# Patient Record
Sex: Female | Born: 1940
Health system: Southern US, Community
[De-identification: ages and names within clinical notes are randomized; demographics above are authoritative.]

## PROBLEM LIST (undated history)

## (undated) DIAGNOSIS — Z803 Family history of malignant neoplasm of breast: Secondary | ICD-10-CM

## (undated) DIAGNOSIS — R0600 Dyspnea, unspecified: Secondary | ICD-10-CM

## (undated) DIAGNOSIS — IMO0001 Reserved for inherently not codable concepts without codable children: Secondary | ICD-10-CM

## (undated) DIAGNOSIS — I10 Essential (primary) hypertension: Secondary | ICD-10-CM

## (undated) DIAGNOSIS — C50919 Malignant neoplasm of unspecified site of unspecified female breast: Secondary | ICD-10-CM

## (undated) DIAGNOSIS — E119 Type 2 diabetes mellitus without complications: Secondary | ICD-10-CM

## (undated) DIAGNOSIS — R519 Headache, unspecified: Secondary | ICD-10-CM

## (undated) DIAGNOSIS — K219 Gastro-esophageal reflux disease without esophagitis: Secondary | ICD-10-CM

## (undated) DIAGNOSIS — Z923 Personal history of irradiation: Secondary | ICD-10-CM

## (undated) DIAGNOSIS — R51 Headache: Secondary | ICD-10-CM

## (undated) DIAGNOSIS — IMO0002 Reserved for concepts with insufficient information to code with codable children: Secondary | ICD-10-CM

## (undated) DIAGNOSIS — C50911 Malignant neoplasm of unspecified site of right female breast: Secondary | ICD-10-CM

## (undated) DIAGNOSIS — Z8489 Family history of other specified conditions: Secondary | ICD-10-CM

## (undated) DIAGNOSIS — R0609 Other forms of dyspnea: Secondary | ICD-10-CM

## (undated) HISTORY — PX: COLONOSCOPY: SHX174

## (undated) HISTORY — DX: Essential (primary) hypertension: I10

## (undated) HISTORY — PX: BREAST LUMPECTOMY: SHX2

## (undated) HISTORY — DX: Malignant neoplasm of unspecified site of unspecified female breast: C50.919

## (undated) HISTORY — DX: Family history of malignant neoplasm of breast: Z80.3

## (undated) HISTORY — DX: Reserved for inherently not codable concepts without codable children: IMO0001

## (undated) HISTORY — DX: Dyspnea, unspecified: R06.00

## (undated) HISTORY — PX: BREAST BIOPSY: SHX20

## (undated) HISTORY — DX: Other forms of dyspnea: R06.09

## (undated) HISTORY — DX: Type 2 diabetes mellitus without complications: E11.9

## (undated) HISTORY — DX: Reserved for concepts with insufficient information to code with codable children: IMO0002

---

## 1972-04-21 HISTORY — PX: APPENDECTOMY: SHX54

## 1972-04-21 HISTORY — PX: UMBILICAL HERNIA REPAIR: SHX196

## 1981-04-21 HISTORY — PX: PARTIAL HYSTERECTOMY: SHX80

## 2000-04-21 HISTORY — PX: BREAST SURGERY: SHX581

## 2000-11-05 ENCOUNTER — Encounter: Payer: Self-pay | Admitting: Family Medicine

## 2000-11-05 ENCOUNTER — Encounter: Admission: RE | Admit: 2000-11-05 | Discharge: 2000-11-05 | Payer: Self-pay | Admitting: Family Medicine

## 2000-11-11 ENCOUNTER — Encounter: Payer: Self-pay | Admitting: Family Medicine

## 2000-11-11 ENCOUNTER — Encounter: Admission: RE | Admit: 2000-11-11 | Discharge: 2000-11-11 | Payer: Self-pay | Admitting: Family Medicine

## 2000-11-19 ENCOUNTER — Encounter (INDEPENDENT_AMBULATORY_CARE_PROVIDER_SITE_OTHER): Payer: Self-pay | Admitting: *Deleted

## 2000-11-19 ENCOUNTER — Encounter: Admission: RE | Admit: 2000-11-19 | Discharge: 2000-11-19 | Payer: Self-pay | Admitting: General Surgery

## 2000-11-19 ENCOUNTER — Ambulatory Visit (HOSPITAL_BASED_OUTPATIENT_CLINIC_OR_DEPARTMENT_OTHER): Admission: RE | Admit: 2000-11-19 | Discharge: 2000-11-19 | Payer: Self-pay | Admitting: General Surgery

## 2000-11-19 ENCOUNTER — Encounter: Payer: Self-pay | Admitting: General Surgery

## 2000-12-18 ENCOUNTER — Encounter (INDEPENDENT_AMBULATORY_CARE_PROVIDER_SITE_OTHER): Payer: Self-pay | Admitting: *Deleted

## 2000-12-18 ENCOUNTER — Ambulatory Visit (HOSPITAL_BASED_OUTPATIENT_CLINIC_OR_DEPARTMENT_OTHER): Admission: RE | Admit: 2000-12-18 | Discharge: 2000-12-18 | Payer: Self-pay | Admitting: General Surgery

## 2001-01-01 ENCOUNTER — Ambulatory Visit: Admission: RE | Admit: 2001-01-01 | Discharge: 2001-04-01 | Payer: Self-pay | Admitting: Radiation Oncology

## 2001-08-20 ENCOUNTER — Encounter: Admission: RE | Admit: 2001-08-20 | Discharge: 2001-08-20 | Payer: Self-pay | Admitting: General Surgery

## 2001-08-20 ENCOUNTER — Encounter: Payer: Self-pay | Admitting: General Surgery

## 2002-05-17 ENCOUNTER — Encounter: Payer: Self-pay | Admitting: General Surgery

## 2002-05-17 ENCOUNTER — Encounter: Admission: RE | Admit: 2002-05-17 | Discharge: 2002-05-17 | Payer: Self-pay | Admitting: General Surgery

## 2002-07-27 ENCOUNTER — Ambulatory Visit: Admission: RE | Admit: 2002-07-27 | Discharge: 2002-07-27 | Payer: Self-pay | Admitting: Family Medicine

## 2002-09-02 ENCOUNTER — Encounter: Admission: RE | Admit: 2002-09-02 | Discharge: 2002-09-02 | Payer: Self-pay | Admitting: General Surgery

## 2002-09-02 ENCOUNTER — Encounter: Payer: Self-pay | Admitting: General Surgery

## 2002-11-11 ENCOUNTER — Encounter: Admission: RE | Admit: 2002-11-11 | Discharge: 2002-11-11 | Payer: Self-pay | Admitting: Oncology

## 2002-11-11 ENCOUNTER — Encounter: Payer: Self-pay | Admitting: Oncology

## 2003-06-15 ENCOUNTER — Encounter: Admission: RE | Admit: 2003-06-15 | Discharge: 2003-06-15 | Payer: Self-pay | Admitting: General Surgery

## 2004-10-02 ENCOUNTER — Encounter: Admission: RE | Admit: 2004-10-02 | Discharge: 2004-10-02 | Payer: Self-pay | Admitting: General Surgery

## 2004-10-07 ENCOUNTER — Other Ambulatory Visit: Admission: RE | Admit: 2004-10-07 | Discharge: 2004-10-07 | Payer: Self-pay | Admitting: Family Medicine

## 2004-11-11 ENCOUNTER — Encounter: Admission: RE | Admit: 2004-11-11 | Discharge: 2004-11-11 | Payer: Self-pay | Admitting: Family Medicine

## 2006-03-04 ENCOUNTER — Encounter: Admission: RE | Admit: 2006-03-04 | Discharge: 2006-03-04 | Payer: Self-pay | Admitting: Family Medicine

## 2007-01-28 ENCOUNTER — Other Ambulatory Visit: Admission: RE | Admit: 2007-01-28 | Discharge: 2007-01-28 | Payer: Self-pay | Admitting: Family Medicine

## 2007-03-08 ENCOUNTER — Encounter: Admission: RE | Admit: 2007-03-08 | Discharge: 2007-03-08 | Payer: Self-pay | Admitting: Family Medicine

## 2007-04-22 HISTORY — PX: CATARACT EXTRACTION: SUR2

## 2008-03-08 ENCOUNTER — Encounter: Admission: RE | Admit: 2008-03-08 | Discharge: 2008-03-08 | Payer: Self-pay | Admitting: Family Medicine

## 2009-03-13 ENCOUNTER — Encounter: Admission: RE | Admit: 2009-03-13 | Discharge: 2009-03-13 | Payer: Self-pay | Admitting: Family Medicine

## 2010-03-22 ENCOUNTER — Encounter: Admission: RE | Admit: 2010-03-22 | Discharge: 2010-03-22 | Payer: Self-pay | Admitting: Family Medicine

## 2010-09-06 NOTE — Op Note (Signed)
[place]  Patient:    KIANA, HOLLAR Visit Number: [id number] MRN: [id number]          Service Type: DSU Location: [facility] Attending Physician:  Janalyn Rouse Dictated by:   Rose Phi. Maple Hudson, M.D. Proc. Date: 12/18/00 Admit Date:  12/18/2000                             Operative Report  PREOPERATIVE DIAGNOSIS:  Ductal carcinoma in situ of the left breast with a transected margin at 3 oclock position.  POSTOPERATIVE DIAGNOSIS:  Ductal carcinoma in situ of the left breast with a transected margin at 3 oclock position.  PROCEDURE:  Re-excision of biopsy site.  SURGEON: Rose Phi. Maple Hudson, M.D.  ANESTHESIA:  General.  DESCRIPTION OF PROCEDURE:  Patient placed on the operating room table and the left breast prepped and draped in the usual fashion.  The old curvilinear incision in the upper inner quadrant of the left breast was used, extending it somewhat laterally toward the 3 oclock position.  I was then able to excise the previous biopsy site with more tissue on the 3 oclock side, which I then oriented for the pathologist.  We had good hemostasis with the cautery.  I infiltrated the tissue with 0.25% Marcaine.  Subcuticular closure of 4-0 Monocryl and Steri-Strips carried out.  Dressing applied.  Patient transferred to the recovery room in satisfactory condition, having tolerated the procedure well. Dictated by:   Rose Phi. Maple Hudson, M.D. Attending Physician:  Janalyn Rouse DD:  12/18/00 TD:  12/19/00 Job: [id number]

## 2010-09-06 NOTE — Op Note (Signed)
[place]. [facility]  Patient:    Debbie Joseph, Debbie Joseph                           MRN: [id number] Proc. Date: 11/19/00 Attending:  Rose Phi. Maple Hudson, M.D.                           Operative Report  PREOPERATIVE DIAGNOSIS:  High grade ductal carcinoma in situ, left breast.  POSTOPERATIVE DIAGNOSIS:  High grade ductal carcinoma in situ, left breast.  OPERATION PERFORMED:  Left partial mastectomy with needle localization and specimen mammography.  SURGEON:  Rose Phi. Maple Hudson, M.D.  ANESTHESIA:  General.  INDICATIONS FOR PROCEDURE:  The patient had an abnormal mammogram which with microcalcifications, which on core biopsy, showed high grade DCIS without signs of invasion.  She is scheduled now for a wide excision.  DESCRIPTION OF PROCEDURE:  After suitable general anesthesia was induced, the patient was placed in supine position with the left arm extended on the arm board.  The left breast was prepped and draped in the usual fashion.  A curved incision was outlined centered on the previously placed wire in the upper inner quadrant of her left breast and incision was made and a wide excision of the wire and surrounding tissue was carried out.  Specimen mammography confirmed the removal of the previously placed clip and no other abnormalities at the margins.  Hemostasis was obtained with the cautery.  We infiltrated with 0.25% Marcaine.  Subcuticular closure of 4-0 Monocryl and Steri-Strips carried out.  Dressing applied.  The patient was transferred to the recovery room in satisfactory condition having tolerated the procedure well. DD:  11/19/00 TD:  11/19/00 Job: [id number]

## 2011-02-20 ENCOUNTER — Other Ambulatory Visit: Payer: Self-pay | Admitting: Family Medicine

## 2011-02-20 DIAGNOSIS — Z1231 Encounter for screening mammogram for malignant neoplasm of breast: Secondary | ICD-10-CM

## 2011-04-04 ENCOUNTER — Ambulatory Visit
Admission: RE | Admit: 2011-04-04 | Discharge: 2011-04-04 | Disposition: A | Discharge status: Home / Self Care | Payer: Medicare Other | Source: Ambulatory Visit | Attending: Family Medicine | Admitting: Family Medicine

## 2011-04-04 DIAGNOSIS — Z1231 Encounter for screening mammogram for malignant neoplasm of breast: Secondary | ICD-10-CM

## 2011-05-15 DIAGNOSIS — H04129 Dry eye syndrome of unspecified lacrimal gland: Secondary | ICD-10-CM | POA: Diagnosis not present

## 2011-11-04 DIAGNOSIS — H8110 Benign paroxysmal vertigo, unspecified ear: Secondary | ICD-10-CM | POA: Diagnosis not present

## 2011-11-04 DIAGNOSIS — R079 Chest pain, unspecified: Secondary | ICD-10-CM | POA: Diagnosis not present

## 2011-11-04 DIAGNOSIS — I10 Essential (primary) hypertension: Secondary | ICD-10-CM | POA: Diagnosis not present

## 2011-11-04 DIAGNOSIS — E785 Hyperlipidemia, unspecified: Secondary | ICD-10-CM | POA: Diagnosis not present

## 2011-11-20 DIAGNOSIS — I10 Essential (primary) hypertension: Secondary | ICD-10-CM | POA: Diagnosis not present

## 2011-11-20 DIAGNOSIS — E785 Hyperlipidemia, unspecified: Secondary | ICD-10-CM | POA: Diagnosis not present

## 2012-03-29 ENCOUNTER — Other Ambulatory Visit: Payer: Self-pay | Admitting: Family Medicine

## 2012-03-29 DIAGNOSIS — Z1231 Encounter for screening mammogram for malignant neoplasm of breast: Secondary | ICD-10-CM

## 2012-04-07 ENCOUNTER — Ambulatory Visit
Admission: RE | Admit: 2012-04-07 | Discharge: 2012-04-07 | Disposition: A | Discharge status: Home / Self Care | Payer: Medicare Other | Source: Ambulatory Visit | Attending: Family Medicine | Admitting: Family Medicine

## 2012-04-07 DIAGNOSIS — Z1231 Encounter for screening mammogram for malignant neoplasm of breast: Secondary | ICD-10-CM

## 2012-04-07 DIAGNOSIS — Z853 Personal history of malignant neoplasm of breast: Secondary | ICD-10-CM | POA: Diagnosis not present

## 2012-05-20 DIAGNOSIS — H04129 Dry eye syndrome of unspecified lacrimal gland: Secondary | ICD-10-CM | POA: Diagnosis not present

## 2012-06-15 DIAGNOSIS — K219 Gastro-esophageal reflux disease without esophagitis: Secondary | ICD-10-CM | POA: Diagnosis not present

## 2012-06-15 DIAGNOSIS — E785 Hyperlipidemia, unspecified: Secondary | ICD-10-CM | POA: Diagnosis not present

## 2012-06-15 DIAGNOSIS — I10 Essential (primary) hypertension: Secondary | ICD-10-CM | POA: Diagnosis not present

## 2012-06-15 DIAGNOSIS — R05 Cough: Secondary | ICD-10-CM | POA: Diagnosis not present

## 2012-08-27 DIAGNOSIS — R197 Diarrhea, unspecified: Secondary | ICD-10-CM | POA: Diagnosis not present

## 2012-12-02 DIAGNOSIS — I10 Essential (primary) hypertension: Secondary | ICD-10-CM | POA: Diagnosis not present

## 2012-12-02 DIAGNOSIS — Z1211 Encounter for screening for malignant neoplasm of colon: Secondary | ICD-10-CM | POA: Diagnosis not present

## 2012-12-02 DIAGNOSIS — Z0000 Encounter for general adult medical examination without abnormal findings: Secondary | ICD-10-CM | POA: Diagnosis not present

## 2012-12-02 DIAGNOSIS — E785 Hyperlipidemia, unspecified: Secondary | ICD-10-CM | POA: Diagnosis not present

## 2013-03-08 ENCOUNTER — Other Ambulatory Visit: Payer: Self-pay

## 2013-03-08 DIAGNOSIS — Z1231 Encounter for screening mammogram for malignant neoplasm of breast: Secondary | ICD-10-CM

## 2013-04-07 DIAGNOSIS — B9789 Other viral agents as the cause of diseases classified elsewhere: Secondary | ICD-10-CM | POA: Diagnosis not present

## 2013-04-07 DIAGNOSIS — R05 Cough: Secondary | ICD-10-CM | POA: Diagnosis not present

## 2013-04-08 ENCOUNTER — Ambulatory Visit: Payer: Medicare Other

## 2013-04-12 ENCOUNTER — Ambulatory Visit
Admission: RE | Admit: 2013-04-12 | Discharge: 2013-04-12 | Disposition: A | Discharge status: Home / Self Care | Payer: Medicare Other | Source: Ambulatory Visit

## 2013-04-12 DIAGNOSIS — Z1231 Encounter for screening mammogram for malignant neoplasm of breast: Secondary | ICD-10-CM | POA: Diagnosis not present

## 2013-04-21 HISTORY — PX: OTHER SURGICAL HISTORY: SHX169

## 2013-05-23 DIAGNOSIS — H40019 Open angle with borderline findings, low risk, unspecified eye: Secondary | ICD-10-CM | POA: Diagnosis not present

## 2013-05-23 DIAGNOSIS — H04129 Dry eye syndrome of unspecified lacrimal gland: Secondary | ICD-10-CM | POA: Diagnosis not present

## 2013-10-18 DIAGNOSIS — J40 Bronchitis, not specified as acute or chronic: Secondary | ICD-10-CM | POA: Diagnosis not present

## 2013-11-28 DIAGNOSIS — R0602 Shortness of breath: Secondary | ICD-10-CM | POA: Diagnosis not present

## 2013-11-28 DIAGNOSIS — Z0000 Encounter for general adult medical examination without abnormal findings: Secondary | ICD-10-CM | POA: Diagnosis not present

## 2013-11-28 DIAGNOSIS — C50919 Malignant neoplasm of unspecified site of unspecified female breast: Secondary | ICD-10-CM | POA: Diagnosis not present

## 2013-11-28 DIAGNOSIS — Z23 Encounter for immunization: Secondary | ICD-10-CM | POA: Diagnosis not present

## 2013-11-28 DIAGNOSIS — I10 Essential (primary) hypertension: Secondary | ICD-10-CM | POA: Diagnosis not present

## 2013-11-28 DIAGNOSIS — E785 Hyperlipidemia, unspecified: Secondary | ICD-10-CM | POA: Diagnosis not present

## 2013-12-08 ENCOUNTER — Encounter: Payer: Self-pay | Admitting: Cardiovascular Disease

## 2013-12-08 ENCOUNTER — Ambulatory Visit (INDEPENDENT_AMBULATORY_CARE_PROVIDER_SITE_OTHER): Payer: Medicare Other | Admitting: Cardiovascular Disease

## 2013-12-08 VITALS — BP 172/86 | HR 84 | Ht 65.0 in | Wt 182.8 lb

## 2013-12-08 DIAGNOSIS — R0989 Other specified symptoms and signs involving the circulatory and respiratory systems: Secondary | ICD-10-CM

## 2013-12-08 DIAGNOSIS — I10 Essential (primary) hypertension: Secondary | ICD-10-CM | POA: Diagnosis not present

## 2013-12-08 DIAGNOSIS — R0609 Other forms of dyspnea: Secondary | ICD-10-CM | POA: Insufficient documentation

## 2013-12-08 NOTE — Assessment & Plan Note (Signed)
Notably increased dyspnea on exertion over the last 12 months

## 2013-12-08 NOTE — Assessment & Plan Note (Signed)
Poorly controlled on current medications
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
## 2013-12-08 NOTE — Patient Instructions (Signed)
  We will see you back in follow up in after tests  Dr Gwenlyn Found has ordered: 1.  Echocardiogram. Echocardiography is a painless test that uses sound waves to create images of your heart. It provides your doctor with information about the size and shape of your heart and how well your heart's chambers and valves are working. This procedure takes approximately one hour. There are no restrictions for this procedure.   2. renal artery duplex- During this test, an ultrasound is used to evaluate blood flow to the kidneys. Allow one hour for this exam. Do not eat after midnight the day before and avoid carbonated beverages. Take your medications as you usually do.  3. Your physician has requested that you have an exercise tolerance test. For further information please visit [email or web address]. Please also follow instruction sheet, as given.

## 2013-12-15 ENCOUNTER — Ambulatory Visit (HOSPITAL_COMMUNITY)
Admission: RE | Admit: 2013-12-15 | Discharge: 2013-12-15 | Disposition: A | Discharge status: Home / Self Care | Payer: Medicare Other | Source: Ambulatory Visit | Attending: Cardiovascular Disease | Admitting: Cardiovascular Disease

## 2013-12-15 DIAGNOSIS — R0609 Other forms of dyspnea: Secondary | ICD-10-CM | POA: Diagnosis not present

## 2013-12-15 DIAGNOSIS — I10 Essential (primary) hypertension: Secondary | ICD-10-CM | POA: Diagnosis not present

## 2013-12-15 DIAGNOSIS — R0989 Other specified symptoms and signs involving the circulatory and respiratory systems: Secondary | ICD-10-CM | POA: Insufficient documentation

## 2013-12-15 DIAGNOSIS — I369 Nonrheumatic tricuspid valve disorder, unspecified: Secondary | ICD-10-CM

## 2013-12-15 NOTE — Progress Notes (Signed)
2D Echo Performed 12/15/2013    Ellaree Gear, RCS  

## 2013-12-15 NOTE — Progress Notes (Signed)
Renal Artery Duplex Completed. °Brianna L Mazza,RVT °

## 2013-12-19 ENCOUNTER — Encounter: Payer: Self-pay | Admitting: *Deleted

## 2013-12-20 ENCOUNTER — Telehealth (HOSPITAL_COMMUNITY): Payer: Self-pay

## 2013-12-20 NOTE — Telephone Encounter (Signed)
Encounter complete. 

## 2013-12-21 ENCOUNTER — Encounter: Payer: Self-pay | Admitting: *Deleted

## 2013-12-23 ENCOUNTER — Encounter: Payer: Self-pay | Admitting: *Deleted

## 2013-12-23 ENCOUNTER — Ambulatory Visit (HOSPITAL_COMMUNITY)
Admission: RE | Admit: 2013-12-23 | Discharge: 2013-12-23 | Disposition: A | Discharge status: Home / Self Care | Payer: Medicare Other | Source: Ambulatory Visit | Attending: Cardiovascular Disease | Admitting: Cardiovascular Disease

## 2013-12-23 DIAGNOSIS — I10 Essential (primary) hypertension: Secondary | ICD-10-CM | POA: Diagnosis not present

## 2013-12-23 DIAGNOSIS — R0609 Other forms of dyspnea: Secondary | ICD-10-CM | POA: Insufficient documentation

## 2013-12-23 DIAGNOSIS — R0989 Other specified symptoms and signs involving the circulatory and respiratory systems: Secondary | ICD-10-CM | POA: Insufficient documentation

## 2013-12-23 NOTE — Procedures (Signed)
Exercise Treadmill Test  Pre-Exercise Testing Evaluation NSR, normal tracing  Test  Exercise Tolerance Test Ordering MD: Lorretta Harp    Unique Test No: 1   Treadmill:  1  Indication for ETT: exertional dyspnea  Contraindication to ETT: No   Stress Modality: exercise - treadmill  Cardiac Imaging Performed: non   Protocol: standard Bruce - maximal  Max BP:  143/110  Max MPHR (bpm):  149 85% MPR (bpm):  126  MPHR obtained (bpm):  129 % MPHR obtained:  86  Reached 85% MPHR (min:sec):  4:38 Total Exercise Time (min-sec):  4:38  Workload in METS:  6.5 Borg Scale: 15  Reason ETT Terminated:  Diastolic Pressure, SOB and Patient request    ST Segment Analysis At Rest: normal ST segments - no evidence of significant ST depression With Exercise: no evidence of significant ST depression  Other Information Arrhythmia:  No Angina during ETT:  absent (0) Quality of ETT:  diagnostic  ETT Interpretation:  normal - no evidence of ischemia by ST analysis  Comments: Fair exercise tolerance. Hypertensive response to exercise  Sanda Klein, MD, [facility] [phone] office [phone] pager

## 2013-12-28 DIAGNOSIS — Z23 Encounter for immunization: Secondary | ICD-10-CM | POA: Diagnosis not present

## 2014-02-08 ENCOUNTER — Ambulatory Visit (INDEPENDENT_AMBULATORY_CARE_PROVIDER_SITE_OTHER): Payer: Medicare Other | Admitting: Cardiovascular Disease

## 2014-02-08 ENCOUNTER — Encounter: Payer: Self-pay | Admitting: Cardiovascular Disease

## 2014-02-08 VITALS — BP 180/78 | HR 88 | Ht 65.0 in | Wt 187.0 lb

## 2014-02-08 DIAGNOSIS — Z789 Other specified health status: Secondary | ICD-10-CM

## 2014-02-08 DIAGNOSIS — Z9189 Other specified personal risk factors, not elsewhere classified: Secondary | ICD-10-CM

## 2014-02-08 MED ORDER — HYDROCHLOROTHIAZIDE 12.5 MG PO CAPS: 12.5000 mg | ORAL_CAPSULE | Freq: Every day | ORAL | Status: DC
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
## 2014-02-08 NOTE — Patient Instructions (Signed)
  We will see you back in follow up in 1 month with [facility] for blood pressure check; 3 months with an extender; and 6 months with Dr Gwenlyn Found.  Dr Gwenlyn Found has ordered: 1. Start Hydrochlorothiazide 12.5mg  daily  2. Have blood work done in 2 weeks (1st floor; [address])

## 2014-02-08 NOTE — Assessment & Plan Note (Signed)
She is on losartan 50 mg a day. When this was doubled to 100 she had side effects. She has checked her blood pressure at home on daily basis which reveal white fluctuate fluctuations of blood pressures in the 37/01/6268 range systolic altering her blood pressures in the 160-180 range. She has been putting more attention to her salt intake. Under her on hydrochlorothiazide 12 mg in addition to her losartan. She'll keep daily blood pressure log, we will check labs in 3 weeks and she will see Erasmo Downer back in the office in one month for followup. Of note, her renal Doppler studies were normal ruling out renal vascular hypertension.

## 2014-02-08 NOTE — Assessment & Plan Note (Signed)
A 2-D echocardiogram was performed and was entirely normal. Ejection fraction was normal and there were no valvular abnormalities nor was there evidence of lethargy or hypertrophy

## 2014-02-22 DIAGNOSIS — Z789 Other specified health status: Secondary | ICD-10-CM | POA: Diagnosis not present

## 2014-02-22 LAB — BASIC METABOLIC PANEL
BUN: 18 mg/dL (ref 6–23)
CO2: 27 meq/L (ref 19–32)
CREATININE: 0.76 mg/dL (ref 0.50–1.10)
Calcium: 9.3 mg/dL (ref 8.4–10.5)
Chloride: 103 mEq/L (ref 96–112)
GLUCOSE: 103 mg/dL — AB (ref 70–99)
Potassium: 4.1 mEq/L (ref 3.5–5.3)
SODIUM: 141 meq/L (ref 135–145)

## 2014-03-06 ENCOUNTER — Telehealth: Payer: Self-pay | Admitting: Pharmacist Clinician (PhC)/ Clinical Pharmacy Specialist

## 2014-03-10 ENCOUNTER — Encounter: Payer: Medicare Other | Admitting: Pharmacist Clinician (PhC)/ Clinical Pharmacy Specialist

## 2014-03-10 ENCOUNTER — Other Ambulatory Visit: Payer: Self-pay

## 2014-03-10 DIAGNOSIS — Z1231 Encounter for screening mammogram for malignant neoplasm of breast: Secondary | ICD-10-CM

## 2014-03-14 NOTE — Telephone Encounter (Signed)
Closed encounter °

## 2014-03-15 ENCOUNTER — Ambulatory Visit (INDEPENDENT_AMBULATORY_CARE_PROVIDER_SITE_OTHER): Payer: Medicare Other | Admitting: Pharmacist Clinician (PhC)/ Clinical Pharmacy Specialist

## 2014-03-15 VITALS — BP 132/74 | HR 68 | Ht 65.0 in | Wt 185.1 lb

## 2014-03-15 DIAGNOSIS — I10 Essential (primary) hypertension: Secondary | ICD-10-CM | POA: Diagnosis not present

## 2014-03-15 NOTE — Patient Instructions (Signed)
Your blood pressure today is 132/74  (goal is <150/90)  Check your blood pressure at home several times each week and keep record of the readings.  Take your BP meds as follows: continue with losartan-hctz 50/12.5 once daily  Bring all of your meds, your BP cuff and your record of home blood pressures to your next appointment.  Exercise as you're able, try to walk approximately 30 minutes per day.  Keep salt intake to a minimum, especially watch canned and prepared boxed foods.  Eat more fresh fruits and vegetables and fewer canned items.  Avoid eating in fast food restaurants.    HOW TO TAKE YOUR BLOOD PRESSURE: . Rest 5 minutes before taking your blood pressure. .  Don't smoke or drink caffeinated beverages for at least 30 minutes before. . Take your blood pressure before (not after) you eat. . Sit comfortably with your back supported and both feet on the floor (don't cross your legs). . Elevate your arm to heart level on a table or a desk. . Use the proper sized cuff. It should fit smoothly and snugly around your bare upper arm. There should be enough room to slip a fingertip under the cuff. The bottom edge of the cuff should be 1 inch above the crease of the elbow. . Ideally, take 3 measurements at one sitting and record the average.

## 2014-03-20 MED ORDER — LOSARTAN POTASSIUM-HCTZ 50-12.5 MG PO TABS
1.0000 | ORAL_TABLET | Freq: Every day | ORAL | Status: DC
Start: 2014-03-20 — End: 2014-10-09

## 2014-03-20 NOTE — Progress Notes (Signed)
     03/21/2014 Debbie Joseph 09/24/1940 [id number]   HPI:  Debbie Joseph is a 73 y.o. female patient of Dr Gwenlyn Found, with a PMH below who presents today for hypertension clinic evaluation.  She has had hypertension for several years and has a strong family history as well.  She previously was on lisinopril but developed a cough over time.  She was switched to losartan 50mg  then increased to 100mg .  At the 100mg  dose she had some problems and then dose was decreased back to 50 mg with the addition of hctz 12.5mg .  Pt reports feeling fine, she is unsure if the sickness she felt was related to the increase in losartan or not.   Cardiac Hx: hypertension  Family Hx: mother's family has strong history of hypertension and several aunts/uncles died from apparent sudden cardiac death, mother had stroke at age 19, lived to 79  Social Hx:  No tobacco or alcohol.  Usually 1-2 cups of coffee/tea per day.    Diet: pt states very aware of salt - reads food labels, tries to keep everything low sodium.    Exercise: no regular exercise, does stay active with housework  Home BP readings:  Since starting losartan/hctz has had only 3 days (out of 34) with readings >979 systolic.  Diastolic all WNL, systolic average 480  Current antihypertensive medications: losartan hct 50/12.5  Current Outpatient Prescriptions  Medication Sig Dispense Refill  . losartan-hydrochlorothiazide (HYZAAR) 50-12.5 MG per tablet Take 1 tablet by mouth daily. 90 tablet 1  . omeprazole (PRILOSEC) 20 MG capsule Take 20 mg by mouth daily.     No current facility-administered medications for this visit.    Allergies  Allergen Reactions  . Evista [Raloxifene]     Past Medical History  Diagnosis Date  . Hypertension   . Breast cancer   . Dyspnea on exertion     Blood pressure 132/74, pulse 68, height 5\' 5"  (1.651 m), weight 185 lb 1.6 oz (83.961 kg). Standing 128/80    Tommy Medal PharmD CPP [facility]

## 2014-03-21 ENCOUNTER — Encounter: Payer: Self-pay | Admitting: Pharmacist Clinician (PhC)/ Clinical Pharmacy Specialist

## 2014-03-21 NOTE — Assessment & Plan Note (Addendum)
Today Debbie Joseph's blood pressure is much improved.  She is doing well and reports no side effects from the medications.  She did not bring her blood pressure cuff into the office for validation, but her home reading this am was within 5 points of the office reading (hers was actually higher).  I have asked her to continue with her daily blood pressure checks at home and she is to call if the systolic readings trend to >150.  I combined her two medications into one tablet for convenience.  She is to continue with her low sodium diet and I encouraged her to go for daily walks or find some sort exercise in addition to just housework.

## 2014-03-22 ENCOUNTER — Telehealth: Payer: Self-pay | Admitting: Cardiology

## 2014-03-22 NOTE — Telephone Encounter (Signed)
Close encounter 

## 2014-04-17 ENCOUNTER — Ambulatory Visit
Admission: RE | Admit: 2014-04-17 | Discharge: 2014-04-17 | Disposition: A | Discharge status: Home / Self Care | Payer: Medicare Other | Source: Ambulatory Visit

## 2014-04-17 DIAGNOSIS — Z1231 Encounter for screening mammogram for malignant neoplasm of breast: Secondary | ICD-10-CM | POA: Diagnosis not present

## 2014-04-19 ENCOUNTER — Other Ambulatory Visit: Payer: Self-pay | Admitting: Family Medicine

## 2014-04-19 DIAGNOSIS — R928 Other abnormal and inconclusive findings on diagnostic imaging of breast: Secondary | ICD-10-CM

## 2014-04-26 ENCOUNTER — Ambulatory Visit (INDEPENDENT_AMBULATORY_CARE_PROVIDER_SITE_OTHER): Payer: Medicare Other | Admitting: Cardiology

## 2014-04-26 ENCOUNTER — Encounter: Payer: Self-pay | Admitting: Cardiology

## 2014-04-26 VITALS — BP 142/86 | HR 89 | Ht 65.0 in | Wt 187.1 lb

## 2014-04-26 DIAGNOSIS — I10 Essential (primary) hypertension: Secondary | ICD-10-CM

## 2014-04-26 NOTE — Patient Instructions (Signed)
Your physician wants you to follow-up in: 6 Months with Dr Berry. You will receive a reminder letter in the mail two months in advance. If you don't receive a letter, please call our office to schedule the follow-up appointment.    

## 2014-04-26 NOTE — Assessment & Plan Note (Signed)
Improved control.  Continue current meds  Follow up with Dr. Adora Fridge in 6 months.

## 2014-04-26 NOTE — Progress Notes (Signed)
04/26/2014   PCP: Shirline Frees, MD   Chief Complaint  Patient presents with  . Follow-up    3  month follow up, Pt. denies all other symptoms.    Primary Cardiologist:Dr. Adora Fridge   HPI:  74 year old moderately overweight married Caucasian female mother of 2 children. She was referred by Dr. Kenton Kingfisher for cardiovascular evaluation because of poorly controlled blood pressure and increasing dyspnea on exertion. She has a history of blood pressure for years. She does not watch her salt intake. There are no other cardiovascular risk factors. She does not have a family history of heart disease. She has never had a heart attack or stroke. She denies chest pain but does relate a history of increasing dyspnea on exertion over the last 12 months. When Dr. Kenton Kingfisher increased the losartan from 50-100 mg her blood pressure was better controlled although she was symptomatic from this. Dr. Adora Fridge  saw her back 12/08/13 and  performed a 2-D echo that was entirely normal except for grade 1 diastolic dysfunction.  Renal Doppler studies that ruled out renal vascular disease. She has kept it out of her blood pressure fairly closely revealing fluctuating blood pressures with systolics running in the 84/69/6295 range up to the 160-180 range. She now has close watch on her salt intake as well.  Today she is back for 6 months follow up.  She has no complaints, no chest pain, some DOE with walking up stairs.  We discussed exercise and the importance.  She has trouble committing to the exercise.   Her BP has been better now on ARB and diuretic.  Average 134-145/75.  Her stress test in Sept without ischemic changes but hypertensive response to exercise.  Recent abnormal mammogram to have it rechecked.      Allergies  Allergen Reactions  . Evista [Raloxifene]     Current Outpatient Prescriptions  Medication Sig Dispense Refill  . losartan-hydrochlorothiazide (HYZAAR) 50-12.5 MG per tablet Take 1  tablet by mouth daily. 90 tablet 1  . omeprazole (PRILOSEC) 20 MG capsule Take 20 mg by mouth daily.    . RED YEAST RICE EXTRACT PO Take by mouth.     No current facility-administered medications for this visit.    Past Medical History  Diagnosis Date  . Hypertension   . Breast cancer   . Dyspnea on exertion     Past Surgical History  Procedure Laterality Date  . Partial hysterectomy  1983  . Umbilical hernia repair  1974  . Breast surgery  2002    MWU:[id number]:UU colds or fevers, mild weight increase Skin:no rashes or ulcers HEENT:no blurred vision, no congestion CV:see HPI PUL:see HPI GI:no diarrhea constipation or melena, no indigestion GU:no hematuria, no dysuria MS:no joint pain, no claudication Neuro:no syncope, no lightheadedness Endo:no diabetes, no thyroid disease  Wt Readings from Last 3 Encounters:  04/26/14 187 lb 1.6 oz (84.868 kg)  03/21/14 185 lb 1.6 oz (83.961 kg)  02/08/14 187 lb (84.823 kg)    PHYSICAL EXAM BP 142/86 mmHg  Pulse 89  Ht 5\' 5"  (1.651 m)  Wt 187 lb 1.6 oz (84.868 kg)  BMI 31.14 kg/m2 General:Pleasant affect, NAD Skin:Warm and dry, brisk capillary refill HEENT:normocephalic, sclera clear, mucus membranes moist Neck:supple, no JVD, no bruits  Heart:S1S2 RRR without murmur, gallup, rub or click Lungs:clear without rales, rhonchi, or wheezes VOZ:DGUY, non tender, + BS, do not palpate liver spleen or masses Ext:no lower ext edema, 2+  pedal pulses, 2+ radial pulses Neuro:alert and oriented, MAE, follows commands, + facial symmetry OKH:TXHFSF SR no changes from previous. Normal EKG  ASSESSMENT AND PLAN Essential hypertension Improved control.  Continue current meds  Follow up with Dr. Adora Fridge in 6 months.

## 2014-04-28 ENCOUNTER — Other Ambulatory Visit: Payer: Self-pay | Admitting: Family Medicine

## 2014-04-28 DIAGNOSIS — R928 Other abnormal and inconclusive findings on diagnostic imaging of breast: Secondary | ICD-10-CM

## 2014-04-28 DIAGNOSIS — Z9889 Other specified postprocedural states: Secondary | ICD-10-CM

## 2014-05-01 ENCOUNTER — Ambulatory Visit
Admission: RE | Admit: 2014-05-01 | Discharge: 2014-05-01 | Disposition: A | Discharge status: Home / Self Care | Payer: Medicare Other | Source: Ambulatory Visit | Attending: Family Medicine | Admitting: Family Medicine

## 2014-05-01 ENCOUNTER — Other Ambulatory Visit: Payer: Self-pay | Admitting: Family Medicine

## 2014-05-01 DIAGNOSIS — R928 Other abnormal and inconclusive findings on diagnostic imaging of breast: Secondary | ICD-10-CM

## 2014-05-01 DIAGNOSIS — D0511 Intraductal carcinoma in situ of right breast: Secondary | ICD-10-CM | POA: Diagnosis not present

## 2014-05-01 DIAGNOSIS — R921 Mammographic calcification found on diagnostic imaging of breast: Secondary | ICD-10-CM | POA: Diagnosis not present

## 2014-05-01 DIAGNOSIS — Z9889 Other specified postprocedural states: Secondary | ICD-10-CM

## 2014-05-02 ENCOUNTER — Ambulatory Visit
Admission: RE | Admit: 2014-05-02 | Discharge: 2014-05-02 | Disposition: A | Discharge status: Home / Self Care | Payer: Medicare Other | Source: Ambulatory Visit | Attending: Family Medicine | Admitting: Family Medicine

## 2014-05-02 ENCOUNTER — Other Ambulatory Visit: Payer: Self-pay | Admitting: Family Medicine

## 2014-05-02 DIAGNOSIS — C50511 Malignant neoplasm of lower-outer quadrant of right female breast: Secondary | ICD-10-CM | POA: Diagnosis not present

## 2014-05-02 DIAGNOSIS — C50911 Malignant neoplasm of unspecified site of right female breast: Secondary | ICD-10-CM

## 2014-05-02 DIAGNOSIS — R928 Other abnormal and inconclusive findings on diagnostic imaging of breast: Secondary | ICD-10-CM

## 2014-05-04 ENCOUNTER — Ambulatory Visit
Admission: RE | Admit: 2014-05-04 | Discharge: 2014-05-04 | Disposition: A | Discharge status: Home / Self Care | Payer: Medicare Other | Source: Ambulatory Visit | Attending: Family Medicine | Admitting: Family Medicine

## 2014-05-04 DIAGNOSIS — C50511 Malignant neoplasm of lower-outer quadrant of right female breast: Secondary | ICD-10-CM | POA: Diagnosis not present

## 2014-05-04 DIAGNOSIS — C50911 Malignant neoplasm of unspecified site of right female breast: Secondary | ICD-10-CM

## 2014-05-04 MED ORDER — GADOBENATE DIMEGLUMINE 529 MG/ML IV SOLN: 17.0000 mL | Freq: Once | INTRAVENOUS | Status: AC | PRN

## 2014-05-05 ENCOUNTER — Encounter (HOSPITAL_BASED_OUTPATIENT_CLINIC_OR_DEPARTMENT_OTHER): Payer: Self-pay | Admitting: *Deleted

## 2014-05-05 ENCOUNTER — Other Ambulatory Visit (INDEPENDENT_AMBULATORY_CARE_PROVIDER_SITE_OTHER): Payer: Self-pay | Admitting: General Surgery

## 2014-05-05 DIAGNOSIS — D0511 Intraductal carcinoma in situ of right breast: Secondary | ICD-10-CM | POA: Diagnosis not present

## 2014-05-05 NOTE — Progress Notes (Signed)
Pt added on Monday late, but has to go for NL 1000. Will come here after NL-surgery not until 1415-needs bmet-stat ekg done 04/26/14 dr berry-manages her htn-no cardiac-stress and echo 9/15

## 2014-05-08 ENCOUNTER — Encounter (HOSPITAL_BASED_OUTPATIENT_CLINIC_OR_DEPARTMENT_OTHER)
Admission: RE | Disposition: A | Discharge status: Home / Self Care | Payer: Self-pay | Source: Ambulatory Visit | Attending: General Surgery

## 2014-05-08 ENCOUNTER — Ambulatory Visit (HOSPITAL_BASED_OUTPATIENT_CLINIC_OR_DEPARTMENT_OTHER): Payer: Medicare Other | Admitting: Anesthesiology

## 2014-05-08 ENCOUNTER — Ambulatory Visit
Admission: RE | Admit: 2014-05-08 | Discharge: 2014-05-08 | Disposition: A | Discharge status: Home / Self Care | Payer: Medicare Other | Source: Ambulatory Visit | Attending: General Surgery | Admitting: General Surgery

## 2014-05-08 ENCOUNTER — Ambulatory Visit
Admit: 2014-05-08 | Discharge: 2014-05-08 | Disposition: A | Discharge status: Home / Self Care | Payer: Medicare Other | Attending: General Surgery | Admitting: General Surgery

## 2014-05-08 ENCOUNTER — Ambulatory Visit (HOSPITAL_BASED_OUTPATIENT_CLINIC_OR_DEPARTMENT_OTHER)
Admission: RE | Admit: 2014-05-08 | Discharge: 2014-05-08 | Disposition: A | Discharge status: Home / Self Care | Payer: Medicare Other | Source: Ambulatory Visit | Attending: General Surgery | Admitting: General Surgery

## 2014-05-08 ENCOUNTER — Encounter (HOSPITAL_BASED_OUTPATIENT_CLINIC_OR_DEPARTMENT_OTHER): Payer: Self-pay | Admitting: *Deleted

## 2014-05-08 DIAGNOSIS — R921 Mammographic calcification found on diagnostic imaging of breast: Secondary | ICD-10-CM | POA: Diagnosis not present

## 2014-05-08 DIAGNOSIS — I10 Essential (primary) hypertension: Secondary | ICD-10-CM | POA: Insufficient documentation

## 2014-05-08 DIAGNOSIS — G43909 Migraine, unspecified, not intractable, without status migrainosus: Secondary | ICD-10-CM | POA: Insufficient documentation

## 2014-05-08 DIAGNOSIS — K219 Gastro-esophageal reflux disease without esophagitis: Secondary | ICD-10-CM | POA: Diagnosis not present

## 2014-05-08 DIAGNOSIS — D0511 Intraductal carcinoma in situ of right breast: Secondary | ICD-10-CM | POA: Diagnosis not present

## 2014-05-08 HISTORY — PX: BREAST LUMPECTOMY WITH NEEDLE LOCALIZATION: SHX5759

## 2014-05-08 HISTORY — DX: Gastro-esophageal reflux disease without esophagitis: K21.9

## 2014-05-08 LAB — POCT I-STAT, CHEM 8
BUN: 25 mg/dL — ABNORMAL HIGH (ref 6–23)
CALCIUM ION: 1.16 mmol/L (ref 1.13–1.30)
CHLORIDE: 105 meq/L (ref 96–112)
CREATININE: 0.7 mg/dL (ref 0.50–1.10)
Glucose, Bld: 119 mg/dL — ABNORMAL HIGH (ref 70–99)
HEMATOCRIT: 44 % (ref 36.0–46.0)
HEMOGLOBIN: 15 g/dL (ref 12.0–15.0)
Potassium: 3.7 mmol/L (ref 3.5–5.1)
SODIUM: 141 mmol/L (ref 135–145)
TCO2: 23 mmol/L (ref 0–100)

## 2014-05-08 SURGERY — BREAST LUMPECTOMY WITH NEEDLE LOCALIZATION
Anesthesia: General | Site: Breast | Laterality: Right

## 2014-05-08 MED ORDER — BUPIVACAINE-EPINEPHRINE (PF) 0.5% -1:200000 IJ SOLN
INTRAMUSCULAR | Status: AC
  Filled 2014-05-08: qty 30

## 2014-05-08 MED ORDER — HYDROMORPHONE HCL 1 MG/ML IJ SOLN: 0.2500 mg | INTRAMUSCULAR | Status: DC | PRN

## 2014-05-08 MED ORDER — BUPIVACAINE-EPINEPHRINE 0.5% -1:200000 IJ SOLN
INTRAMUSCULAR | Status: DC | PRN
  Administered 2014-05-08: 30 mL

## 2014-05-08 MED ORDER — CHLORHEXIDINE GLUCONATE 4 % EX LIQD: 1.0000 "application " | Freq: Once | CUTANEOUS | Status: DC

## 2014-05-08 MED ORDER — CEFAZOLIN SODIUM-DEXTROSE 2-3 GM-% IV SOLR
INTRAVENOUS | Status: AC
  Filled 2014-05-08: qty 50

## 2014-05-08 MED ORDER — EPHEDRINE SULFATE 50 MG/ML IJ SOLN
INTRAMUSCULAR | Status: DC | PRN
  Administered 2014-05-08: 10 mg via INTRAVENOUS

## 2014-05-08 MED ORDER — FENTANYL CITRATE 0.05 MG/ML IJ SOLN: 50.0000 ug | INTRAMUSCULAR | Status: DC | PRN

## 2014-05-08 MED ORDER — FENTANYL CITRATE 0.05 MG/ML IJ SOLN
INTRAMUSCULAR | Status: AC
  Filled 2014-05-08: qty 4

## 2014-05-08 MED ORDER — LIDOCAINE HCL (PF) 1 % IJ SOLN
INTRAMUSCULAR | Status: AC
  Filled 2014-05-08: qty 30

## 2014-05-08 MED ORDER — LIDOCAINE HCL (CARDIAC) 20 MG/ML IV SOLN
INTRAVENOUS | Status: DC | PRN
  Administered 2014-05-08: 50 mg via INTRAVENOUS

## 2014-05-08 MED ORDER — MIDAZOLAM HCL 2 MG/2ML IJ SOLN: 1.0000 mg | INTRAMUSCULAR | Status: DC | PRN

## 2014-05-08 MED ORDER — OXYCODONE HCL 5 MG PO TABS: 5.0000 mg | ORAL_TABLET | Freq: Once | ORAL | Status: DC | PRN

## 2014-05-08 MED ORDER — OXYCODONE HCL 5 MG/5ML PO SOLN: 5.0000 mg | Freq: Once | ORAL | Status: DC | PRN

## 2014-05-08 MED ORDER — CEFAZOLIN SODIUM-DEXTROSE 2-3 GM-% IV SOLR
2.0000 g | INTRAVENOUS | Status: AC
  Administered 2014-05-08: 2 g via INTRAVENOUS

## 2014-05-08 MED ORDER — CEFAZOLIN SODIUM 1-5 GM-% IV SOLN
INTRAVENOUS | Status: AC
  Filled 2014-05-08: qty 50

## 2014-05-08 MED ORDER — SODIUM BICARBONATE 4 % IV SOLN
INTRAVENOUS | Status: AC
  Filled 2014-05-08: qty 5

## 2014-05-08 MED ORDER — MIDAZOLAM HCL 5 MG/5ML IJ SOLN
INTRAMUSCULAR | Status: DC | PRN
  Administered 2014-05-08: 1 mg via INTRAVENOUS

## 2014-05-08 MED ORDER — MIDAZOLAM HCL 2 MG/2ML IJ SOLN
INTRAMUSCULAR | Status: AC
  Filled 2014-05-08: qty 2

## 2014-05-08 MED ORDER — LACTATED RINGERS IV SOLN
INTRAVENOUS | Status: DC
  Administered 2014-05-08 (×2): via INTRAVENOUS

## 2014-05-08 MED ORDER — PROPOFOL 10 MG/ML IV BOLUS
INTRAVENOUS | Status: DC | PRN
  Administered 2014-05-08: 200 mg via INTRAVENOUS

## 2014-05-08 MED ORDER — ONDANSETRON HCL 4 MG/2ML IJ SOLN: 4.0000 mg | Freq: Once | INTRAMUSCULAR | Status: DC | PRN

## 2014-05-08 MED ORDER — HYDROCODONE-ACETAMINOPHEN 5-325 MG PO TABS: 1.0000 | ORAL_TABLET | ORAL | Status: DC | PRN

## 2014-05-08 MED ORDER — FENTANYL CITRATE 0.05 MG/ML IJ SOLN
INTRAMUSCULAR | Status: DC | PRN
  Administered 2014-05-08: 50 ug via INTRAVENOUS
  Administered 2014-05-08: 25 ug via INTRAVENOUS

## 2014-05-08 SURGICAL SUPPLY — 45 items
BLADE SURG 10 STRL SS (BLADE) IMPLANT
BLADE SURG 15 STRL LF DISP TIS (BLADE) ×1 IMPLANT
BLADE SURG 15 STRL SS (BLADE) ×3
CANISTER SUCT 1200ML W/VALVE (MISCELLANEOUS) IMPLANT
CHLORAPREP W/TINT 26ML (MISCELLANEOUS) ×3 IMPLANT
CLIP TI MEDIUM 6 (CLIP) ×4 IMPLANT
CLIP TI WIDE RED SMALL 6 (CLIP) IMPLANT
COVER BACK TABLE 60X90IN (DRAPES) ×3 IMPLANT
COVER MAYO STAND STRL (DRAPES) ×3 IMPLANT
DEVICE DUBIN W/COMP PLATE 8390 (MISCELLANEOUS) ×2 IMPLANT
DRAPE LAPAROTOMY 100X72 PEDS (DRAPES) ×3 IMPLANT
DRAPE UTILITY XL STRL (DRAPES) ×3 IMPLANT
ELECT COATED BLADE 2.86 ST (ELECTRODE) ×3 IMPLANT
ELECT REM PT RETURN 9FT ADLT (ELECTROSURGICAL) ×3
ELECTRODE REM PT RTRN 9FT ADLT (ELECTROSURGICAL) ×1 IMPLANT
GLOVE BIOGEL PI IND STRL 8 (GLOVE) ×1 IMPLANT
GLOVE BIOGEL PI INDICATOR 8 (GLOVE) ×2
GLOVE ECLIPSE 7.5 STRL STRAW (GLOVE) ×5 IMPLANT
GLOVE SURG SS PI 7.0 STRL IVOR (GLOVE) ×2 IMPLANT
GOWN STRL REUS W/ TWL LRG LVL3 (GOWN DISPOSABLE) ×1 IMPLANT
GOWN STRL REUS W/ TWL XL LVL3 (GOWN DISPOSABLE) ×1 IMPLANT
GOWN STRL REUS W/TWL LRG LVL3 (GOWN DISPOSABLE) ×3
GOWN STRL REUS W/TWL XL LVL3 (GOWN DISPOSABLE) ×3
KIT MARKER MARGIN INK (KITS) ×2 IMPLANT
LIQUID BAND (GAUZE/BANDAGES/DRESSINGS) IMPLANT
NDL HYPO 25X1 1.5 SAFETY (NEEDLE) ×1 IMPLANT
NEEDLE HYPO 25X1 1.5 SAFETY (NEEDLE) ×3 IMPLANT
NS IRRIG 1000ML POUR BTL (IV SOLUTION) ×3 IMPLANT
PACK BASIN DAY SURGERY FS (CUSTOM PROCEDURE TRAY) ×3 IMPLANT
PENCIL BUTTON HOLSTER BLD 10FT (ELECTRODE) ×3 IMPLANT
SLEEVE SCD COMPRESS KNEE MED (MISCELLANEOUS) ×2 IMPLANT
STAPLER VISISTAT 35W (STAPLE) IMPLANT
SUT MON AB 3-0 SH 27 (SUTURE)
SUT MON AB 3-0 SH27 (SUTURE) IMPLANT
SUT MON AB 5-0 PS2 18 (SUTURE) ×3 IMPLANT
SUT SILK 3 0 SH 30 (SUTURE) IMPLANT
SUT VIC AB 4-0 BRD 54 (SUTURE) IMPLANT
SUT VICRYL 3-0 CR8 SH (SUTURE) ×3 IMPLANT
SYR BULB 3OZ (MISCELLANEOUS) ×2 IMPLANT
SYR CONTROL 10ML LL (SYRINGE) ×3 IMPLANT
TOWEL OR 17X24 6PK STRL BLUE (TOWEL DISPOSABLE) ×6 IMPLANT
TOWEL OR NON WOVEN STRL DISP B (DISPOSABLE) ×3 IMPLANT
TUBE CONNECTING 20'X1/4 (TUBING) ×1
TUBE CONNECTING 20X1/4 (TUBING) ×1 IMPLANT
YANKAUER SUCT BULB TIP NO VENT (SUCTIONS) ×2 IMPLANT

## 2014-05-08 NOTE — Discharge Instructions (Signed)
[facility] °Office Phone Number [phone] ° °BREAST BIOPSY/ PARTIAL MASTECTOMY: POST OP INSTRUCTIONS ° °Always review your discharge instruction sheet given to you by the facility where your surgery was performed. ° °IF YOU HAVE DISABILITY OR FAMILY LEAVE FORMS, YOU MUST BRING THEM TO THE OFFICE FOR PROCESSING.  DO NOT GIVE THEM TO YOUR DOCTOR. ° °1. A prescription for pain medication may be given to you upon discharge.  Take your pain medication as prescribed, if needed.  If narcotic pain medicine is not needed, then you may take acetaminophen (Tylenol) or ibuprofen (Advil) as needed. °2. Take your usually prescribed medications unless otherwise directed °3. If you need a refill on your pain medication, please contact your pharmacy.  They will contact our office to request authorization.  Prescriptions will not be filled after 5pm or on week-ends. °4. You should eat very light the first 24 hours after surgery, such as soup, crackers, pudding, etc.  Resume your normal diet the day after surgery. °5. Most patients will experience some swelling and bruising in the breast.  Ice packs and a good support bra will help.  Swelling and bruising can take several days to resolve.  °6. It is common to experience some constipation if taking pain medication after surgery.  Increasing fluid intake and taking a stool softener will usually help or prevent this problem from occurring.  A mild laxative (Milk of Magnesia or Miralax) should be taken according to package directions if there are no bowel movements after 48 hours. °7. Unless discharge instructions indicate otherwise, you may remove your bandages 24-48 hours after surgery, and you may shower at that time.  You may have steri-strips (small skin tapes) in place directly over the incision.  These strips should be left on the skin for 7-10 days.  If your surgeon used skin glue on the incision, you may shower in 24 hours.  The glue will flake off over the  next 2-3 weeks.  Any sutures or staples will be removed at the office during your follow-up visit. °8. ACTIVITIES:  You may resume regular daily activities (gradually increasing) beginning the next day.  Wearing a good support bra or sports bra minimizes pain and swelling.  You may have sexual intercourse when it is comfortable. °a. You may drive when you no longer are taking prescription pain medication, you can comfortably wear a seatbelt, and you can safely maneuver your car and apply brakes. °b. RETURN TO WORK:  ______________________________________________________________________________________ °9. You should see your doctor in the office for a follow-up appointment approximately two weeks after your surgery.  Your doctor’s nurse will typically make your follow-up appointment when she calls you with your pathology report.  Expect your pathology report 2-3 business days after your surgery.  You may call to check if you do not hear from us after three days. °10. OTHER INSTRUCTIONS: _______________________________________________________________________________________________ _____________________________________________________________________________________________________________________________________ °_____________________________________________________________________________________________________________________________________ °_____________________________________________________________________________________________________________________________________ ° °WHEN TO CALL YOUR DOCTOR: °1. Fever over 101.0 °2. Nausea and/or vomiting. °3. Extreme swelling or bruising. °4. Continued bleeding from incision. °5. Increased pain, redness, or drainage from the incision. ° °The clinic staff is available to answer your questions during regular business hours.  Please don’t hesitate to call and ask to speak to one of the nurses for clinical concerns.  If you have a medical emergency, go to the nearest  emergency room or call 911.  A surgeon from [facility] is always on call at the hospital. ° °For further questions, please visit [email or web address]  ° ° °  Post Anesthesia Home Care Instructions ° °Activity: °Get plenty of rest for the remainder of the day. A responsible adult should stay with you for 24 hours following the procedure.  °For the next 24 hours, DO NOT: °-Drive a car °-Operate machinery °-Drink alcoholic beverages °-Take any medication unless instructed by your physician °-Make any legal decisions or sign important papers. ° °Meals: °Start with liquid foods such as gelatin or soup. Progress to regular foods as tolerated. Avoid greasy, spicy, heavy foods. If nausea and/or vomiting occur, drink only clear liquids until the nausea and/or vomiting subsides. Call your physician if vomiting continues. ° °Special Instructions/Symptoms: °Your throat may feel dry or sore from the anesthesia or the breathing tube placed in your throat during surgery. If this causes discomfort, gargle with warm salt water. The discomfort should disappear within 24 hours. ° °

## 2014-05-08 NOTE — Transfer of Care (Signed)
Immediate Anesthesia Transfer of Care Note  Patient: Debbie Joseph  Procedure(s) Performed: Procedure(s): BREAST LUMPECTOMY WITH NEEDLE LOCALIZATION (Right)  Patient Location: PACU  Anesthesia Type:General  Level of Consciousness: awake, alert  and oriented  Airway & Oxygen Therapy: Patient Spontanous Breathing and Patient connected to face mask oxygen  Post-op Assessment: Report given to PACU RN and Post -op Vital signs reviewed and stable  Post vital signs: Reviewed and stable  Complications: No apparent anesthesia complications

## 2014-05-08 NOTE — Anesthesia Postprocedure Evaluation (Signed)
Anesthesia Post Note  Patient: [name]  Procedure(s) Performed: Procedure(s) (LRB): BREAST LUMPECTOMY WITH NEEDLE LOCALIZATION (Right)  Anesthesia type: General  Patient location: PACU  Post pain: Pain level controlled  Post assessment: Post-op Vital signs reviewed  Last Vitals: BP 154/66 mmHg  Pulse 91  Temp(Src) 36.6 C (Oral)  Resp 16  Ht 5\' 5"  (1.651 m)  Wt 186 lb (84.369 kg)  BMI 30.95 kg/m2  SpO2 94%  Post vital signs: Reviewed  Level of consciousness: sedated  Complications: No apparent anesthesia complications

## 2014-05-08 NOTE — Interval H&P Note (Signed)
History and Physical Interval Note:  05/08/2014 12:50 PM  Debbie Joseph  has presented today for surgery, with the diagnosis of right breast DCIS  The various methods of treatment have been discussed with the patient and family. After consideration of risks, benefits and other options for treatment, the patient has consented to  Procedure(s): BREAST LUMPECTOMY WITH NEEDLE LOCALIZATION (Right) as a surgical intervention .  The patient's history has been reviewed, patient examined, no change in status, stable for surgery.  I have reviewed the patient's chart and labs.  Questions were answered to the patient's satisfaction.     Navdeep Halt T

## 2014-05-08 NOTE — Anesthesia Preprocedure Evaluation (Signed)
Anesthesia Evaluation  Patient identified by MRN, date of birth, ID band Patient awake    Reviewed: Allergy & Precautions, NPO status , Patient's Chart, lab work & pertinent test results  Airway Mallampati: I  TM Distance: >3 FB Neck ROM: Full    Dental  (+) Teeth Intact, Dental Advisory Given   Pulmonary  breath sounds clear to auscultation        Cardiovascular hypertension, Pt. on medications Rhythm:Regular Rate:Normal     Neuro/Psych    GI/Hepatic GERD-  ,  Endo/Other    Renal/GU      Musculoskeletal   Abdominal   Peds  Hematology   Anesthesia Other Findings   Reproductive/Obstetrics                             Anesthesia Physical Anesthesia Plan  ASA: II  Anesthesia Plan: General   Post-op Pain Management:    Induction: Intravenous  Airway Management Planned: LMA  Additional Equipment:   Intra-op Plan:   Post-operative Plan: Extubation in OR  Informed Consent: I have reviewed the patients History and Physical, chart, labs and discussed the procedure including the risks, benefits and alternatives for the proposed anesthesia with the patient or authorized representative who has indicated his/her understanding and acceptance.   Dental advisory given  Plan Discussed with: CRNA, Anesthesiologist and Surgeon  Anesthesia Plan Comments:         Anesthesia Quick Evaluation

## 2014-05-08 NOTE — Op Note (Signed)
Preoperative Diagnosis: right breast DCIS  Postoprative Diagnosis: right breast DCIS  Procedure: Procedure(s): BREAST LUMPECTOMY WITH NEEDLE LOCALIZATION   Surgeon: Excell Seltzer T   Assistants: None  Anesthesia:  General LMA anesthesia  Indications: Patient is a 73 year old female with a recent diagnosis of ductal carcinoma in situ in the lateral right breast with calcifications and MRI indicating 4.5-5 cm area. After discussion regarding treatment options and risks detailed extensively elsewhere we have elected to proceed with right breast lumpectomy.    Procedure Detail: Preoperatively the patient underwent  Accurate needle localization bracketing anterior and posterior to the area of calcifications. She was brought to the operating room, placed in the supine position on the operating table and laryngeal mask general anesthesia induced. She was carefully positioned with her right arm extended and the entire right chest and breast and upper arm were widely sterilely prepped and draped. She received preoperative IV antibiotics. Patient timeout was performed and correct procedure verified. I made a curvilinear incision in the lateral right breast between the 2 wires and dissection was carried down through the subcutaneous tissue. As the breast capsule was approached the dissection was extended anteriorly and posteriorly and both wires were brought into the incision. Staying anterior to the anterior wire and posterior to the posterior wire and extending the dissection about 2-3 cm superior and inferior a generous specimen of breast tissue was excised with cautery coming around the medial aspect of the specimen medial to the tips of the wires. The specimen was oriented with ink. Specimen mammography showed the intact localization wires and the calcifications well positioned within the specimen although a little closer to the medial edge than others. I did re-excise the medial margin and  additional 1/2 cm and this was sent as a permanent section oriented. The soft tissue was infiltrated with Marcaine and complete hemostasis obtained with cautery. Clips were placed to mark the lumpectomy cavity. The breast and subcutaneous tissue was closed with interrupted 3-0 Vicryl and the skin with subcuticular 4-0 Monocryl and Dermabond. Sponge needle and instrument counts were correct.    Findings: As above  Estimated Blood Loss:  Minimal         Drains: none  Blood Given: none          Specimens: #1 right breast lumpectomy     #2 additional medial margin, oriented        Complications:  * No complications entered in OR log *         Disposition: PACU - hemodynamically stable.         Condition: stable

## 2014-05-08 NOTE — Interval H&P Note (Signed)
History and Physical Interval Note:  05/08/2014 12:50 PM  Debbie Joseph  has presented today for surgery, with the diagnosis of right breast DCIS  The various methods of treatment have been discussed with the patient and family. After consideration of risks, benefits and other options for treatment, the patient has consented to  Procedure(s): BREAST LUMPECTOMY WITH NEEDLE LOCALIZATION (Right) as a surgical intervention .  The patient's history has been reviewed, patient examined, no change in status, stable for surgery.  I have reviewed the patient's chart and labs.  Questions were answered to the patient's satisfaction.     Treven Holtman T

## 2014-05-08 NOTE — H&P (Signed)
Debbie Joseph 05/05/2014 2:51 PM Location: [facility] Patient #: [id number] DOB: 27-Aug-1940 Married / Language: English / Race: White Female  History of Present Illness [name] T. Tocarra Gassen MD; 05/05/2014 3:09 PM) Patient words: Evaluate Right Breast cancer.  The patient is a 74 year old female who presents with breast cancer. She is here due to a new diagnosis of DCIS of the right breast. She has a history of DCIS of the left breast status post lumpectomy by Dr. Annamaria Boots in 2002 followed by radiation and hormonal therapy for at least 5 years. She had no problems or recurrence with this treatment. Recent screening mammogram revealed a new area of calcifications in the lateral aspect of the right breast. Diagnostic mammogram was recommended and magnification views revealed fine pleomorphic calcifications spanning at least 5 cm within the posterior lower outer right breast. No associated mass. Stereotactic large core needle biopsy was recommended and was performed on January 11 which revealed ductal carcinoma in situ, intermediate to high grade, estrogen and progesterone receptor positive. The patient subsequently has had bilateral breast MRI which has revealed a 4.5 cm area of abnormal enhancement in the right breast corresponding to the known area of calcifications but no other abnormalities in either breast. I have personally reviewed all of her imaging. She denies any breast symptoms, specifically mass or nipple discharge or skin changes or pain.   Other Problems Ivor Costa, [place]; 05/05/2014 2:53 PM) Breast Cancer High blood pressure Migraine Headache Umbilical Hernia Repair  Past Surgical History Ivor Costa, [place]; 05/05/2014 2:53 PM) Appendectomy Breast Biopsy Bilateral. Breast Mass; Local Excision Left. Cataract Surgery Bilateral. Hysterectomy (not due to cancer) - Partial Oral Surgery Ventral / Umbilical Hernia Surgery Right.  Diagnostic Studies History  Ivor Costa, [place]; 05/05/2014 2:53 PM) Colonoscopy 5-10 years ago Mammogram within last year Pap Smear >5 years ago  Allergies Ivor [place], [place]; 05/05/2014 2:53 PM) Evista *ENDOCRINE AND METABOLIC AGENTS - MISC.* Joint Pain  Medication History Ivor [place], [place]; 05/05/2014 2:55 PM) Losartan Potassium-HCTZ (100-12.5MG  Tablet, Oral) Active. PriLOSEC (20MG  Capsule DR, Oral) Active. Red Yeast Rice Extract (500MG /0.5GM Powder, Oral) Active.  Social History Alyse Low [place], [place]; 05/05/2014 2:53 PM) Caffeine use Coffee, Tea. No alcohol use No drug use Tobacco use Never smoker.  Family History Ivor Costa, [place]; 05/05/2014 2:53 PM) Cancer Sister. Diabetes Mellitus Brother, Sister. Hypertension Brother, Mother. Respiratory Condition Father.  Pregnancy / Birth History Ivor Costa, [place]; 05/05/2014 2:53 PM) Age at menarche 44 years. Age of menopause <46 Gravida 0 Para 0  Review of Systems Alyse Low [place]; 05/05/2014 2:53 PM) General Present- Night Sweats and Weight Gain. Not Present- Appetite Loss, Chills, Fatigue, Fever and Weight Loss. HEENT Present- Ringing in the Ears. Not Present- Earache, Hearing Loss, Hoarseness, Nose Bleed, Oral Ulcers, Seasonal Allergies, Sinus Pain, Sore Throat, Visual Disturbances, Wears glasses/contact lenses and Yellow Eyes. Breast Not Present- Breast Mass, Breast Pain, Nipple Discharge and Skin Changes. Cardiovascular Not Present- Chest Pain, Difficulty Breathing Lying Down, Leg Cramps, Palpitations, Rapid Heart Rate, Shortness of Breath and Swelling of Extremities. Gastrointestinal Present- Indigestion. Not Present- Abdominal Pain, Bloating, Bloody Stool, Change in Bowel Habits, Chronic diarrhea, Constipation, Difficulty Swallowing, Excessive gas, Gets full quickly at meals, Hemorrhoids, Nausea, Rectal Pain and Vomiting. Female Genitourinary Not Present- Frequency, Nocturia, Painful Urination, Pelvic Pain and Urgency. Musculoskeletal  Present- Joint Pain. Not Present- Back Pain, Joint Stiffness, Muscle Pain, Muscle Weakness and Swelling of Extremities. Neurological Not Present- Decreased Memory, Fainting, Headaches, Numbness, Seizures, Tingling, Tremor, Trouble walking and  Weakness. Psychiatric Not Present- Anxiety, Bipolar, Change in Sleep Pattern, Depression, Fearful and Frequent crying. Endocrine Not Present- Cold Intolerance, Excessive Hunger, Hair Changes, Heat Intolerance, Hot flashes and New Diabetes. Hematology Not Present- Easy Bruising, Excessive bleeding, Gland problems, HIV and Persistent Infections.   Vitals Ivor Costa MA; 05/05/2014 2:53 PM) 05/05/2014 2:52 PM Weight: 190 lb Height: 65in Body Surface Area: 1.99 m Body Mass Index: 31.62 kg/m Pulse: 80 (Regular)  Resp.: 18 (Unlabored)  BP: 142/88 (Sitting, Left Arm, Standard)    Physical Exam [name] T. Nautia Lem MD; 05/05/2014 3:10 PM) The physical exam findings are as follows: Note:General: Alert, well-developed and well nourished Caucasian female, in no distress Skin: Warm and dry without rash or infection. HEENT: No palpable masses or thyromegaly. Sclera nonicteric. Pupils equal round and reactive. Oropharynx clear. Lymph nodes: No cervical, supraclavicular, or inguinal nodes palpable. Breasts: Healed lumpectomy site upper left breast. Some shrinkage generally left breast. No palpable masses in either breast particular attention to the lateral right breast. No palpable axillary adenopathy. No skin changes or nipple discharge or inversion. Lungs: Breath sounds clear and equal. No wheezing or increased work of breathing. Cardiovascular: Regular rate and rhythm without murmer. No JVD or edema. Peripheral pulses intact. No carotid bruits. Abdomen: Nondistended. Soft and nontender. No masses palpable. No organomegaly. Extremities: No edema or joint swelling or deformity. Neurologic: Alert and fully oriented. Gait normal. No focal  weakness. Psychiatric: Normal mood and affect. Thought content appropriate with normal judgement and insight    Assessment & Plan [name] T. Rafiel Mecca MD; 05/05/2014 3:20 PM) DCIS (DUCTAL CARCINOMA IN SITU), RIGHT (233.0  D05.11) Impression: New diagnosis of intermediate to high-grade ductal carcinoma in situ of the right breast, ER/PR positive, estimated size approximately 5 cm based on calcifications. Previous history of DCIS of the left breast. She obviously is well aware of the treatment. We discussed options of lumpectomy versus mastectomy and I believe she would be an adequate candidate for breast conservation. We discussed the possibility of positive margins and further surgery needed. Discussed risks of bleeding and infection and anesthetic complications. Alternatives of total mastectomy with or without reconstruction were discussed. She prefers breast conservation and will plan to proceed with needle localized left breast lumpectomy under general anesthesia as an outpatient. Current Plans  Schedule for Surgery Needle localized left breast lumpectomy under general anesthesia as an outpatient   Signed by Edward Jolly, MD (05/05/2014 3:22 PM)

## 2014-05-08 NOTE — Anesthesia Procedure Notes (Signed)
Procedure Name: LMA Insertion Date/Time: 05/08/2014 2:27 PM Performed by: Lieutenant Diego Pre-anesthesia Checklist: Patient identified, Emergency Drugs available, Suction available and Patient being monitored Patient Re-evaluated:Patient Re-evaluated prior to inductionOxygen Delivery Method: Circle System Utilized Preoxygenation: Pre-oxygenation with 100% oxygen Intubation Type: IV induction Ventilation: Mask ventilation without difficulty LMA: LMA inserted LMA Size: 4.0 Number of attempts: 1 Airway Equipment and Method: Bite block Placement Confirmation: positive ETCO2 and breath sounds checked- equal and bilateral Tube secured with: Tape Dental Injury: Teeth and Oropharynx as per pre-operative assessment

## 2014-05-09 ENCOUNTER — Encounter (HOSPITAL_BASED_OUTPATIENT_CLINIC_OR_DEPARTMENT_OTHER): Payer: Self-pay | Admitting: General Surgery

## 2014-05-11 ENCOUNTER — Ambulatory Visit: Payer: Medicare Other | Admitting: Cardiology

## 2014-05-11 ENCOUNTER — Other Ambulatory Visit (INDEPENDENT_AMBULATORY_CARE_PROVIDER_SITE_OTHER): Payer: Self-pay | Admitting: General Surgery

## 2014-05-12 ENCOUNTER — Encounter (HOSPITAL_COMMUNITY): Payer: Self-pay | Admitting: *Deleted

## 2014-05-15 ENCOUNTER — Ambulatory Visit (HOSPITAL_COMMUNITY): Payer: Medicare Other

## 2014-05-15 ENCOUNTER — Encounter (HOSPITAL_COMMUNITY)
Admission: RE | Disposition: A | Discharge status: Home / Self Care | Payer: Self-pay | Source: Ambulatory Visit | Attending: General Surgery

## 2014-05-15 ENCOUNTER — Ambulatory Visit (HOSPITAL_COMMUNITY): Payer: Medicare Other | Admitting: Registered Nurse

## 2014-05-15 ENCOUNTER — Encounter (HOSPITAL_COMMUNITY): Payer: Self-pay | Admitting: *Deleted

## 2014-05-15 ENCOUNTER — Ambulatory Visit (HOSPITAL_COMMUNITY)
Admission: RE | Admit: 2014-05-15 | Discharge: 2014-05-15 | Disposition: A | Discharge status: Home / Self Care | Payer: Medicare Other | Source: Ambulatory Visit | Attending: General Surgery | Admitting: General Surgery

## 2014-05-15 DIAGNOSIS — Z01818 Encounter for other preprocedural examination: Secondary | ICD-10-CM

## 2014-05-15 DIAGNOSIS — G43909 Migraine, unspecified, not intractable, without status migrainosus: Secondary | ICD-10-CM | POA: Insufficient documentation

## 2014-05-15 DIAGNOSIS — D0511 Intraductal carcinoma in situ of right breast: Secondary | ICD-10-CM | POA: Diagnosis not present

## 2014-05-15 DIAGNOSIS — N6091 Unspecified benign mammary dysplasia of right breast: Secondary | ICD-10-CM | POA: Diagnosis not present

## 2014-05-15 DIAGNOSIS — I10 Essential (primary) hypertension: Secondary | ICD-10-CM | POA: Diagnosis not present

## 2014-05-15 DIAGNOSIS — Z888 Allergy status to other drugs, medicaments and biological substances status: Secondary | ICD-10-CM | POA: Insufficient documentation

## 2014-05-15 DIAGNOSIS — D0590 Unspecified type of carcinoma in situ of unspecified breast: Secondary | ICD-10-CM | POA: Diagnosis not present

## 2014-05-15 DIAGNOSIS — K219 Gastro-esophageal reflux disease without esophagitis: Secondary | ICD-10-CM | POA: Diagnosis not present

## 2014-05-15 HISTORY — DX: Family history of other specified conditions: Z84.89

## 2014-05-15 HISTORY — DX: Headache, unspecified: R51.9

## 2014-05-15 HISTORY — PX: RE-EXCISION OF BREAST LUMPECTOMY: SHX6048

## 2014-05-15 HISTORY — DX: Headache: R51

## 2014-05-15 LAB — BASIC METABOLIC PANEL
Anion gap: 6 (ref 5–15)
BUN: 22 mg/dL (ref 6–23)
CHLORIDE: 106 mmol/L (ref 96–112)
CO2: 29 mmol/L (ref 19–32)
Calcium: 9.1 mg/dL (ref 8.4–10.5)
Creatinine, Ser: 0.74 mg/dL (ref 0.50–1.10)
GFR calc Af Amer: >90 mL/min (ref >90)
GFR calc non Af Amer: 83 mL/min — ABNORMAL LOW (ref >90)
Glucose, Bld: 127 mg/dL — ABNORMAL HIGH (ref 70–99)
POTASSIUM: 4.3 mmol/L (ref 3.5–5.1)
Sodium: 141 mmol/L (ref 135–145)

## 2014-05-15 LAB — CBC
HEMATOCRIT: 42.4 % (ref 36.0–46.0)
Hemoglobin: 13.7 g/dL (ref 12.0–15.0)
MCH: 29 pg (ref 26.0–34.0)
MCHC: 32.3 g/dL (ref 30.0–36.0)
MCV: 89.6 fL (ref 78.0–100.0)
PLATELETS: 190 10*3/uL (ref 150–400)
RBC: 4.73 MIL/uL (ref 3.87–5.11)
RDW: 13 % (ref 11.5–15.5)
WBC: 7.7 10*3/uL (ref 4.0–10.5)

## 2014-05-15 SURGERY — EXCISION, LESION, BREAST
Anesthesia: General | Site: Breast | Laterality: Right

## 2014-05-15 MED ORDER — BUPIVACAINE-EPINEPHRINE (PF) 0.25% -1:200000 IJ SOLN
INTRAMUSCULAR | Status: AC
  Filled 2014-05-15: qty 30

## 2014-05-15 MED ORDER — ONDANSETRON HCL 4 MG/2ML IJ SOLN
INTRAMUSCULAR | Status: DC | PRN
  Administered 2014-05-15: 4 mg via INTRAVENOUS

## 2014-05-15 MED ORDER — ONDANSETRON HCL 4 MG/2ML IJ SOLN
INTRAMUSCULAR | Status: AC
  Filled 2014-05-15: qty 2

## 2014-05-15 MED ORDER — MIDAZOLAM HCL 5 MG/5ML IJ SOLN
INTRAMUSCULAR | Status: DC | PRN
  Administered 2014-05-15: 2 mg via INTRAVENOUS

## 2014-05-15 MED ORDER — PROMETHAZINE HCL 25 MG/ML IJ SOLN: 6.2500 mg | INTRAMUSCULAR | Status: DC | PRN

## 2014-05-15 MED ORDER — LIDOCAINE HCL (CARDIAC) 10 MG/ML IV SOLN
INTRAVENOUS | Status: DC | PRN
  Administered 2014-05-15: 100 mg via INTRAVENOUS

## 2014-05-15 MED ORDER — 0.9 % SODIUM CHLORIDE (POUR BTL) OPTIME
TOPICAL | Status: DC | PRN
  Administered 2014-05-15: 1000 mL

## 2014-05-15 MED ORDER — LACTATED RINGERS IV SOLN
INTRAVENOUS | Status: DC
  Administered 2014-05-15: 1000 mL via INTRAVENOUS
  Administered 2014-05-15: 12:00:00 via INTRAVENOUS

## 2014-05-15 MED ORDER — FENTANYL CITRATE 0.05 MG/ML IJ SOLN
INTRAMUSCULAR | Status: DC | PRN
  Administered 2014-05-15: 25 ug via INTRAVENOUS
  Administered 2014-05-15: 50 ug via INTRAVENOUS
  Administered 2014-05-15: 25 ug via INTRAVENOUS

## 2014-05-15 MED ORDER — MIDAZOLAM HCL 2 MG/2ML IJ SOLN
INTRAMUSCULAR | Status: AC
  Filled 2014-05-15: qty 2

## 2014-05-15 MED ORDER — PROPOFOL 10 MG/ML IV BOLUS
INTRAVENOUS | Status: AC
  Filled 2014-05-15: qty 20

## 2014-05-15 MED ORDER — CHLORHEXIDINE GLUCONATE 4 % EX LIQD: 1.0000 "application " | Freq: Once | CUTANEOUS | Status: DC

## 2014-05-15 MED ORDER — DEXAMETHASONE SODIUM PHOSPHATE 10 MG/ML IJ SOLN
INTRAMUSCULAR | Status: AC
  Filled 2014-05-15: qty 1

## 2014-05-15 MED ORDER — LIDOCAINE HCL (CARDIAC) 20 MG/ML IV SOLN
INTRAVENOUS | Status: AC
  Filled 2014-05-15: qty 5

## 2014-05-15 MED ORDER — MEPERIDINE HCL 50 MG/ML IJ SOLN: 6.2500 mg | INTRAMUSCULAR | Status: DC | PRN

## 2014-05-15 MED ORDER — ACETAMINOPHEN 10 MG/ML IV SOLN
1000.0000 mg | Freq: Once | INTRAVENOUS | Status: AC
  Administered 2014-05-15: 1000 mg via INTRAVENOUS
  Filled 2014-05-15: qty 100

## 2014-05-15 MED ORDER — DEXAMETHASONE SODIUM PHOSPHATE 10 MG/ML IJ SOLN
INTRAMUSCULAR | Status: DC | PRN
  Administered 2014-05-15: 10 mg via INTRAVENOUS

## 2014-05-15 MED ORDER — BUPIVACAINE-EPINEPHRINE (PF) 0.25% -1:200000 IJ SOLN
INTRAMUSCULAR | Status: DC | PRN
  Administered 2014-05-15: 30 mL

## 2014-05-15 MED ORDER — CEFAZOLIN SODIUM-DEXTROSE 2-3 GM-% IV SOLR
INTRAVENOUS | Status: AC
  Filled 2014-05-15: qty 50

## 2014-05-15 MED ORDER — CEFAZOLIN SODIUM-DEXTROSE 2-3 GM-% IV SOLR
2.0000 g | INTRAVENOUS | Status: AC
  Administered 2014-05-15: 2 g via INTRAVENOUS

## 2014-05-15 MED ORDER — FENTANYL CITRATE 0.05 MG/ML IJ SOLN: 25.0000 ug | INTRAMUSCULAR | Status: DC | PRN

## 2014-05-15 MED ORDER — FENTANYL CITRATE 0.05 MG/ML IJ SOLN
INTRAMUSCULAR | Status: AC
  Filled 2014-05-15: qty 5

## 2014-05-15 MED ORDER — PROPOFOL 10 MG/ML IV BOLUS
INTRAVENOUS | Status: DC | PRN
  Administered 2014-05-15: 180 mg via INTRAVENOUS

## 2014-05-15 SURGICAL SUPPLY — 30 items
BINDER BREAST LRG (GAUZE/BANDAGES/DRESSINGS) ×2 IMPLANT
BLADE SURG 15 STRL LF DISP TIS (BLADE) ×2 IMPLANT
BLADE SURG 15 STRL SS (BLADE) ×6
BLADE SURG SZ10 CARB STEEL (BLADE) ×3 IMPLANT
CHLORAPREP W/TINT 10.5 ML (MISCELLANEOUS) ×3 IMPLANT
CLIP TI WIDE RED SMALL 6 (CLIP) ×3 IMPLANT
DECANTER SPIKE VIAL GLASS SM (MISCELLANEOUS) ×1 IMPLANT
DRAPE LAPAROTOMY TRNSV 102X78 (DRAPE) ×3 IMPLANT
ELECT COATED BLADE 2.86 ST (ELECTRODE) ×3 IMPLANT
ELECT REM PT RETURN 9FT ADLT (ELECTROSURGICAL) ×3
ELECTRODE REM PT RTRN 9FT ADLT (ELECTROSURGICAL) ×1 IMPLANT
GAUZE SPONGE 4X4 12PLY STRL (GAUZE/BANDAGES/DRESSINGS) ×3 IMPLANT
GAUZE SPONGE 4X4 16PLY XRAY LF (GAUZE/BANDAGES/DRESSINGS) ×3 IMPLANT
GLOVE BIOGEL PI IND STRL 7.5 (GLOVE) ×1 IMPLANT
GLOVE BIOGEL PI INDICATOR 7.5 (GLOVE) ×2
GLOVE ECLIPSE 7.5 STRL STRAW (GLOVE) ×3 IMPLANT
GOWN STRL REUS W/TWL LRG LVL3 (GOWN DISPOSABLE) ×3 IMPLANT
GOWN STRL REUS W/TWL XL LVL3 (GOWN DISPOSABLE) ×6 IMPLANT
KIT BASIN OR (CUSTOM PROCEDURE TRAY) ×3 IMPLANT
LIQUID BAND (GAUZE/BANDAGES/DRESSINGS) ×2 IMPLANT
NDL HYPO 25X1 1.5 SAFETY (NEEDLE) ×1 IMPLANT
NEEDLE HYPO 22GX1.5 SAFETY (NEEDLE) ×3 IMPLANT
NEEDLE HYPO 25X1 1.5 SAFETY (NEEDLE) ×3 IMPLANT
NS IRRIG 1000ML POUR BTL (IV SOLUTION) ×3 IMPLANT
PACK BASIC VI WITH GOWN DISP (CUSTOM PROCEDURE TRAY) ×3 IMPLANT
PENCIL BUTTON HOLSTER BLD 10FT (ELECTRODE) ×3 IMPLANT
SPONGE LAP 4X18 X RAY DECT (DISPOSABLE) ×3 IMPLANT
SYR CONTROL 10ML LL (SYRINGE) ×3 IMPLANT
TOWEL OR 17X26 10 PK STRL BLUE (TOWEL DISPOSABLE) ×3 IMPLANT
YANKAUER SUCT BULB TIP 10FT TU (MISCELLANEOUS) ×3 IMPLANT

## 2014-05-15 NOTE — H&P (Signed)
Author: Excell Seltzer, MD Service: Surgery Author Type: Physician    Filed: 05/08/2014 12:49 PM Note Time: 05/08/2014 12:49 PM Status: Signed   Editor: Excell Seltzer, MD (Physician)     Expand All Collapse All   Debbie Joseph 05/05/2014 2:51 PM Location: [facility] Patient #: [id number] DOB: December 30, 1940 Married / Language: English / Race: White Female  History of Present Illness [name] T. Dorinne Graeff MD; 05/05/2014 3:09 PM) Patient words: Evaluate Right Breast cancer.  The patient is a 74 year old female who presents with breast cancer. She she has a recent diagnosis of DCIS of the right breast. She has a history of DCIS of the left breast status post lumpectomy by Dr. Annamaria Boots in 2002 followed by radiation and hormonal therapy for at least 5 years. She had no problems or recurrence with this treatment. Recent screening mammogram revealed a new area of calcifications in the lateral aspect of the right breast. Diagnostic mammogram was recommended and magnification views revealed fine pleomorphic calcifications spanning at least 5 cm within the posterior lower outer right breast. No associated mass. Stereotactic large core needle biopsy was recommended and was performed on January 11 which revealed ductal carcinoma in situ, intermediate to high grade, estrogen and progesterone receptor positive. The patient subsequently has had bilateral breast MRI which has revealed a 4.5 cm area of abnormal enhancement in the right breast corresponding to the known area of calcifications but no other abnormalities in either breast. I have personally reviewed all of her imaging. She denies any breast symptoms, specifically mass or nipple discharge or skin changes or pain.  She underwent lumpectomy with bracketing of the area on 05/08/2014.  Path showed DCIS with a less than .1 cm medial margin.  I reccommended re excision of this margin and she presents for this procedure   Other Problems Ivor Costa, [place]; 05/05/2014 2:53 PM) Breast Cancer High blood pressure Migraine Headache Umbilical Hernia Repair  Past Surgical History Ivor [place], [place]; 05/05/2014 2:53 PM) Appendectomy Breast Biopsy Bilateral. Breast Mass; Local Excision Left. Cataract Surgery Bilateral. Hysterectomy (not due to cancer) - Partial Oral Surgery Ventral / Umbilical Hernia Surgery Right.  Diagnostic Studies History Ivor [place], [place]; 05/05/2014 2:53 PM) Colonoscopy 5-10 years ago Mammogram within last year Pap Smear >5 years ago  Allergies Ivor [place], [place]; 05/05/2014 2:53 PM) Evista *ENDOCRINE AND METABOLIC AGENTS - MISC.* Joint Pain  Medication History Ivor Costa, [place]; 05/05/2014 2:55 PM) Losartan Potassium-HCTZ (100-12.5MG  Tablet, Oral) Active. PriLOSEC (20MG  Capsule DR, Oral) Active. Red Yeast Rice Extract (500MG /0.5GM Powder, Oral) Active.  Social History Alyse Low [place], [place]; 05/05/2014 2:53 PM) Caffeine use Coffee, Tea. No alcohol use No drug use Tobacco use Never smoker.  Family History Ivor Costa, [place]; 05/05/2014 2:53 PM) Cancer Sister. Diabetes Mellitus Brother, Sister. Hypertension Brother, Mother. Respiratory Condition Father.  Pregnancy / Birth History Ivor Costa, [place]; 05/05/2014 2:53 PM) Age at menarche 47 years. Age of menopause <46 Gravida 0 Para 0  Review of Systems Alyse Low [place]; 05/05/2014 2:53 PM) General Present- Night Sweats and Weight Gain. Not Present- Appetite Loss, Chills, Fatigue, Fever and Weight Loss. HEENT Present- Ringing in the Ears. Not Present- Earache, Hearing Loss, Hoarseness, Nose Bleed, Oral Ulcers, Seasonal Allergies, Sinus Pain, Sore Throat, Visual Disturbances, Wears glasses/contact lenses and Yellow Eyes. Breast Not Present- Breast Mass, Breast Pain, Nipple Discharge and Skin Changes. Cardiovascular Not Present- Chest Pain, Difficulty Breathing Lying Down, Leg Cramps, Palpitations, Rapid Heart Rate, Shortness of  Breath and Swelling of Extremities. Gastrointestinal  Present- Indigestion. Not Present- Abdominal Pain, Bloating, Bloody Stool, Change in Bowel Habits, Chronic diarrhea, Constipation, Difficulty Swallowing, Excessive gas, Gets full quickly at meals, Hemorrhoids, Nausea, Rectal Pain and Vomiting. Female Genitourinary Not Present- Frequency, Nocturia, Painful Urination, Pelvic Pain and Urgency. Musculoskeletal Present- Joint Pain. Not Present- Back Pain, Joint Stiffness, Muscle Pain, Muscle Weakness and Swelling of Extremities. Neurological Not Present- Decreased Memory, Fainting, Headaches, Numbness, Seizures, Tingling, Tremor, Trouble walking and Weakness. Psychiatric Not Present- Anxiety, Bipolar, Change in Sleep Pattern, Depression, Fearful and Frequent crying. Endocrine Not Present- Cold Intolerance, Excessive Hunger, Hair Changes, Heat Intolerance, Hot flashes and New Diabetes. Hematology Not Present- Easy Bruising, Excessive bleeding, Gland problems, HIV and Persistent Infections.   Vitals Ivor Costa MA; 05/05/2014 2:53 PM) 05/05/2014 2:52 PM Weight: 190 lb Height: 65in Body Surface Area: 1.99 m Body Mass Index: 31.62 kg/m Pulse: 80 (Regular)  Resp.: 18 (Unlabored)  BP: 142/88 (Sitting, Left Arm, Standard)    Physical Exam [name] T. Bradshaw Minihan MD; 05/05/2014 3:10 PM) The physical exam findings are as follows: Note:General: Alert, well-developed and well nourished Caucasian female, in no distress Skin: Warm and dry without rash or infection. HEENT: No palpable masses or thyromegaly. Sclera nonicteric. Pupils equal round and reactive. Oropharynx clear. Lymph nodes: No cervical, supraclavicular, or inguinal nodes palpable. Breasts: Healed lumpectomy site upper left breast. Some shrinkage generally left breast. Clean healing lumpectomy incision in the lateral right breast. No palpable axillary adenopathy. No skin changes or nipple discharge or inversion. Lungs: Breath  sounds clear and equal. No wheezing or increased work of breathing. Cardiovascular: Regular rate and rhythm without murmer. No JVD or edema. Peripheral pulses intact. No carotid bruits. Abdomen: Nondistended. Soft and nontender. No masses palpable. No organomegaly. Extremities: No edema or joint swelling or deformity. Neurologic: Alert and fully oriented. Gait normal. No focal weakness. Psychiatric: Normal mood and affect. Thought content appropriate with normal judgement and insight    Assessment & Plan [name] T. Ausencio Vaden MD; 05/05/2014 3:20 PM) DCIS (DUCTAL CARCINOMA IN SITU), RIGHT (233.0  D05.11) Impression: New diagnosis of intermediate to high-grade ductal carcinoma in situ of the right breast. ER/PR positive. Close medial margin post lumpectomy.  Presents for re excision.. Discussed risks of bleeding and infection and anesthetic complications. Alternatives of total mastectomy with or without reconstruction were discussed.   Schedule for Surgery Re excision right breast lumpectomy   Signed by Edward Jolly, MD (05/05/2014 3:22 PM)

## 2014-05-15 NOTE — Op Note (Signed)
Preoperative Diagnosis: DCIS right breast  Postoprative Diagnosis: DCIS right breast  Procedure: Procedure(s): RE-EXCISION OF BREAST LUMPECTOMY SUPERIOR AND MEDIAL MARGINS   Surgeon: Excell Seltzer T   Assistants: None  Anesthesia:  General LMA anesthesia  Indications: Patient is recently status post right breast lumpectomy for an approximately 4.5 cm area of intermediate grade ductal carcinoma in situ. Final pathology showed a close medial margin less than 1 mm and an adequate but somewhat close superior margin of less than 2 mm. Because of the medial margin I have recommended reexcision of the lumpectomy site and she is in agreement. Indications and risks have been discussed in detailed elsewhere.    Procedure Detail:  Patient was brought to the operating room, placed in the supine position on the operating table, and laryngeal mask general anesthesia induced. The right breast was widely sterilely prepped and draped after removing the Dermabond from the incision. She received preoperative IV antibiotics. Patient timeout was performed and correct procedure verified. The previous incision was sharply opened and suture material removed. Deeper suture tear was removed and the lumpectomy cavity widely and completely opened and exposed. The medial margin which had been marked with a clip was identified and widely exposed. I then completely excised the entire medial margin back for about 1 cm with cautery. The specimen was oriented with suture and sent for permanent pathology. I also reexcised the superior margin back about 0.5 cm which was also oriented and sent for permanent pathology. Hemostasis was obtained with cautery. The soft tissue was infiltrated with Marcaine. Breast and subcutaneous tissue was reapproximated with interrupted 3-0 Vicryl and skin closed with subcuticular 4-0 Monocryl and Dermabond. Sponge needle and instrument counts were correct.   Estimated Blood Loss:  Minimal   Drains: none          Specimens: #1 additional medial margin right breast suture on the new margin surface      #2 additional superior margin right breast suture on the new margin surface        Complications:  * No complications entered in OR log *         Disposition: PACU - hemodynamically stable.         Condition: stable

## 2014-05-15 NOTE — Discharge Instructions (Signed)
Breast Biopsy  Care After Refer to this sheet in the next few weeks. These instructions provide you with information on caring for yourself after your procedure. Your caregiver may also give you more specific instructions. Your treatment has been planned according to current medical practices, but problems sometimes occur. Call your caregiver if you have any problems or questions after your procedure. HOME CARE INSTRUCTIONS   Only take over-the-counter or prescription medicines for pain, discomfort, or fever as directed by your caregiver.  Do not take aspirin. It can cause bleeding.  Keep stitches dry when bathing.  Protect the biopsy area. Do not let the area get bumped.  Avoid activities that may pull the incision site open until approved by your caregiver. This can include stretching, reaching, exercise, sports, or lifting over 3 pounds.  Resume your usual diet.  Wear a good support bra for as long as directed by your caregiver.  Change any bandages (dressings) as directed by your caregiver.  Do not drink alcohol while taking pain medicine.  Keep all your follow-up appointments with your caregiver. Ask when your test results will be ready. Make sure you get your test results. SEEK MEDICAL CARE IF:   You have redness, swelling, or increasing pain in the biopsy site.  You have a bad smell coming from the biopsy site or dressing.  Your biopsy site breaks open after the stitches (sutures), staples, or skin adhesive strips have been removed.  You have a rash.  You need stronger medicine. SEEK IMMEDIATE MEDICAL CARE IF:   You have a fever.  You have increased bleeding (more than a small spot) from the biopsy site.  You have difficulty breathing.  You have pus coming from the biopsy site. MAKE SURE YOU:  Understand these instructions.  Will watch your condition.  Will get help right away if you are not doing well or get worse. Document Released: 10/25/2004 Document  Revised: 06/30/2011 Document Reviewed: 05/08/2011 [facility] Patient Information 2015 [place], [place]. This information is not intended to replace advice given to you by your health care provider. Make sure you discuss any questions you have with your health care provider.   General Anesthesia, Care After Refer to this sheet in the next few weeks. These instructions provide you with information on caring for yourself after your procedure. Your health care provider may also give you more specific instructions. Your treatment has been planned according to current medical practices, but problems sometimes occur. Call your health care provider if you have any problems or questions after your procedure. WHAT TO EXPECT AFTER THE PROCEDURE After the procedure, it is typical to experience:  Sleepiness.  Nausea and vomiting. HOME CARE INSTRUCTIONS  For the first 24 hours after general anesthesia:  Have a responsible person with you.  Do not drive a car. If you are alone, do not take public transportation.  Do not drink alcohol.  Do not take medicine that has not been prescribed by your health care provider.  Do not sign important papers or make important decisions.  You may resume a normal diet and activities as directed by your health care provider.  Change bandages (dressings) as directed.  If you have questions or problems that seem related to general anesthesia, call the hospital and ask for the anesthetist or anesthesiologist on call. SEEK MEDICAL CARE IF:  You have nausea and vomiting that continue the day after anesthesia.  You develop a rash. SEEK IMMEDIATE MEDICAL CARE IF:   You have difficulty breathing.  You have chest pain.  You have any allergic problems. Document Released: 07/14/2000 Document Revised: 04/12/2013 Document Reviewed: 10/21/2012 [facility] Patient Information 2015 [place], [place]. This information is not intended to replace advice given to you by your health  care provider. Make sure you discuss any questions you have with your health care provider.

## 2014-05-15 NOTE — Anesthesia Postprocedure Evaluation (Signed)
  Anesthesia Post-op Note  Patient: Debbie Joseph  Procedure(s) Performed: Procedure(s) (LRB): RE-EXCISION OF BREAST LUMPECTOMY SUPERIOR AND MEDIAL MARGINS (Right)  Patient Location: PACU  Anesthesia Type: General  Level of Consciousness: awake and alert   Airway and Oxygen Therapy: Patient Spontanous Breathing  Post-op Pain: mild  Post-op Assessment: Post-op Vital signs reviewed, Patient's Cardiovascular Status Stable, Respiratory Function Stable, Patent Airway and No signs of Nausea or vomiting  Last Vitals:  Filed Vitals:   05/15/14 1300  BP: 144/66  Pulse: 74  Temp:   Resp: 14    Post-op Vital Signs: stable   Complications: No apparent anesthesia complications

## 2014-05-15 NOTE — Anesthesia Preprocedure Evaluation (Signed)
Anesthesia Evaluation  Patient identified by MRN, date of birth, ID band Patient awake    Reviewed: Allergy & Precautions, NPO status , Patient's Chart, lab work & pertinent test results  Airway Mallampati: I  TM Distance: >3 FB Neck ROM: Full    Dental  (+) Teeth Intact, Dental Advisory Given   Pulmonary neg pulmonary ROS,  breath sounds clear to auscultation        Cardiovascular hypertension, Pt. on medications negative cardio ROS  Rhythm:Regular Rate:Normal     Neuro/Psych negative neurological ROS  negative psych ROS   GI/Hepatic negative GI ROS, Neg liver ROS,   Endo/Other  negative endocrine ROS  Renal/GU negative Renal ROS  negative genitourinary   Musculoskeletal negative musculoskeletal ROS (+)   Abdominal   Peds negative pediatric ROS (+)  Hematology negative hematology ROS (+)   Anesthesia Other Findings   Reproductive/Obstetrics negative OB ROS                             Anesthesia Physical  Anesthesia Plan  ASA: II  Anesthesia Plan: General   Post-op Pain Management:    Induction: Intravenous  Airway Management Planned: LMA  Additional Equipment:   Intra-op Plan:   Post-operative Plan: Extubation in OR  Informed Consent: I have reviewed the patients History and Physical, chart, labs and discussed the procedure including the risks, benefits and alternatives for the proposed anesthesia with the patient or authorized representative who has indicated his/her understanding and acceptance.   Dental advisory given  Plan Discussed with: CRNA, Anesthesiologist and Surgeon  Anesthesia Plan Comments:         Anesthesia Quick Evaluation

## 2014-05-15 NOTE — Transfer of Care (Signed)
Immediate Anesthesia Transfer of Care Note  Patient: Debbie Joseph  Procedure(s) Performed: Procedure(s): RE-EXCISION OF BREAST LUMPECTOMY SUPERIOR AND MEDIAL MARGINS (Right)  Patient Location: PACU  Anesthesia Type:General  Level of Consciousness: awake, alert  and oriented  Airway & Oxygen Therapy: Patient Spontanous Breathing and Patient connected to face mask oxygen  Post-op Assessment: Report given to PACU RN and Post -op Vital signs reviewed and stable  Post vital signs: Reviewed and stable  Complications: No apparent anesthesia complications

## 2014-05-16 ENCOUNTER — Encounter (HOSPITAL_COMMUNITY): Payer: Self-pay | Admitting: General Surgery

## 2014-05-25 ENCOUNTER — Telehealth: Payer: Self-pay | Admitting: *Deleted

## 2014-05-25 NOTE — Telephone Encounter (Signed)
Received referral from [facility].  Called pt & she informed me that she is a previous pt of Dr. Jana Hakim from 2002.  Confirmed pt to see Dr. Jana Hakim on 3/7 at 4pm.  Mailed before appt letter, calendar, welcoming packet & intake form to pt.  Emailed Engineer, civil (consulting) at [employer] to make her aware.

## 2014-05-30 ENCOUNTER — Ambulatory Visit
Admission: RE | Admit: 2014-05-30 | Discharge: 2014-05-30 | Disposition: A | Discharge status: Home / Self Care | Payer: Medicare Other | Source: Ambulatory Visit | Attending: Radiation Oncology | Admitting: Radiation Oncology

## 2014-05-30 ENCOUNTER — Ambulatory Visit: Payer: Medicare Other

## 2014-05-30 DIAGNOSIS — C50511 Malignant neoplasm of lower-outer quadrant of right female breast: Secondary | ICD-10-CM | POA: Insufficient documentation

## 2014-05-31 ENCOUNTER — Encounter: Payer: Self-pay | Admitting: Radiation Oncology

## 2014-05-31 ENCOUNTER — Encounter: Payer: Self-pay | Admitting: *Deleted

## 2014-05-31 ENCOUNTER — Ambulatory Visit
Admission: RE | Admit: 2014-05-31 | Discharge: 2014-05-31 | Disposition: A | Discharge status: Home / Self Care | Payer: Medicare Other | Source: Ambulatory Visit | Attending: Radiation Oncology | Admitting: Radiation Oncology

## 2014-05-31 VITALS — BP 153/74 | HR 91 | Temp 97.9°F | Resp 16 | Ht 65.0 in | Wt 189.9 lb

## 2014-05-31 DIAGNOSIS — C50511 Malignant neoplasm of lower-outer quadrant of right female breast: Secondary | ICD-10-CM | POA: Insufficient documentation

## 2014-05-31 DIAGNOSIS — Z170 Estrogen receptor positive status [ER+]: Secondary | ICD-10-CM

## 2014-05-31 HISTORY — DX: Malignant neoplasm of unspecified site of right female breast: C50.911

## 2014-05-31 NOTE — Progress Notes (Signed)
Please see the Nurse Progress Note in the MD Initial Consult Encounter for this patient. 

## 2014-05-31 NOTE — Progress Notes (Signed)
Radiation Oncology         [phone] ________________________________  Initial Outpatient Consultation  Name: [name] MRN: [id number]  Date: 05/31/2014  DOB: 1940-12-10  MA:[id number], Debbie Saxon, MD  Excell Seltzer, MD   REFERRING PHYSICIAN: Excell Seltzer, MD  DIAGNOSIS: High-grade intraductal breast cancer presenting in the lower outer quadrant of the right breast  HISTORY OF PRESENT ILLNESS::Debbie Joseph is a 74 y.o. female who is seen out courtesy of Dr. Adonis Housekeeper for an opinion concerning radiation therapy as part of management of patient's recently diagnosed intraductal carcinoma of the right breast. Recently on yearly mammography the patient was noted to have some new calcifications within the lower outer quadrant of the right breast. Biopsy of this area revealed high-grade ductal carcinoma in situ. Patient proceeded to undergo an MRI which showed a 4.5 cm area of clumped nodular segmental enhancement in the lower outer quadrant of the right breast. No other areas were noted within the right breast. Lumpectomy changes were noted in the left breast from the patient's prior breast cancer surgery without evidence of recurrence in the left side. Patient proceeded to undergo excisional biopsy by Dr. Excell Seltzer on 05/08/2014. The patient was found to have high-grade ductal carcinoma in situ with comedonecrosis and associated calcifications spanning over approximately 3 cm. The tumor was less than 2 mm from the superior margin. A excision of additional right medial tissue also revealed high-grade ductal carcinoma in situ with necrosis extending over approximately 1 cm. The margin was extremely close less than a millimeter from this new medial margin. Approximately 1 week later the patient was taken back to the operating room and underwent reexcision of the right breast. Within the right medial margin there was no residual carcinoma noted. Patient also had additional tissue taken from the   superior margin which also showed no evidence of residual carcinoma. Previous excisional site changes were noted in both specimens. Patient has had some soreness in the right breast since her second surgery but otherwise is doing well. Patient's tumor did show estrogen receptor positivity at 88% and progesterone receptor positive at 60%. With this information the patient is now seen in radiation oncology for consideration for breast conserving therapy.[name]  PREVIOUS RADIATION THERAPY: Yes the patient has a prior history of intraductal carcinoma of the left breast, high-grade presenting in the upper inner quadrant. Patient underwent excisional biopsy and postoperative radiation therapy under my direction in 2002. The patient completed her treatments 03/04/2001. The upper inner aspect of the left breast revealed received 62.4 gray. The left breast received 50.4 gray. The patient reports she was on Evista for approximately 10 years after this therapy.   PAST MEDICAL HISTORY:  has a past medical history of Hypertension; Breast cancer; GERD (gastroesophageal reflux disease); Dyspnea on exertion; Family history of adverse reaction to anesthesia; Headache; and Breast cancer, right breast.    PAST SURGICAL HISTORY: Past Surgical History  Procedure Laterality Date  . Partial hysterectomy  1983  . Umbilical hernia repair  1974  . Breast surgery  2002    lt breast lump-cancer  . Appendectomy  1974    with hernia  . Colonoscopy    . Breast lumpectomy with needle localization Right 05/08/2014    Procedure: BREAST LUMPECTOMY WITH NEEDLE LOCALIZATION;  Surgeon: Excell Seltzer, MD;  Location: [place];  Service: General;  Laterality: Right;  . Re-excision of breast lumpectomy Right 05/15/2014    Procedure: RE-EXCISION OF BREAST LUMPECTOMY SUPERIOR AND MEDIAL MARGINS;  Surgeon: Excell Seltzer, MD;  Location: [facility];  Service: General;  Laterality: Right;  . Cataract extraction Bilateral 2009  .  Dental inplant  2015    FAMILY HISTORY: family history includes Colon cancer in her maternal grandmother; Diabetes in her brother and sister; Hypertension in her brother, maternal aunt, maternal grandfather, maternal uncle, and mother; Leukemia in her sister; Lung cancer in her father; Stroke in her maternal grandfather and mother; Tuberculosis in her paternal grandmother; Urolithiasis in her brother. personal history of intraductal carcinoma of the left breast  SOCIAL HISTORY:  reports that she has never smoked. She has never used smokeless tobacco. She reports that she does not drink alcohol or use illicit drugs.   ALLERGIES: Evista  MEDICATIONS:  Current Outpatient Prescriptions  Medication Sig Dispense Refill  . acetaminophen (TYLENOL) 500 MG tablet Take 500 mg by mouth every 6 (six) hours as needed.    . hydroxypropyl methylcellulose / hypromellose (ISOPTO TEARS / GONIOVISC) 2.5 % ophthalmic solution Place 1 drop into both eyes daily as needed for dry eyes.     [name] losartan-hydrochlorothiazide (HYZAAR) 50-12.5 MG per tablet Take 1 tablet by mouth daily. (Patient taking differently: Take 1 tablet by mouth daily with breakfast. ) 90 tablet 1  . omeprazole (PRILOSEC) 20 MG capsule Take 20 mg by mouth daily with breakfast.     . HYDROcodone-acetaminophen (NORCO) 5-325 MG per tablet Take 1-2 tablets by mouth every 4 (four) hours as needed. (Patient not taking: Reported on 05/31/2014) 30 tablet 0  . RED YEAST RICE EXTRACT PO Take 1 tablet by mouth daily.      No current facility-administered medications for this encounter.    REVIEW OF SYSTEMS:  A 15 point review of systems is documented in the electronic medical record. This was obtained by the nursing staff. However, I reviewed this with the patient to discuss relevant findings and make appropriate changes. The patient prior to diagnosis denied any pain within the right breast area nipple discharge or bleeding. She denies any problems with pain  in her left breast nipple discharge or bleeding. Patient denies any new bony pain headaches dizziness or blurred vision. She has had some discomfort in the right breast since her second surgery.   PHYSICAL EXAM:  height is $RemoveB'5\' 5"'MAzBtyxL$  (1.651 m) and weight is 189 lb 14.4 oz (86.138 kg). Her oral temperature is 97.9 F (36.6 C). Her blood pressure is 153/74 and her pulse is 91. Her respiration is 16.   BP 153/74 mmHg  Pulse 91  Temp(Src) 97.9 F (36.6 C) (Oral)  Resp 16  Ht $R'5\' 5"'hK$  (1.651 m)  Wt 189 lb 14.4 oz (86.138 kg)  BMI 31.60 kg/m2  General Appearance:    Alert, cooperative, no distress, appears stated age  Head:    Normocephalic, without obvious abnormality, atraumatic  Eyes:    PERRL, conjunctiva/corneas clear, EOM's intact,      Ears:    Normal TM's and external ear canals, both ears  Nose:   Nares normal, septum midline, mucosa normal, no drainage    or sinus tenderness  Throat:   Lips, mucosa, and tongue normal; teeth and gums normal  Neck:   Supple, symmetrical, trachea midline, no adenopathy;    thyroid:  no enlargement/tenderness/nodules; no carotid   bruit or JVD  Back:     Symmetric, no curvature, ROM normal, no CVA tenderness  Lungs:     Clear to auscultation bilaterally, respirations unlabored  Chest Wall:    No tenderness  or deformity, radiation tattoo present along the sternum area    Heart:    Regular rate and rhythm, S1 and S2 normal, no murmur, rub   or gallop  Breast Exam:    No tenderness, masses, or nipple abnormality involving the left breast., Lumpectomy cavity in the upper inner quadrant of the left breast.; The right breast shows a scar in the lateral aspect approximately the 8:00 position which is vertical and healing well with " surgical glue" in place   Abdomen:     Soft, non-tender, bowel sounds active all four quadrants,    no masses, no organomegaly        Extremities:   Extremities normal, atraumatic, no cyanosis or edema  Pulses:   2+ and symmetric all  extremities  Skin:   Skin color, texture, turgor normal, no rashes or lesions  Lymph nodes:   Cervical, supraclavicular, and axillary nodes normal  Neurologic:    normal strength, sensation and reflexes    throughout     ECOG = 0  0 - Asymptomatic (Fully active, able to carry on all predisease activities without restriction)  LABORATORY DATA:  Lab Results  Component Value Date   WBC 7.7 05/15/2014   HGB 13.7 05/15/2014   HCT 42.4 05/15/2014   MCV 89.6 05/15/2014   PLT 190 05/15/2014   Lab Results  Component Value Date   NA 141 05/15/2014   K 4.3 05/15/2014   CL 106 05/15/2014   CO2 29 05/15/2014   GLUCOSE 127* 05/15/2014   CREATININE 0.74 05/15/2014   CALCIUM 9.1 05/15/2014      RADIOGRAPHY: Dg Chest 2 View  05/15/2014   CLINICAL DATA:  Initial encounter . Preop re-excision of right breast lesion.  EXAM: CHEST  2 VIEW  COMPARISON:  None.  FINDINGS: The lungs are clear. There are no effusions. Pulmonary vasculature is normal. Hilar, mediastinal and cardiac contours appear unremarkable.  No significant skeletal abnormalities are evident.  IMPRESSION: No active cardiopulmonary disease.   Electronically Signed   By: Andreas Newport M.D.   On: 05/15/2014 08:31   Mr Breast Bilateral W Wo Contrast  05/04/2014   CLINICAL DATA:  Biopsy proven right lower outer quadrant DCIS manifesting as 5 cm segmental pleomorphic calcifications detected at screening mammography. Previous remote left lumpectomy for breast cancer.  LABS:  BUN and creatinine were obtained on site at [facility] at  [address]  Results:  BUN 19 mg/dL,  Creatinine 0.8 mg/dL.  EXAM: BILATERAL BREAST MRI WITH AND WITHOUT CONTRAST  TECHNIQUE: Multiplanar, multisequence MR images of both breasts were obtained prior to and following the intravenous administration of 3ml of MultiHance.  THREE-DIMENSIONAL MR IMAGE RENDERING ON INDEPENDENT WORKSTATION:  Three-dimensional MR images were rendered by post-processing  of the original MR data on an independent workstation. The three-dimensional MR images were interpreted, and findings are reported in the following complete MRI report for this study. Three dimensional images were evaluated at the independent DynaCad workstation  COMPARISON:  Prior mammograms  FINDINGS: Breast composition: b.  Scattered fibroglandular tissue.  Background parenchymal enhancement: Mild to moderate  Right breast: Right lower outer quadrant post biopsy change is identified. There is associated segmental clumped nodular enhancement in the right lower outer quadrant measuring 4.5 x 2.1 x 1.8 cm. This corresponds to the approximate extent of mammographically evident calcifications.  Left breast: No mass or abnormal enhancement. Left lumpectomy changes are reidentified.  Lymph nodes: No abnormal appearing lymph nodes.  Ancillary findings:  None.  IMPRESSION: 4.5 cm maximal extent clumped nodular segmental enhancement in the right lower outer quadrant at the site of biopsy proven DCIS. Given the mammographically evident distribution of calcifications, this suggests multicentric disease and preoperative needle localization with bracketing is recommended if additional biopsy to confirm preoperative multicentricity is not planned.  Left lumpectomy changes without MRI evidence for contralateral malignancy.  RECOMMENDATION: Treatment plan  BI-RADS CATEGORY  6: Known biopsy-proven malignancy.   Electronically Signed   By: Conchita Paris M.D.   On: 05/04/2014 16:13   Mm Breast Surgical Specimen  05/08/2014   CLINICAL DATA:  Biopsy proven right breast ductal carcinoma in situ. Pleomorphic calcifications extend 5 cm in the lateral aspect of the right breast. Bracketed needle localization performed.  EXAM: SPECIMEN RADIOGRAPH OF THE RIGHT BREAST  COMPARISON:  Previous exam(s)  FINDINGS: Status post excision of the right breast. The wire tips and marker clip are present and are marked for pathology.  IMPRESSION:  Specimen radiograph of the right breast. Follow-up right mammogram would be suggested to ensure no residual suspicious calcifications are present.   Electronically Signed   By: Lillia Mountain M.D.   On: 05/08/2014 15:26   Mm Radiologist Eval And Mgmt  05/02/2014   EXAM: ESTABLISHED PATIENT OFFICE VISIT  CHIEF COMPLAINT: Underwent right breast biopsy for calcifications yesterday  Current Pain Level: 1-10  HISTORY OF PRESENT ILLNESS: Patient has a history of left DCIS and lumpectomy with radiation therapy. She underwent screening mammography revealing suspicious calcifications on the right. Stereotactic biopsy indicates the presence of intermediate to high grade DCIS in the upper outer quadrant.  PHYSICAL EXAMINATION: No significant hematoma or bleeding at the biopsy site lateral right breast. Mild redness around the biopsy site. Steri-Strips are falling off and will be replaced.  ASSESSMENT AND PLAN: Will replace Steri-Strips. Patient has an appointment for breast MRI on May 04, 2014 in the surgical appointment with Dr. Dalbert Batman the following day.   Electronically Signed   By: Skipper Cliche M.D.   On: 05/02/2014 15:50   Mm Rt Plc Breast Loc Dev   1st Lesion  Inc Mammo Guide  05/08/2014   CLINICAL DATA:  Biopsy proven right breast DCIS.  EXAM: NEEDLE LOCALIZATION OF THE RIGHT BREAST WITH MAMMO GUIDANCE  COMPARISON:  Previous exams.  FINDINGS: Patient presents for needle localization prior to surgery. I met with the patient and we discussed the procedure of needle localization including benefits and alternatives. We discussed the high likelihood of a successful procedure. We discussed the risks of the procedure, including infection, bleeding, tissue injury, and further surgery. Informed, written consent was given. The usual time-out protocol was performed immediately prior to the procedure.  Using mammographic guidance, sterile technique, 2% lidocaine and a two #7 modified Kopans needles, the calcifications in  the lateral aspect of the right breast for bracketed using a lateral to medial approach. The films were marked for Dr. Excell Seltzer.  IMPRESSION: Bracketed needle localization of the right breast. No apparent complications.   Electronically Signed   By: Lillia Mountain M.D.   On: 05/08/2014 15:23      IMPRESSION: High-grade intraductal breast cancer presenting in the lower outer quadrant of the right breast. On initial excision the patient had close surgical margins superior and medial however on reexcision there was no residual malignancy in either of these areas. Patient would be a candidate for breast conservation with radiation therapy directed at the right breast area. The other option for this patient would be  to consider a mastectomy but the patient does wish breast conservation therapy. Patient will meet with Dr. Excell Seltzer tomorrow.  given the clear reexcision margins I doubt that a post surgical mammogram would be of much value but will discuss with Dr. Excell Seltzer.  PLAN: Simulation and planning on March 8 with treatments to begin approximately a week later. The patient will be out of the country until March 3. The patient may be a candidate for hypofractionated accelerated radiation therapy. The patient will also meet with medical oncology in the near future due to discuss adjuvant hormonal therapy.  I spent 60 minutes minutes face to face with the patient and more than 50% of that time was spent in counseling and/or coordination of care.   ------------------------------------------------  Blair Promise, PhD, MD

## 2014-05-31 NOTE — Progress Notes (Signed)
Met with pt prior to Dr. Clabe Seal visit. Gave navigation resources and contact information. Pt denies needs at this time. Discussed journey binder and information within. Encourage pt to call with needs. Received verbal understanding.

## 2014-05-31 NOTE — Progress Notes (Signed)
Location of Breast Cancer:Right Breast, Lower Outer  Histology per Pathology Report:  05/15/14 Diagnosis 1. Breast, excision, right medial NO RESIDUAL CARCINOMA. FOCAL ATYPICAL DUCTAL HYPERPLASIA. PREVIOUS EXCISIONAL SITE CHANGES. ADDITIONAL MEDIAL MARGIN NEGATIVE FOR ATYPIA OR MALIGNANCY. 2. Breast, excision, right superior NO RESIDUAL CARCINOMA. PREVIOUS EXCISIONAL SITE CHANGES. ADDITIONAL SUPERIOR MARGIN NEGATIVE FOR ATYPIA OR MALIGNANCY  05/08/14 Diagnosis 1. Breast, lumpectomy, right - HIGH GRADE DUCTAL CARCINOMA IN SITU WITH COMEDONECROSIS AND ASSOCIATED CALCIFICATIONS SPANNING APPROXIMATELY 3 CM IN GREATEST DIMENSION. - DUCTAL CARCINOMA IN SITU IS CLOSE (LESS THAN 0.2 CM) FROM SUPERIOR MARGIN. - PLEASE SEE SPECIMEN NUMBER TWO FOR MEDIAL MARGIN STATUS. - OTHER MARGINS NEGATIVE. - SEE ONCOLOGY TEMPLATE. 2. Breast, excision, right additional medial margin - HIGH GRADE DUCTAL CARCINOMA IN SITU WITH COMEDONECROSIS, SPANNING APPROXIMATELY 1 CM IN GREATEST DIMENSION. - DUCTAL CARCINOMA IN SITU IS EXTREMELY CLOSE (LESS THAN 0.1 CM) FROM NEW MEDIAL MARGIN. - SEE ONCOLOGY TEMPLATE. .  1/11/16Diagnosis Breast, right, needle core biopsy, lower, outer quadrant - DUCTAL CARCINOMA IN SITU WITH CALCIFICATIONS  Receptor Status: ER(88), PR (60), Her2-neu ()  Did patient present with symptoms (if so, please note symptoms) or was this found on screening mammography?: Recent screening mammogram revealed a new area of calcifications in the lateral aspect of the right breast. Diagnostic mammogram was recommended and magnification views revealed fine pleomorphic calcifications spanning at least 5 cm within the posterior lower outer right breast. No associated mass. Stereotactic large core needle biopsy was recommended and was performed on January 11 which revealed ductal carcinoma in situ, intermediate to high grade, estrogen and progesterone receptor positive. The patient subsequently has had  bilateral breast MRI which has revealed a 4.5 cm area of abnormal enhancement in the right breast corresponding to the known area of calcifications but no other abnormalities in either breast  Past/Anticipated interventions by surgeon, if any: Lumpectomy Right Breast  Past/Anticipated interventions by medical oncology, if any: Hormonal Therapy for left breast cancer diagnosis in 2002  Lymphedema issues, if any:no  Pain issues, if any: yes has soreness in her right breast from re excision.  SAFETY ISSUES:  Prior radiation? Left Breast. Radiation Therapy followed by Hormonal Therapy 01/18/2001 - 03/04/2001 - left upper breast 6240 cGy  Pacemaker/ICD? no  Possible current pregnancy?no  Is the patient on methotrexate? 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  She has 2 adopted children.  history of DCIS of the left breast status post lumpectomy by Dr. Annamaria Boots in 2002 followed by radiation and hormonal therapy for at least 5 years. She had no problems or recurrence with this treatment.

## 2014-06-03 ENCOUNTER — Telehealth (INDEPENDENT_AMBULATORY_CARE_PROVIDER_SITE_OTHER): Payer: Self-pay | Admitting: Surgery

## 2014-06-03 NOTE — Telephone Encounter (Signed)
Debbie Joseph had a right breast lumpectomy on 05/08/2014 and a re-excision on 05/15/2104.  She was getting in her car when she felt a pop in her breast.  The wound is doing okay, she is having a lot of pain.  She is having no fever and she said her incision is unchanged.  Also, she is to go to [place], [place] in a week and is worried about traveling.  She will check with our office Monday, 2/15, if she is not better.  [name], MD, [facility] Surgery Pager: [phone] Office phone:  [phone]

## 2014-06-05 ENCOUNTER — Encounter: Payer: Self-pay | Admitting: *Deleted

## 2014-06-05 NOTE — Progress Notes (Signed)
Chart completed.  Copied notes and took to HIM to scan.  Added to spreadsheet.  Placed chart in Dr. Virgie Dad box.

## 2014-06-07 DIAGNOSIS — H04123 Dry eye syndrome of bilateral lacrimal glands: Secondary | ICD-10-CM | POA: Diagnosis not present

## 2014-06-07 DIAGNOSIS — H43393 Other vitreous opacities, bilateral: Secondary | ICD-10-CM | POA: Diagnosis not present

## 2014-06-08 ENCOUNTER — Encounter (INDEPENDENT_AMBULATORY_CARE_PROVIDER_SITE_OTHER): Payer: Medicare Other | Admitting: Ophthalmology

## 2014-06-08 DIAGNOSIS — I10 Essential (primary) hypertension: Secondary | ICD-10-CM | POA: Diagnosis not present

## 2014-06-08 DIAGNOSIS — H35033 Hypertensive retinopathy, bilateral: Secondary | ICD-10-CM

## 2014-06-08 DIAGNOSIS — H43813 Vitreous degeneration, bilateral: Secondary | ICD-10-CM

## 2014-06-26 ENCOUNTER — Other Ambulatory Visit: Payer: Medicare Other

## 2014-06-26 ENCOUNTER — Ambulatory Visit (HOSPITAL_BASED_OUTPATIENT_CLINIC_OR_DEPARTMENT_OTHER): Payer: Medicare Other | Admitting: Oncology

## 2014-06-26 ENCOUNTER — Ambulatory Visit: Payer: Medicare Other

## 2014-06-26 ENCOUNTER — Encounter: Payer: Self-pay | Admitting: Oncology

## 2014-06-26 VITALS — BP 150/77 | HR 79 | Temp 98.2°F | Resp 18 | Ht 65.0 in | Wt 187.1 lb

## 2014-06-26 DIAGNOSIS — I10 Essential (primary) hypertension: Secondary | ICD-10-CM

## 2014-06-26 DIAGNOSIS — D0511 Intraductal carcinoma in situ of right breast: Secondary | ICD-10-CM

## 2014-06-26 DIAGNOSIS — Z170 Estrogen receptor positive status [ER+]: Secondary | ICD-10-CM

## 2014-06-26 DIAGNOSIS — C50511 Malignant neoplasm of lower-outer quadrant of right female breast: Secondary | ICD-10-CM

## 2014-06-26 NOTE — Progress Notes (Signed)
Checked in new pt with no financial concerns at this time.  Pt has 2 insurances so financial assistance may not be needed but she has Raquel's card for any billing questions or concerns.  ° °

## 2014-06-26 NOTE — Progress Notes (Signed)
[facility]  Telephone:(336) [phone] Fax:(336) [phone]     ID: ZARIELLE CEA DOB: 1940/06/10  MR#: [id number]  CKJ#:[id number]  Patient Care Team: Johny Blamer, MD as PCP - General (Family Medicine) PCP: Johny Blamer, MD GYN: SU: Glenna Fellows  OTHER MD: Antony Blackbird M.D., Nanetta Batty M.D.  CHIEF COMPLAINT: Noninvasive breast cancer  CURRENT TREATMENT: Adjuvant radiation   BREAST CANCER HISTORY: Consuela was first diagnosed with breast cancer in 11/19/2000, when she underwent biopsy of a left breast ductal carcinoma in situ under Francina Ames. She underwent a fuller excision 12/18/2000 and achieve clear margins, although the closest margin remained at 1 mm. She then received radiation therapy and took raloxifene/Evista for approximately 5 years. She discontinued the medication partly because of cost and partly because of concerns regarding arthralgias and myalgias, which however most likely were not related.  She then did fine until 04/17/2014 where bilateral screening mammography with tomography at the breast Center showed new calcifications in the right breast. On 05/01/2014 she underwent right diagnostic mammography which found the breast density to be category C. There were fine pleomorphic calcifications spanning approximately 5 cm in the lower outer quadrant of the right breast. There was no associated mass.  On the same day needle core biopsy was obtained and showed ([id number]) ductal carcinoma in situ, high-grade, estrogen receptor 88% positive, with strong staining intensity; progesterone receptor 60% positive, with moderate staining intensity.  On 05/04/2014 the patient underwent bilateral breast MRI, which showed no findings of interest in the left breast and no abnormal appearing lymph nodes. In the right breast lower outer quadrant there was a 4.57 m area of clumped nodular enhancement. This was felt to correspond closely to the extent of mammographic  calcifications.  Accordingly on 05/08/2014 the patient underwent right lumpectomy with additional right margin resection. The pathology from this procedure ([id number]) showed ductal carcinoma in situ, high-grade, with no invasive component. It measured 3 cm in greatest dimension. There was an additional centimeter of disease associated with the right medial margin portion, but even so margins remained extremely close at less than a millimeter.  Accordingly on 05/15/2014 the patient underwent further surgery for margin clearance. Excision of the right medial and right superior margins found no residual carcinoma.  The patient's subsequent history is as detailed below  INTERVAL HISTORY: Rhealyn was evaluated in the breast clinic 06/26/2014. She did well with the initial surgery, but had some fluid accumulation after the second procedure. Once that was drained she has felt much better and is only minimal soreness in the right breast. Since the surgery also she has met with Dr. Roselind Messier and is planning to proceed to adjuvant radiation within the next week.  REVIEW OF SYSTEMS: There were no specific symptoms leading to the original mammogram, which was routinely scheduled. The patient denies unusual headaches, visual changes, nausea, vomiting, stiff neck, dizziness, or gait imbalance. There has been no cough, phlegm production, or pleurisy, no chest pain or pressure, and no change in bowel or bladder habits. The patient denies fever, bleeding, unexplained fatigue or unexplained weight loss. Interestingly, she developed some hot flashes shortly before her recent surgery and they abated afterwards. She has also developed mild eczema at the base of both thumbs, somehow associated with "all this stress". She has some heartburn and feels deconditioned. She has some numbness in her hands and toes which are chronic. A detailed review of systems today was otherwise entirely negative.  PAST MEDICAL HISTORY: Past  Medical History  Diagnosis Date  . Hypertension   . Breast cancer     left breast  . GERD (gastroesophageal reflux disease)   . Dyspnea on exertion   . Family history of adverse reaction to anesthesia     maternal aunt- slow to wake up   . Headache     hx of migraines greater than 20 years ago   . Breast cancer, right breast     PAST SURGICAL HISTORY: Past Surgical History  Procedure Laterality Date  . Partial hysterectomy  1983  . Umbilical hernia repair  1974  . Breast surgery  2002    lt breast lump-cancer  . Appendectomy  1974    with hernia  . Colonoscopy    . Breast lumpectomy with needle localization Right 05/08/2014    Procedure: BREAST LUMPECTOMY WITH NEEDLE LOCALIZATION;  Surgeon: Excell Seltzer, MD;  Location: [facility];  Service: General;  Laterality: Right;  . Re-excision of breast lumpectomy Right 05/15/2014    Procedure: RE-EXCISION OF BREAST LUMPECTOMY SUPERIOR AND MEDIAL MARGINS;  Surgeon: Excell Seltzer, MD;  Location: [facility];  Service: General;  Laterality: Right;  . Cataract extraction Bilateral 2009  . Dental inplant  2015    FAMILY HISTORY Family History  Problem Relation Age of Onset  . Stroke Mother   . Hypertension Mother   . Lung cancer Father   . Stroke Maternal Grandfather   . Hypertension Maternal Grandfather   . Tuberculosis Paternal Grandmother   . Hypertension Brother   . Diabetes Brother   . Urolithiasis Brother   . Leukemia Sister   . Diabetes Sister   . Hypertension Maternal Aunt   . Hypertension Maternal Uncle   . Colon cancer Maternal Grandmother    the patient's father died from lung cancer at the age of 53, in the setting of tobacco abuse. The patient's mother died at the age of 38. The patient's mother only sister was diagnosed with breast cancer at the age of 31. Cumi herself has 3 brothers and 2 sisters. One of her sisters was diagnosed with acute myeloid leukemia at the age of 57. She survives. A maternal  grandmother may have had colon or other cancer, diagnosed in her 87s. There is no other history of cancer in the family to the patient's knowledge  GYNECOLOGIC HISTORY:  No LMP recorded. Patient has had a hysterectomy. Menarche age 33 or the patient is GX P0. She took hormone replacement for approximately 15 years, and fertility treatments again very briefly. 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 She and her husband do quite a bit of [place] work. 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 There are 2 adopted children are [name] lives in [place] and is currently looking for a job (his will be teaching at [employer] there), and Knollcrest lives in [place] and is a definite card specialist. The patient has no grandchildren.    ADVANCED DIRECTIVES: Not in place; at her 06/26/2014 visit the patient was given the appropriate documents 2 complete and not arise at her discretion.  HEALTH MAINTENANCE: History  Substance Use Topics  . Smoking status: Never Smoker   . Smokeless tobacco: Never Used  . Alcohol Use: No     Colonoscopy: 2008?/[facility]  PAP: Status post hysterectomy  Bone density: 2007/normal  Lipid panel:  Allergies  Allergen Reactions  . Evista [Raloxifene] Other (See Comments)    Made joints hurt "really bad"    Current Outpatient Prescriptions  Medication Sig Dispense Refill  . hydroxypropyl methylcellulose / hypromellose (ISOPTO TEARS / GONIOVISC) 2.5 % ophthalmic solution Place 1 drop into both eyes daily as needed for dry eyes.     [name] losartan-hydrochlorothiazide (HYZAAR) 50-12.5 MG per tablet Take 1 tablet by mouth daily. (Patient taking differently: Take 1 tablet by mouth daily with breakfast. ) 90 tablet 1  . omeprazole (PRILOSEC) 20 MG capsule Take 20 mg by mouth daily with  breakfast.     . RED YEAST RICE EXTRACT PO Take 1 tablet by mouth daily.      No current facility-administered medications for this visit.    OBJECTIVE: Middle-aged white woman in no acute distress Filed Vitals:   06/26/14 1620  BP: 150/77  Pulse: 79  Temp: 98.2 F (36.8 C)  Resp: 18     Body mass index is 31.14 kg/(m^2).    ECOG FS:1 - Symptomatic but completely ambulatory  Ocular: Sclerae unicteric, pupils equal, round and reactive to light Ear-nose-throat: Oropharynx clear and moist Lymphatic: No cervical or supraclavicular adenopathy Lungs no rales or rhonchi, good excursion bilaterally Heart regular rate and rhythm, no murmur appreciated Abd soft, nontender, positive bowel sounds MSK no focal spinal tenderness, no joint edema Neuro: non-focal, well-oriented, appropriate affect Breasts: The right breast is status post recent lumpectomy and reexcision. The cosmetic result is excellent. There is no dehiscence or erythema. There is minimal tenderness. The right axilla is benign. The left breast status post remote lumpectomy and radiation. There is no evidence of local recurrence. The left axilla is benign   LAB RESULTS:  CMP     Component Value Date/Time   NA 141 05/15/2014 0800   K 4.3 05/15/2014 0800   CL 106 05/15/2014 0800   CO2 29 05/15/2014 0800   GLUCOSE 127* 05/15/2014 0800   BUN 22 05/15/2014 0800   CREATININE 0.74 05/15/2014 0800   CREATININE 0.76 02/22/2014 0855   CALCIUM 9.1 05/15/2014 0800   GFRNONAA 83* 05/15/2014 0800   GFRAA >90 05/15/2014 0800    INo results found for: SPEP, UPEP  Lab Results  Component Value Date   WBC 7.7 05/15/2014   HGB 13.7 05/15/2014   HCT 42.4 05/15/2014   MCV 89.6 05/15/2014   PLT 190 05/15/2014      Chemistry      Component Value Date/Time   NA 141 05/15/2014 0800   K 4.3 05/15/2014 0800   CL 106 05/15/2014 0800   CO2 29 05/15/2014 0800   BUN 22 05/15/2014 0800   CREATININE 0.74 05/15/2014 0800   CREATININE  0.76 02/22/2014 0855      Component Value Date/Time   CALCIUM 9.1 05/15/2014 0800       No results found for: LABCA2  No components found for: UTMLY650  No results for input(s): INR in the last 168 hours.  Urinalysis No results found for: COLORURINE, APPEARANCEUR, LABSPEC, PHURINE, GLUCOSEU, HGBUR, BILIRUBINUR, KETONESUR, PROTEINUR, UROBILINOGEN, NITRITE, LEUKOCYTESUR  STUDIES:  CLINICAL DATA: Biopsy proven right lower outer quadrant DCIS manifesting as 5 cm segmental pleomorphic calcifications detected at screening mammography. Previous remote left lumpectomy for breast cancer.  LABS: BUN and creatinine were obtained on site at [facility] at  [address]  Results: BUN 19 mg/dL, Creatinine 0.8 mg/dL.  EXAM: BILATERAL BREAST MRI WITH AND WITHOUT CONTRAST  TECHNIQUE:  Multiplanar, multisequence MR images of both breasts were obtained prior to and following the intravenous administration of 57ml of MultiHance.  THREE-DIMENSIONAL MR IMAGE RENDERING ON INDEPENDENT WORKSTATION:  Three-dimensional MR images were rendered by post-processing of the original MR data on an independent workstation. The three-dimensional MR images were interpreted, and findings are reported in the following complete MRI report for this study. Three dimensional images were evaluated at the independent DynaCad workstation  COMPARISON: Prior mammograms  FINDINGS: Breast composition: b. Scattered fibroglandular tissue.  Background parenchymal enhancement: Mild to moderate  Right breast: Right lower outer quadrant post biopsy change is identified. There is associated segmental clumped nodular enhancement in the right lower outer quadrant measuring 4.5 x 2.1 x 1.8 cm. This corresponds to the approximate extent of mammographically evident calcifications.  Left breast: No mass or abnormal enhancement. Left lumpectomy changes are reidentified.  Lymph nodes:  No abnormal appearing lymph nodes.  Ancillary findings: None.  IMPRESSION: 4.5 cm maximal extent clumped nodular segmental enhancement in the right lower outer quadrant at the site of biopsy proven DCIS. Given the mammographically evident distribution of calcifications, this suggests multicentric disease and preoperative needle localization with bracketing is recommended if additional biopsy to confirm preoperative multicentricity is not planned.  Left lumpectomy changes without MRI evidence for contralateral malignancy.  RECOMMENDATION: Treatment plan  BI-RADS CATEGORY 6: Known biopsy-proven malignancy.   Electronically Signed  By: Conchita Paris M.D.  On: 05/04/2014 16:13    ASSESSMENT: 74 y.o. [place] woman  (1) status post left breast biopsy 11/19/2000 for ductal carcinoma in situ, high-grade followed by reexcision for clear margins 12/18/2000  (a) status post adjuvant radiation to the left breast  (b) received raloxifene for approximately 5 years  (2) status post right breast lower outer quadrant core biopsy 05/01/2014 for ductal carcinoma in situ, high-grade, estrogen receptor 88% positive, progesterone receptor 60% positive  (3)  right breast lumpectomy with additional right medial margin surgery 05/08/2014 showed a 3 cm ductal carcinoma in situ, high-grade, less than 1 mm from the final medial margin  (4) right breast reexcision 05/15/2014 showed no residual ductal carcinoma in situ  (5) adjuvant radiation to follow surgery  (6) to consider anti-estrogens following radiation  PLAN: We spent the better part of today's hour-long appointment discussing the biology of breast cancer in general, and the specifics of the patient's tumor in particular. I reminded lowest that noninvasive breast cancer in itself is not life threatening. The breast cancer cells are trapped in the duct's or tubes where they started. There are not able to travel to the  vital organ. In fact many specialists these days question whether this should be considered a cancer as opposed to a not necessarily malignant dysplastic condition.  Also we discussed the fact that her recent review showed that while there is a relationship between the distance from tumor to margin and local recurrence(with the closer margins showing the higher risks of local recurrence), that relationship does not obtain if the patient with DCIS undergoes radiation. For that reason I was able to reassure her that her risk for local recurrence will be very low when she completes radiation and there is essentially no risk of outside the breast recurrence from this tumor.  We did consider anti-estrogens and the preliminary way. They can further reduce the risk of local recurrence. They will probably reduce the risk of distant recurrence by some amount, possibly as much as 50%, but it is difficult to predict because you're ready took raloxifene  for 5 years. We do know that patients who take tamoxifen for 5 years and follow that with either 5 years of tamoxifen or 5 years of an aromatase inhibitor, have further risk reduction. Accordingly either one of those options would be reasonable for her and I gave her a data sheet so she can start trying to choose which agent she might prefer. In a preliminary manner, since she is status post hysterectomy and status post bilateral cataract surgery, she is thinking tamoxifen might be a better choice for her. Her graft  She will see me again sometime after completing her radiation treatments. We will decide an anti-estrogens at that time. Muntaha has a good understanding of the overall plan. She agrees with it. She knows the goal of treatment in her case is cure. She will call with any problems that may develop before her next visit here.  Chauncey Cruel, MD   06/26/2014 5:39 PM Medical Oncology and Hematology [facility] [address] [place], [place]  [address] Tel. [phone]    Fax. [phone]

## 2014-06-27 ENCOUNTER — Telehealth: Payer: Self-pay | Admitting: Oncology

## 2014-06-27 ENCOUNTER — Ambulatory Visit
Admission: RE | Admit: 2014-06-27 | Discharge: 2014-06-27 | Disposition: A | Discharge status: Home / Self Care | Payer: Medicare Other | Source: Ambulatory Visit | Attending: Radiation Oncology | Admitting: Radiation Oncology

## 2014-06-27 DIAGNOSIS — C50511 Malignant neoplasm of lower-outer quadrant of right female breast: Secondary | ICD-10-CM

## 2014-06-27 NOTE — Telephone Encounter (Signed)
Pt here for rad onc appt,placed note for pt to get a new sch,also pt active on mychart   anne

## 2014-06-28 NOTE — Progress Notes (Signed)
  Radiation Oncology         ([phone] ________________________________  Name: Debbie Joseph MRN: [id number]  Date: 06/27/2014  DOB: [birth year]  SIMULATION AND TREATMENT PLANNING NOTE    ICD-9-CM ICD-10-CM   1. Breast cancer of lower-outer quadrant of right female breast 174.5 C50.511     DIAGNOSIS:High-grade intraductal breast cancer presenting in the lower outer quadrant of the right breast    NARRATIVE:  The patient was brought to the [facility].  Identity was confirmed.  All relevant records and images related to the planned course of therapy were reviewed.  The patient freely provided informed written consent to proceed with treatment after reviewing the details related to the planned course of therapy. The consent form was witnessed and verified by the simulation staff.  Then, the patient was set-up in a stable reproducible  supine position for radiation therapy.  CT images were obtained.  Surface markings were placed.  The CT images were loaded into the planning software.  Then the target and avoidance structures were contoured.  Treatment planning then occurred.  The radiation prescription was entered and confirmed.  Then, I designed and supervised the construction of a total of 3 medically necessary complex treatment devices.  I have requested : 3D Simulation  I have requested a DVH of the following structures: lumpectomy cavity, heart, lungs.  I have ordered:dose calc.  PLAN:  The patient will receive 50.4 Gy in 28 fractions followed by a boost to the lumpectomy cavity of 10 gray and a cumulative dose to this area of 60.4 gray. Hypo fractionated treatment will not be used in light of the potential overlap with her previous left breast radiation therapy.  ________________________________ Special treatment procedure note  The patient has received previous radiation therapy directed at the left breast area. Additional time was taken in reviewing the patient's previous  set up as it relates to her current treatment and assessment of potential overlap. Given this additional time in reviewing, this constitutes a special treatment procedure. -----------------------------------  Blair Promise, PhD, MD

## 2014-06-29 DIAGNOSIS — C50511 Malignant neoplasm of lower-outer quadrant of right female breast: Secondary | ICD-10-CM | POA: Diagnosis not present

## 2014-07-03 ENCOUNTER — Ambulatory Visit
Admission: RE | Admit: 2014-07-03 | Discharge: 2014-07-03 | Disposition: A | Discharge status: Home / Self Care | Payer: Medicare Other | Source: Ambulatory Visit | Attending: Radiation Oncology | Admitting: Radiation Oncology

## 2014-07-03 DIAGNOSIS — C50511 Malignant neoplasm of lower-outer quadrant of right female breast: Secondary | ICD-10-CM | POA: Diagnosis not present

## 2014-07-03 MED ORDER — ALRA NON-METALLIC DEODORANT (RAD-ONC)
1.0000 "application " | Freq: Once | TOPICAL | Status: AC
  Administered 2014-07-03: 1 via TOPICAL

## 2014-07-03 MED ORDER — RADIAPLEXRX EX GEL
Freq: Once | CUTANEOUS | Status: AC
Start: 2014-07-03 — End: 2014-07-03
  Administered 2014-07-03: 13:00:00 via TOPICAL

## 2014-07-03 NOTE — Progress Notes (Signed)
Pt here for patient teaching.  Radiation and You booklet given with areas of pertinence flagged and highlighted.  Reviewed fatigue, skin changes, breast tenderness and breast swelling . Pt able to give teach back of to pat skin,apply Radiaplex bid and avoid applying anything to skin within 4 hours of treatment. Patient was also given Alra Deoderant.  Pt demonstrated understanding and verbalizes understanding of information given and will contact nursing with any questions or concerns.

## 2014-07-04 ENCOUNTER — Ambulatory Visit: Payer: Medicare Other | Attending: Radiation Oncology | Admitting: Radiation Oncology

## 2014-07-04 ENCOUNTER — Ambulatory Visit: Payer: Medicare Other

## 2014-07-04 ENCOUNTER — Inpatient Hospital Stay
Admission: RE | Admit: 2014-07-04 | Discharge: 2014-07-04 | Disposition: A | Discharge status: Home / Self Care | Payer: Medicare Other | Source: Ambulatory Visit | Attending: Radiation Oncology | Admitting: Radiation Oncology

## 2014-07-04 ENCOUNTER — Ambulatory Visit
Admission: RE | Admit: 2014-07-04 | Discharge: 2014-07-04 | Disposition: A | Discharge status: Home / Self Care | Payer: Medicare Other | Source: Ambulatory Visit | Attending: Radiation Oncology | Admitting: Radiation Oncology

## 2014-07-05 ENCOUNTER — Ambulatory Visit
Admission: RE | Admit: 2014-07-05 | Discharge: 2014-07-05 | Disposition: A | Discharge status: Home / Self Care | Payer: Medicare Other | Source: Ambulatory Visit | Attending: Radiation Oncology | Admitting: Radiation Oncology

## 2014-07-05 ENCOUNTER — Ambulatory Visit: Payer: Medicare Other

## 2014-07-05 DIAGNOSIS — C50511 Malignant neoplasm of lower-outer quadrant of right female breast: Secondary | ICD-10-CM | POA: Diagnosis not present

## 2014-07-06 ENCOUNTER — Ambulatory Visit
Admission: RE | Admit: 2014-07-06 | Discharge: 2014-07-06 | Disposition: A | Discharge status: Home / Self Care | Payer: Medicare Other | Source: Ambulatory Visit | Attending: Radiation Oncology | Admitting: Radiation Oncology

## 2014-07-06 DIAGNOSIS — C50511 Malignant neoplasm of lower-outer quadrant of right female breast: Secondary | ICD-10-CM | POA: Diagnosis not present

## 2014-07-07 ENCOUNTER — Ambulatory Visit
Admission: RE | Admit: 2014-07-07 | Discharge: 2014-07-07 | Disposition: A | Discharge status: Home / Self Care | Payer: Medicare Other | Source: Ambulatory Visit | Attending: Radiation Oncology | Admitting: Radiation Oncology

## 2014-07-07 DIAGNOSIS — C50511 Malignant neoplasm of lower-outer quadrant of right female breast: Secondary | ICD-10-CM | POA: Diagnosis not present

## 2014-07-10 ENCOUNTER — Ambulatory Visit
Admission: RE | Admit: 2014-07-10 | Discharge: 2014-07-10 | Disposition: A | Discharge status: Home / Self Care | Payer: Medicare Other | Source: Ambulatory Visit | Attending: Radiation Oncology | Admitting: Radiation Oncology

## 2014-07-10 DIAGNOSIS — C50511 Malignant neoplasm of lower-outer quadrant of right female breast: Secondary | ICD-10-CM | POA: Diagnosis not present

## 2014-07-11 ENCOUNTER — Encounter: Payer: Self-pay | Admitting: Radiation Oncology

## 2014-07-11 ENCOUNTER — Ambulatory Visit
Admission: RE | Admit: 2014-07-11 | Discharge: 2014-07-11 | Disposition: A | Discharge status: Home / Self Care | Payer: Medicare Other | Source: Ambulatory Visit | Attending: Radiation Oncology | Admitting: Radiation Oncology

## 2014-07-11 VITALS — BP 146/61 | HR 87 | Temp 97.8°F | Resp 20 | Wt 188.3 lb

## 2014-07-11 DIAGNOSIS — C50511 Malignant neoplasm of lower-outer quadrant of right female breast: Secondary | ICD-10-CM

## 2014-07-11 NOTE — Progress Notes (Signed)
Patient denies pain, fatigue, loss of appetite. She is applying Radiaplex to right breast treatment area; no skin changes at this time. BP 146/61 mmHg  Pulse 87  Temp(Src) 97.8 F (36.6 C) (Oral)  Resp 20  Wt 188 lb 4.8 oz (85.412 kg)

## 2014-07-11 NOTE — Progress Notes (Signed)
  Radiation Oncology         [phone] ________________________________  Name: Debbie Joseph MRN: [id number]  Date: 07/11/2014  DOB: November 15, 1940  Weekly Radiation Therapy Management  DIAGNOSIS:High-grade intraductal breast cancer presenting in the lower outer quadrant of the right breast   Current Dose: 9 Gy     Planned Dose:  60.4 Gy  Narrative . . . . . . . . The patient presents for routine under treatment assessment.                                   The patient is without complaint.                                 Set-up films were reviewed.                                 The chart was checked. Physical Findings. . .  weight is 188 lb 4.8 oz (85.412 kg). Her oral temperature is 97.8 F (36.6 C). Her blood pressure is 146/61 and her pulse is 87. Her respiration is 20. . The lungs are clear. The heart has regular rhythm and rate. The right breast area shows no appreciable radiation reaction at this time. Impression . . . . . . . The patient is tolerating radiation. Plan . . . . . . . . . . . . Continue treatment as planned.  ________________________________   Blair Promise, PhD, MD

## 2014-07-12 ENCOUNTER — Encounter: Payer: Self-pay | Admitting: *Deleted

## 2014-07-12 ENCOUNTER — Ambulatory Visit
Admission: RE | Admit: 2014-07-12 | Discharge: 2014-07-12 | Disposition: A | Discharge status: Home / Self Care | Payer: Medicare Other | Source: Ambulatory Visit | Attending: Radiation Oncology | Admitting: Radiation Oncology

## 2014-07-12 DIAGNOSIS — C50511 Malignant neoplasm of lower-outer quadrant of right female breast: Secondary | ICD-10-CM | POA: Diagnosis not present

## 2014-07-12 NOTE — Progress Notes (Signed)
Met with pt on 07/11/14 for wkly treat with Dr. Sondra Come. Relate doing well. Denies needs. Encourage pt to call with questions or concerns. Received verbal understanding.

## 2014-07-13 ENCOUNTER — Ambulatory Visit: Admission: RE | Admit: 2014-07-13 | Payer: Medicare Other | Source: Ambulatory Visit

## 2014-07-14 ENCOUNTER — Ambulatory Visit
Admission: RE | Admit: 2014-07-14 | Discharge: 2014-07-14 | Disposition: A | Discharge status: Home / Self Care | Payer: Medicare Other | Source: Ambulatory Visit | Attending: Radiation Oncology | Admitting: Radiation Oncology

## 2014-07-14 DIAGNOSIS — C50511 Malignant neoplasm of lower-outer quadrant of right female breast: Secondary | ICD-10-CM | POA: Diagnosis not present

## 2014-07-17 ENCOUNTER — Ambulatory Visit
Admission: RE | Admit: 2014-07-17 | Discharge: 2014-07-17 | Disposition: A | Discharge status: Home / Self Care | Payer: Medicare Other | Source: Ambulatory Visit | Attending: Radiation Oncology | Admitting: Radiation Oncology

## 2014-07-17 DIAGNOSIS — C50511 Malignant neoplasm of lower-outer quadrant of right female breast: Secondary | ICD-10-CM | POA: Diagnosis not present

## 2014-07-18 ENCOUNTER — Ambulatory Visit
Admission: RE | Admit: 2014-07-18 | Discharge: 2014-07-18 | Disposition: A | Discharge status: Home / Self Care | Payer: Medicare Other | Source: Ambulatory Visit | Attending: Radiation Oncology | Admitting: Radiation Oncology

## 2014-07-18 ENCOUNTER — Encounter: Payer: Self-pay | Admitting: Radiation Oncology

## 2014-07-18 VITALS — BP 139/71 | HR 83 | Resp 16 | Wt 186.3 lb

## 2014-07-18 DIAGNOSIS — C50511 Malignant neoplasm of lower-outer quadrant of right female breast: Secondary | ICD-10-CM

## 2014-07-18 NOTE — Progress Notes (Signed)
Reports using radiaplex bid as directed. Denies skin irritation within treatment field. Denies fatigue. Reports an occasional dry cough. Denies pain. Reports right breast remains sensitive.

## 2014-07-18 NOTE — Progress Notes (Signed)
  Radiation Oncology         [phone] ________________________________  Name: Debbie Joseph MRN: [id number]  Date: 07/18/2014  DOB: 20-Jul-1940  Weekly Radiation Therapy Management  DIAGNOSIS:High-grade intraductal breast cancer presenting in the lower outer quadrant of the right breast    Current Dose: 16.2 Gy     Planned Dose:  60.4 Gy  Narrative . . . . . . . . The patient presents for routine under treatment assessment.                                   The patient is without complaint. She has some mild sensitivity in the breast but has had this since her surgery                                 Set-up films were reviewed.                                 The chart was checked. Physical Findings. . .  weight is 186 lb 4.8 oz (84.505 kg). Her blood pressure is 139/71 and her pulse is 83. Her respiration is 16. . The lungs are clear. The heart has a regular rhythm and rate. Examination of the right breast reveals some slight erythema. Impression . . . . . . . The patient is tolerating radiation. Plan . . . . . . . . . . . . Continue treatment as planned.  ________________________________   Blair Promise, PhD, MD

## 2014-07-19 ENCOUNTER — Ambulatory Visit: Admission: RE | Admit: 2014-07-19 | Payer: Medicare Other | Source: Ambulatory Visit

## 2014-07-19 ENCOUNTER — Ambulatory Visit
Admission: RE | Admit: 2014-07-19 | Discharge: 2014-07-19 | Disposition: A | Discharge status: Home / Self Care | Payer: Medicare Other | Source: Ambulatory Visit | Attending: Radiation Oncology | Admitting: Radiation Oncology

## 2014-07-19 DIAGNOSIS — C50511 Malignant neoplasm of lower-outer quadrant of right female breast: Secondary | ICD-10-CM | POA: Diagnosis not present

## 2014-07-20 ENCOUNTER — Ambulatory Visit: Payer: Medicare Other

## 2014-07-20 ENCOUNTER — Ambulatory Visit
Admission: RE | Admit: 2014-07-20 | Discharge: 2014-07-20 | Disposition: A | Discharge status: Home / Self Care | Payer: Medicare Other | Source: Ambulatory Visit | Attending: Radiation Oncology | Admitting: Radiation Oncology

## 2014-07-20 DIAGNOSIS — C50511 Malignant neoplasm of lower-outer quadrant of right female breast: Secondary | ICD-10-CM | POA: Diagnosis not present

## 2014-07-21 ENCOUNTER — Ambulatory Visit: Payer: Medicare Other

## 2014-07-24 ENCOUNTER — Ambulatory Visit
Admission: RE | Admit: 2014-07-24 | Discharge: 2014-07-24 | Disposition: A | Discharge status: Home / Self Care | Payer: Medicare Other | Source: Ambulatory Visit | Attending: Radiation Oncology | Admitting: Radiation Oncology

## 2014-07-24 DIAGNOSIS — C50511 Malignant neoplasm of lower-outer quadrant of right female breast: Secondary | ICD-10-CM | POA: Diagnosis not present

## 2014-07-25 ENCOUNTER — Ambulatory Visit
Admission: RE | Admit: 2014-07-25 | Discharge: 2014-07-25 | Disposition: A | Discharge status: Home / Self Care | Payer: Medicare Other | Source: Ambulatory Visit | Attending: Radiation Oncology | Admitting: Radiation Oncology

## 2014-07-25 ENCOUNTER — Encounter: Payer: Self-pay | Admitting: Radiation Oncology

## 2014-07-25 VITALS — BP 146/74 | HR 81 | Temp 98.0°F | Resp 16 | Wt 186.6 lb

## 2014-07-25 DIAGNOSIS — C50511 Malignant neoplasm of lower-outer quadrant of right female breast: Secondary | ICD-10-CM

## 2014-07-25 NOTE — Progress Notes (Signed)
Faint hyperpigmentation without desquamation at right mammary fold and axilla. Mild edema of right axilla noted. Reports using radiaplex and alra as directed. Reports mild fatigue. Denies breast pain or nipple discharge. No fever felt in right breast.

## 2014-07-25 NOTE — Progress Notes (Signed)
  Radiation Oncology         [phone] ________________________________  Name: Debbie Joseph MRN: [id number]  Date: 07/25/2014  DOB: 10/05/1940  Weekly Radiation Therapy Management  DIAGNOSIS:High-grade intraductal breast cancer presenting in the lower outer quadrant of the right breast   Current Dose: 23.4 Gy     Planned Dose:  60.4 Gy  Narrative . . . . . . . . The patient presents for routine under treatment assessment.                                   The patient is without complaint. She has mild fatigue but is not interfering with daily activities. No itching or discomfort in the breast                                 Set-up films were reviewed.                                 The chart was checked. Physical Findings. . .  weight is 186 lb 9.6 oz (84.641 kg). Her oral temperature is 98 F (36.7 C). Her blood pressure is 146/74 and her pulse is 81. Her respiration is 16 and oxygen saturation is 100%. . The right breast area shows some mild hyperpigmentation changes and mild erythema Impression . . . . . . . The patient is tolerating radiation. Plan . . . . . . . . . . . . Continue treatment as planned.  ________________________________   Blair Promise, PhD, MD

## 2014-07-26 ENCOUNTER — Ambulatory Visit
Admission: RE | Admit: 2014-07-26 | Discharge: 2014-07-26 | Disposition: A | Discharge status: Home / Self Care | Payer: Medicare Other | Source: Ambulatory Visit | Attending: Radiation Oncology | Admitting: Radiation Oncology

## 2014-07-26 ENCOUNTER — Ambulatory Visit: Payer: Medicare Other

## 2014-07-26 DIAGNOSIS — C50511 Malignant neoplasm of lower-outer quadrant of right female breast: Secondary | ICD-10-CM | POA: Diagnosis not present

## 2014-07-27 ENCOUNTER — Ambulatory Visit
Admission: RE | Admit: 2014-07-27 | Discharge: 2014-07-27 | Disposition: A | Discharge status: Home / Self Care | Payer: Medicare Other | Source: Ambulatory Visit | Attending: Radiation Oncology | Admitting: Radiation Oncology

## 2014-07-27 DIAGNOSIS — C50511 Malignant neoplasm of lower-outer quadrant of right female breast: Secondary | ICD-10-CM | POA: Diagnosis not present

## 2014-07-28 ENCOUNTER — Ambulatory Visit
Admission: RE | Admit: 2014-07-28 | Discharge: 2014-07-28 | Disposition: A | Discharge status: Home / Self Care | Payer: Medicare Other | Source: Ambulatory Visit | Attending: Radiation Oncology | Admitting: Radiation Oncology

## 2014-07-28 DIAGNOSIS — C50511 Malignant neoplasm of lower-outer quadrant of right female breast: Secondary | ICD-10-CM | POA: Diagnosis not present

## 2014-07-31 ENCOUNTER — Ambulatory Visit
Admission: RE | Admit: 2014-07-31 | Discharge: 2014-07-31 | Disposition: A | Discharge status: Home / Self Care | Payer: Medicare Other | Source: Ambulatory Visit | Attending: Radiation Oncology | Admitting: Radiation Oncology

## 2014-07-31 DIAGNOSIS — C50511 Malignant neoplasm of lower-outer quadrant of right female breast: Secondary | ICD-10-CM | POA: Diagnosis not present

## 2014-08-01 ENCOUNTER — Ambulatory Visit: Payer: Medicare Other

## 2014-08-01 ENCOUNTER — Encounter: Payer: Self-pay | Admitting: Radiation Oncology

## 2014-08-01 ENCOUNTER — Ambulatory Visit
Admission: RE | Admit: 2014-08-01 | Discharge: 2014-08-01 | Disposition: A | Discharge status: Home / Self Care | Payer: Medicare Other | Source: Ambulatory Visit | Attending: Radiation Oncology | Admitting: Radiation Oncology

## 2014-08-01 VITALS — BP 145/73 | HR 87 | Temp 98.2°F | Resp 16 | Ht 65.0 in | Wt 187.1 lb

## 2014-08-01 DIAGNOSIS — C50511 Malignant neoplasm of lower-outer quadrant of right female breast: Secondary | ICD-10-CM | POA: Diagnosis not present

## 2014-08-01 NOTE — Progress Notes (Signed)
  Radiation Oncology         [phone] ________________________________  Name: Debbie Joseph MRN: [id number]  Date: 08/01/2014  DOB: [birth year]  Weekly Radiation Therapy Management  DIAGNOSIS:High-grade intraductal breast cancer presenting in the lower outer quadrant of the right breast    Current Dose: 32.4 Gy     Planned Dose:  60.4 Gy  Narrative . . . . . . . . The patient presents for routine under treatment assessment.                                   The patient is without complaint. She has some mild sensitivity within the breast to clear the nipple area. She has mild fatigue late in the afternoon.                                 Set-up films were reviewed.                                 The chart was checked. Physical Findings. . .  height is 5\' 5"  (1.651 m) and weight is 187 lb 1.6 oz (84.868 kg). Her oral temperature is 98.2 F (36.8 C). Her blood pressure is 145/73 and her pulse is 87. Her respiration is 16. . The lungs are clear. The heart has a regular rhythm and rate. The right breast area shows minimal erythema in the inframammary fold and low axillary region. Impression . . . . . . . The patient is tolerating radiation. Plan . . . . . . . . . . . . Continue treatment as planned.  ________________________________   Blair Promise, PhD, MD

## 2014-08-01 NOTE — Progress Notes (Signed)
Debbie Joseph has completed 18 fractions to her right breast.  She denies pain but does have occasional twinges of pain in her right breast.  She reports that her right nipple has been sensitive since surgery.  She reports fatigue.   The skin on her right breast is red underneath the breast.  She also has redness under her right arm.  She is using radiaplex and has requested a refill.  Another tube has been given.  BP 145/73 mmHg  Pulse 87  Temp(Src) 98.2 F (36.8 C) (Oral)  Resp 16  Ht 5\' 5"  (1.651 m)  Wt 187 lb 1.6 oz (84.868 kg)  BMI 31.14 kg/m2

## 2014-08-01 NOTE — Progress Notes (Signed)
  Radiation Oncology         [phone] ________________________________  Name: Debbie Joseph MRN: [id number]  Date: 08/01/2014  DOB: [birth year]  Simulation  Note    ICD-9-CM ICD-10-CM   1. Breast cancer of lower-outer quadrant of right female breast 174.5 C50.511     Status: outpatient  NARRATIVE: Today the patient underwent additional planning for radiation therapy directed at the right breast area. Patient's treatment planning CT scan was reviewed and she had set up of a boost field directed at the lumpectomy cavity within the breast. Given the depth within the breast the patient will be treated with a 3 field photon boost. Custom blocking will be used on all 3 fields. A computerized isodose plan will be generated for treatment.  Plan the patient will receive 5 additional treatments at 2 gray per fraction for a boost dose of 10 gray.  -----------------------------------  Blair Promise, PhD, MD

## 2014-08-02 ENCOUNTER — Ambulatory Visit
Admission: RE | Admit: 2014-08-02 | Discharge: 2014-08-02 | Disposition: A | Discharge status: Home / Self Care | Payer: Medicare Other | Source: Ambulatory Visit | Attending: Radiation Oncology | Admitting: Radiation Oncology

## 2014-08-02 DIAGNOSIS — C50511 Malignant neoplasm of lower-outer quadrant of right female breast: Secondary | ICD-10-CM | POA: Diagnosis not present

## 2014-08-03 ENCOUNTER — Ambulatory Visit
Admission: RE | Admit: 2014-08-03 | Discharge: 2014-08-03 | Disposition: A | Discharge status: Home / Self Care | Payer: Medicare Other | Source: Ambulatory Visit | Attending: Radiation Oncology | Admitting: Radiation Oncology

## 2014-08-03 DIAGNOSIS — C50511 Malignant neoplasm of lower-outer quadrant of right female breast: Secondary | ICD-10-CM | POA: Diagnosis not present

## 2014-08-04 ENCOUNTER — Ambulatory Visit
Admission: RE | Admit: 2014-08-04 | Discharge: 2014-08-04 | Disposition: A | Discharge status: Home / Self Care | Payer: Medicare Other | Source: Ambulatory Visit | Attending: Radiation Oncology | Admitting: Radiation Oncology

## 2014-08-04 DIAGNOSIS — C50511 Malignant neoplasm of lower-outer quadrant of right female breast: Secondary | ICD-10-CM | POA: Diagnosis not present

## 2014-08-07 ENCOUNTER — Ambulatory Visit
Admission: RE | Admit: 2014-08-07 | Discharge: 2014-08-07 | Disposition: A | Discharge status: Home / Self Care | Payer: Medicare Other | Source: Ambulatory Visit | Attending: Radiation Oncology | Admitting: Radiation Oncology

## 2014-08-07 DIAGNOSIS — C50511 Malignant neoplasm of lower-outer quadrant of right female breast: Secondary | ICD-10-CM | POA: Diagnosis not present

## 2014-08-08 ENCOUNTER — Ambulatory Visit
Admission: RE | Admit: 2014-08-08 | Discharge: 2014-08-08 | Disposition: A | Discharge status: Home / Self Care | Payer: Medicare Other | Source: Ambulatory Visit | Attending: Radiation Oncology | Admitting: Radiation Oncology

## 2014-08-08 ENCOUNTER — Encounter: Payer: Self-pay | Admitting: Radiation Oncology

## 2014-08-08 VITALS — BP 147/74 | HR 79 | Resp 16 | Wt 185.1 lb

## 2014-08-08 DIAGNOSIS — C50511 Malignant neoplasm of lower-outer quadrant of right female breast: Secondary | ICD-10-CM

## 2014-08-08 NOTE — Progress Notes (Signed)
  Radiation Oncology         [phone] ________________________________  Name: Debbie Joseph MRN: [id number]  Date: 08/08/2014  DOB: [birth year]  Weekly Radiation Therapy Management  DIAGNOSIS: High-grade intraductal breast cancer presenting in the lower outer quadrant of the right breast   Current Dose: 41.4 Gy     Planned Dose:  60.4 Gy  Narrative . . . . . . . . The patient presents for routine under treatment assessment.                                   The patient is without complaint. She is having a lot of discomfort in the low axillary area as well as some mild fatigue.                                 Set-up films were reviewed.                                 The chart was checked. Physical Findings. . .  weight is 185 lb 1.6 oz (83.961 kg). Her blood pressure is 147/74 and her pulse is 79. Her respiration is 16. . Weight essentially stable.  Erythema noted particularly in the low axillary region. No skin breakdown appreciated. Impression . . . . . . . The patient is tolerating radiation. Plan . . . . . . . . . . . . Continue treatment as planned.  ________________________________   Blair Promise, PhD, MD

## 2014-08-08 NOTE — Progress Notes (Signed)
Faint hyperpigmentation of right axilla and right mammary fold without desquamation. Reports using radiaplex bid as directed. Reports mild fatigue but, continues to remain active. Weight and vitals stable. Denies pain.

## 2014-08-09 ENCOUNTER — Ambulatory Visit
Admission: RE | Admit: 2014-08-09 | Discharge: 2014-08-09 | Disposition: A | Discharge status: Home / Self Care | Payer: Medicare Other | Source: Ambulatory Visit | Attending: Radiation Oncology | Admitting: Radiation Oncology

## 2014-08-09 DIAGNOSIS — C50511 Malignant neoplasm of lower-outer quadrant of right female breast: Secondary | ICD-10-CM | POA: Diagnosis not present

## 2014-08-10 ENCOUNTER — Ambulatory Visit
Admission: RE | Admit: 2014-08-10 | Discharge: 2014-08-10 | Disposition: A | Discharge status: Home / Self Care | Payer: Medicare Other | Source: Ambulatory Visit | Attending: Radiation Oncology | Admitting: Radiation Oncology

## 2014-08-10 DIAGNOSIS — C50511 Malignant neoplasm of lower-outer quadrant of right female breast: Secondary | ICD-10-CM | POA: Diagnosis not present

## 2014-08-11 ENCOUNTER — Ambulatory Visit: Payer: Medicare Other

## 2014-08-11 ENCOUNTER — Ambulatory Visit
Admission: RE | Admit: 2014-08-11 | Discharge: 2014-08-11 | Disposition: A | Discharge status: Home / Self Care | Payer: Medicare Other | Source: Ambulatory Visit | Attending: Radiation Oncology | Admitting: Radiation Oncology

## 2014-08-11 DIAGNOSIS — C50511 Malignant neoplasm of lower-outer quadrant of right female breast: Secondary | ICD-10-CM | POA: Diagnosis not present

## 2014-08-14 ENCOUNTER — Ambulatory Visit: Payer: Medicare Other

## 2014-08-14 DIAGNOSIS — C50511 Malignant neoplasm of lower-outer quadrant of right female breast: Secondary | ICD-10-CM | POA: Diagnosis not present

## 2014-08-15 ENCOUNTER — Ambulatory Visit: Payer: Medicare Other

## 2014-08-15 ENCOUNTER — Ambulatory Visit
Admission: RE | Admit: 2014-08-15 | Discharge: 2014-08-15 | Disposition: A | Discharge status: Home / Self Care | Payer: Medicare Other | Source: Ambulatory Visit | Attending: Radiation Oncology | Admitting: Radiation Oncology

## 2014-08-15 ENCOUNTER — Encounter: Payer: Self-pay | Admitting: Radiation Oncology

## 2014-08-15 VITALS — BP 154/70 | HR 75 | Temp 98.2°F | Resp 16 | Ht 65.0 in | Wt 185.7 lb

## 2014-08-15 DIAGNOSIS — C50511 Malignant neoplasm of lower-outer quadrant of right female breast: Secondary | ICD-10-CM | POA: Diagnosis not present

## 2014-08-15 NOTE — Progress Notes (Signed)
  Radiation Oncology         [phone] ________________________________  Name: Debbie Joseph MRN: [id number]  Date: 08/15/2014  DOB: 1940-09-02  Weekly Radiation Therapy Management  DIAGNOSIS: High-grade intraductal breast cancer presenting in the lower outer quadrant of the right breast   Current Dose: 50.4 Gy     Planned Dose:  60.4 Gy  Narrative . . . . . . . . The patient presents for routine under treatment assessment.                                   The patient does have some itching within the breast. She also notices soreness along the axillary region her arm up against the breast area.                                  Set-up films were reviewed.                                 The chart was checked. Physical Findings. . .  height is 5\' 5"  (1.651 m) and weight is 185 lb 11.2 oz (84.233 kg). Her oral temperature is 98.2 F (36.8 C). Her blood pressure is 154/70 and her pulse is 75. Her respiration is 16. . The lungs are clear. The heart has a regular rhythm and rate. The right breast area shows erythema particularly in the inframammary fold and low axillary region. No moist desquamation Impression . . . . . . . The patient is tolerating radiation. Plan . . . . . . . . . . . . Continue treatment as planned.  ________________________________   Blair Promise, PhD, MD

## 2014-08-15 NOTE — Progress Notes (Signed)
Debbie Joseph has completed 28 fractions to her right breast.  She denies pain.  She reports fatigue and feeling down due to the death of a close friend today.  The skin on her right chest is red.  She reports that it is itching.  She is using radiaplex.  Advised her to use hydrocortisone cream as needed.  BP 154/70 mmHg  Pulse 75  Temp(Src) 98.2 F (36.8 C) (Oral)  Resp 16  Ht 5\' 5"  (1.651 m)  Wt 185 lb 11.2 oz (84.233 kg)  BMI 30.90 kg/m2

## 2014-08-16 ENCOUNTER — Ambulatory Visit
Admission: RE | Admit: 2014-08-16 | Discharge: 2014-08-16 | Disposition: A | Discharge status: Home / Self Care | Payer: Medicare Other | Source: Ambulatory Visit | Attending: Radiation Oncology | Admitting: Radiation Oncology

## 2014-08-16 DIAGNOSIS — C50511 Malignant neoplasm of lower-outer quadrant of right female breast: Secondary | ICD-10-CM | POA: Diagnosis not present

## 2014-08-17 ENCOUNTER — Ambulatory Visit: Payer: Medicare Other

## 2014-08-17 ENCOUNTER — Ambulatory Visit
Admission: RE | Admit: 2014-08-17 | Discharge: 2014-08-17 | Disposition: A | Discharge status: Home / Self Care | Payer: Medicare Other | Source: Ambulatory Visit | Attending: Radiation Oncology | Admitting: Radiation Oncology

## 2014-08-17 DIAGNOSIS — C50511 Malignant neoplasm of lower-outer quadrant of right female breast: Secondary | ICD-10-CM | POA: Diagnosis not present

## 2014-08-18 ENCOUNTER — Ambulatory Visit
Admission: RE | Admit: 2014-08-18 | Discharge: 2014-08-18 | Disposition: A | Discharge status: Home / Self Care | Payer: Medicare Other | Source: Ambulatory Visit | Attending: Radiation Oncology | Admitting: Radiation Oncology

## 2014-08-18 ENCOUNTER — Ambulatory Visit: Payer: Medicare Other

## 2014-08-18 DIAGNOSIS — C50511 Malignant neoplasm of lower-outer quadrant of right female breast: Secondary | ICD-10-CM | POA: Diagnosis not present

## 2014-08-21 ENCOUNTER — Ambulatory Visit
Admission: RE | Admit: 2014-08-21 | Discharge: 2014-08-21 | Disposition: A | Discharge status: Home / Self Care | Payer: Medicare Other | Source: Ambulatory Visit | Attending: Radiation Oncology | Admitting: Radiation Oncology

## 2014-08-21 ENCOUNTER — Ambulatory Visit: Payer: Medicare Other

## 2014-08-21 DIAGNOSIS — C50511 Malignant neoplasm of lower-outer quadrant of right female breast: Secondary | ICD-10-CM | POA: Diagnosis not present

## 2014-08-22 ENCOUNTER — Encounter: Payer: Self-pay | Admitting: Radiation Oncology

## 2014-08-22 ENCOUNTER — Ambulatory Visit
Admission: RE | Admit: 2014-08-22 | Discharge: 2014-08-22 | Disposition: A | Discharge status: Home / Self Care | Payer: Medicare Other | Source: Ambulatory Visit | Attending: Radiation Oncology | Admitting: Radiation Oncology

## 2014-08-22 ENCOUNTER — Telehealth: Payer: Self-pay | Admitting: Adult Health

## 2014-08-22 VITALS — BP 148/65 | HR 88 | Temp 98.0°F | Resp 20 | Ht 65.0 in | Wt 187.1 lb

## 2014-08-22 DIAGNOSIS — C50511 Malignant neoplasm of lower-outer quadrant of right female breast: Secondary | ICD-10-CM | POA: Diagnosis not present

## 2014-08-22 NOTE — Telephone Encounter (Signed)
I spoke with Debbie Joseph and congratulated her on completing her radiation therapy today.  I explained to her the purpose of survivorship and offered her a visit in the Survivorship Clinic.  She is interested and willing to see me, as a coordinated visit with her 51-month follow-up with Dr. Sondra Come in about 1 month.   She will see Dr. Sondra Come on 09/19/14 at 8am and she will see me directly after that visit.   [name] was encouraged her contact me with any questions or concerns. I gave her my direct office number to contact me if needed.  I look forward to participating in her care.   Mike Craze, NP [place] [phone]

## 2014-08-22 NOTE — Progress Notes (Signed)
Debbie Joseph has completed treatment with 33 fractions to her right breast.  She denies pain other than occasional sharp pains in her right breast.  She reports occasional fatigue.  The skin on her right breast is red.  She is using radiaplex gel.  She has been given a one month follow up appointment card.  BP 148/65 mmHg  Pulse 88  Temp(Src) 98 F (36.7 C) (Oral)  Resp 20  Ht 5\' 5"  (1.651 m)  Wt 187 lb 1.6 oz (84.868 kg)  BMI 31.14 kg/m2

## 2014-08-23 ENCOUNTER — Ambulatory Visit: Payer: Medicare Other

## 2014-08-23 NOTE — Progress Notes (Signed)
Weekly Management Note Current Dose:  60.4 Gy  Projected Dose: 60.4 Gy   Narrative:  The patient presents for routine under treatment assessment.  CBCT/MVCT images/Port film x-rays were reviewed.  The chart was checked. Doing well. No complaints.   Physical Findings: Weight: 187 lb 1.6 oz (84.868 kg). Minimal skin redness with no moist desquamation.   Impression:  The patient is tolerating radiation.  Plan:  Continue treatment as planned. Follow up in 1 month.

## 2014-08-28 NOTE — Progress Notes (Signed)
  Radiation Oncology         [phone] ________________________________  Name: Debbie Joseph MRN: [id number]  Date: 08/22/2014  DOB: [birth year]  End of Treatment Note    ICD-9-CM ICD-10-CM   1. Breast cancer of lower-outer quadrant of right female breast 174.5 C50.511     DIAGNOSIS:High-grade intraductal breast cancer presenting in the lower outer quadrant of the right breast      Indication for treatment:  Breast consevation therapy     Radiation treatment dates:  07/05/2014-08/22/2014  Site/dose:   Right breast 50.4 Gy, lumpectomy cavity boost 10 Gy  Beams/energy:   3-D conformal tangent fields initially, boost with 3 field photon boost  Narrative: The patient tolerated radiation treatment relatively well.   Some itching and discomfort in breast, no moist desquamation  Plan: The patient has completed radiation treatment. The patient will return to radiation oncology clinic for routine followup in one month. I advised them to call or return sooner if they have any questions or concerns related to their recovery or treatment.  -----------------------------------  Blair Promise, PhD, MD

## 2014-08-30 NOTE — Progress Notes (Signed)
  Radiation Oncology         [phone] ________________________________  Name: Debbie Joseph MRN: [id number]  Date: 08/01/14  DOB: 23-Apr-1940  SIMULATION NOTE   DIAGNOSIS:  High-grade intraductal breast cancer presenting in the lower outer quadrant of the right breast   NARRATIVE:   the patient underwent add. Planning.  3 field photon boost setup. Computerized isodose plan requested.  PLAN:  The patient will receive 10 Gy in 5 fraction.  ________________________________  -----------------------------------  Blair Promise, PhD, MD

## 2014-09-07 ENCOUNTER — Telehealth: Payer: Self-pay | Admitting: Oncology

## 2014-09-07 ENCOUNTER — Encounter: Payer: Self-pay | Admitting: *Deleted

## 2014-09-07 ENCOUNTER — Ambulatory Visit (HOSPITAL_BASED_OUTPATIENT_CLINIC_OR_DEPARTMENT_OTHER): Payer: Medicare Other | Admitting: Oncology

## 2014-09-07 VITALS — BP 156/69 | HR 73 | Temp 97.5°F | Resp 18 | Ht 65.0 in | Wt 185.8 lb

## 2014-09-07 DIAGNOSIS — Z170 Estrogen receptor positive status [ER+]: Secondary | ICD-10-CM

## 2014-09-07 DIAGNOSIS — C50511 Malignant neoplasm of lower-outer quadrant of right female breast: Secondary | ICD-10-CM

## 2014-09-07 DIAGNOSIS — D0512 Intraductal carcinoma in situ of left breast: Secondary | ICD-10-CM

## 2014-09-07 MED ORDER — TAMOXIFEN CITRATE 20 MG PO TABS: 20.0000 mg | ORAL_TABLET | Freq: Every day | ORAL | Status: AC

## 2014-09-07 NOTE — Progress Notes (Signed)
[name]  [phone] [phone]) [phone]     ID: Debbie Joseph DOB: February 08, 1941  MR#: [id number]  BOF#:[id number]  Patient Care Team: Shirline Frees, MD as PCP - General (Family Medicine) PCP: Shirline Frees, MD GYN: SU: Excell Seltzer  OTHER MD: Gery Pray M.D., Quay Burow M.D.  CHIEF COMPLAINT: Noninvasive breast cancer  CURRENT TREATMENT: tamoxifen   BREAST CANCER HISTORY: From the original intake note:   Debbie Joseph was first diagnosed with breast cancer in 11/19/2000, when she underwent biopsy of a left breast ductal carcinoma in situ under [facility]. She underwent a fuller excision 12/18/2000 and achieve clear margins, although the closest margin remained at 1 mm. She then received radiation therapy and took raloxifene/Evista for approximately 5 years. She discontinued the medication partly because of cost and partly because of concerns regarding arthralgias and myalgias, which however most likely were not related.  She then did fine until 04/17/2014 where bilateral screening mammography with tomography at the breast Center showed new calcifications in the right breast. On 05/01/2014 she underwent right diagnostic mammography which found the breast density to be category C. There were fine pleomorphic calcifications spanning approximately 5 cm in the lower outer quadrant of the right breast. There was no associated mass.  On the same day needle core biopsy was obtained and showed ([id number]) ductal carcinoma in situ, high-grade, estrogen receptor 88% positive, with strong staining intensity; progesterone receptor 60% positive, with moderate staining intensity.  On 05/04/2014 the patient underwent bilateral breast MRI, which showed no findings of interest in the left breast and no abnormal appearing lymph nodes. In the right breast lower outer quadrant there was a 4.57 m area of clumped nodular enhancement. This was felt to correspond closely to the  extent of mammographic calcifications.  Accordingly on 05/08/2014 the patient underwent right lumpectomy with additional right margin resection. The pathology from this procedure ([id number]) showed ductal carcinoma in situ, high-grade, with no invasive component. It measured 3 cm in greatest dimension. There was an additional centimeter of disease associated with the right medial margin portion, but even so margins remained extremely close at less than a millimeter.  Accordingly on 05/15/2014 the patient underwent further surgery for margin clearance. Excision of the right medial and right superior margins found no residual carcinoma.  The patient's subsequent history is as detailed below  INTERVAL HISTORY: Debbie Joseph returns today with her husband Camila Li for follow-up of her noninvasive breast cancer. Since her last visit here she completed her radiation treatments. Generally she did well, with minimal skin changes and mild fatigue. She is now ready to consider anti-estrogens.  REVIEW OF SYSTEMS: She has signed up for the [facility] program and is very excited to participate in that. She and her husband travel to [place] in November of last year and have many and it gets to tell. They also brought me a [place] calling that shows a breast simple. It is very attractive. She still has some pain in the surgical breast, but this is very intermittent. She has problems with arthritis in her hands that come and go and has felt very fatigued at times but again right now she has pretty good energy. A detailed review of systems was otherwise stable  PAST MEDICAL HISTORY: Past Medical History  Diagnosis Date  . Hypertension   . Breast cancer     left breast  . GERD (gastroesophageal reflux disease)   . Dyspnea on exertion   . Family history of adverse reaction  to anesthesia     maternal aunt- slow to wake up   . Headache     hx of migraines greater than 20 years ago   . Breast cancer, right breast      PAST SURGICAL HISTORY: Past Surgical History  Procedure Laterality Date  . Partial hysterectomy  1983  . Umbilical hernia repair  1974  . Breast surgery  2002    lt breast lump-cancer  . Appendectomy  1974    with hernia  . Colonoscopy    . Breast lumpectomy with needle localization Right 05/08/2014    Procedure: BREAST LUMPECTOMY WITH NEEDLE LOCALIZATION;  Surgeon: Excell Seltzer, MD;  Location: [facility];  Service: General;  Laterality: Right;  . Re-excision of breast lumpectomy Right 05/15/2014    Procedure: RE-EXCISION OF BREAST LUMPECTOMY SUPERIOR AND MEDIAL MARGINS;  Surgeon: Excell Seltzer, MD;  Location: [facility];  Service: General;  Laterality: Right;  . Cataract extraction Bilateral 2009  . Dental inplant  2015    FAMILY HISTORY Family History  Problem Relation Age of Onset  . Stroke Mother   . Hypertension Mother   . Lung cancer Father   . Stroke Maternal Grandfather   . Hypertension Maternal Grandfather   . Tuberculosis Paternal Grandmother   . Hypertension Brother   . Diabetes Brother   . Urolithiasis Brother   . Leukemia Sister   . Diabetes Sister   . Hypertension Maternal Aunt   . Hypertension Maternal Uncle   . Colon cancer Maternal Grandmother    the patient's father died from lung cancer at the age of 29, in the setting of tobacco abuse. The patient's mother died at the age of 36. The patient's mother only sister was diagnosed with breast cancer at the age of 49. [name] herself has 3 brothers and 2 sisters. One of her sisters was diagnosed with acute myeloid leukemia at the age of 3. She survives. A maternal grandmother may have had colon or other cancer, diagnosed in her 13s. There is no other history of cancer in the family to the patient's knowledge  GYNECOLOGIC HISTORY:  No LMP recorded. Patient has had a hysterectomy. Menarche age 44 or the patient is GX P0. She took hormone replacement for approximately 15 years, and fertility  treatments again very briefly. 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 She and her husband do quite a bit of [employer] work. 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 There are 2 adopted children are [name] lives in [facility] and is currently looking for a job (his will be teaching at [employer] there), and Purdy lives in [place] and is a definite card specialist. The patient has no grandchildren.    ADVANCED DIRECTIVES: Not in place; at her 06/26/2014 visit the patient was given the appropriate documents 2 complete and not arise at her discretion.  HEALTH MAINTENANCE: History  Substance Use Topics  . Smoking status: Never Smoker   . Smokeless tobacco: Never Used  . Alcohol Use: No     Colonoscopy: 2008?/[facility]  PAP: Status post hysterectomy  Bone density: 2007/normal  Lipid panel:  Allergies  Allergen Reactions  . Evista [Raloxifene] Other (See Comments)    Made joints hurt "really bad"    Current Outpatient Prescriptions  Medication Sig Dispense Refill  . hyaluronate sodium (  RADIAPLEXRX) GEL Apply 1 application topically once.    . hydroxypropyl methylcellulose / hypromellose (ISOPTO TEARS / GONIOVISC) 2.5 % ophthalmic solution Place 1 drop into both eyes daily as needed for dry eyes.     [name] losartan-hydrochlorothiazide (HYZAAR) 50-12.5 MG per tablet Take 1 tablet by mouth daily. (Patient taking differently: Take 1 tablet by mouth daily with breakfast. ) 90 tablet 1  . non-metallic deodorant (ALRA) MISC Apply 1 application topically daily as needed.    [name] omeprazole (PRILOSEC) 20 MG capsule Take 20 mg by mouth daily with breakfast.     . RED YEAST RICE EXTRACT PO Take 1 tablet by mouth daily.      No current facility-administered medications for this visit.    OBJECTIVE: Middle-aged  Joseph woman who appears well Filed Vitals:   09/07/14 1020  BP: 156/69  Pulse: 73  Temp: 97.5 F (36.4 C)  Resp: 18     Body mass index is 30.92 kg/(m^2).    ECOG FS:0 - Asymptomatic  Sclerae unicteric, EOMs intact Oropharynx clear and moist No cervical or supraclavicular adenopathy Lungs no rales or rhonchi Heart regular rate and rhythm Abd soft, nontender, positive bowel sounds MSK no focal spinal tenderness, no upper extremity lymphedema Neuro: nonfocal, well oriented, positive affect Breasts: The right breast is status post lumpectomy and radiation. There is minimal residual erythema. There is no desquamation. There is some distortion of the areola, which is minimal. The right axilla is benign. The left breast is status post remote lumpectomy. Altogether there is slight asymmetry in the breasts, with the right being slightly larger.   LAB RESULTS:  CMP     Component Value Date/Time   NA 141 05/15/2014 0800   K 4.3 05/15/2014 0800   CL 106 05/15/2014 0800   CO2 29 05/15/2014 0800   GLUCOSE 127* 05/15/2014 0800   BUN 22 05/15/2014 0800   CREATININE 0.74 05/15/2014 0800   CREATININE 0.76 02/22/2014 0855   CALCIUM 9.1 05/15/2014 0800   GFRNONAA 83* 05/15/2014 0800   GFRAA >90 05/15/2014 0800    INo results found for: SPEP, UPEP  Lab Results  Component Value Date   WBC 7.7 05/15/2014   HGB 13.7 05/15/2014   HCT 42.4 05/15/2014   MCV 89.6 05/15/2014   PLT 190 05/15/2014      Chemistry      Component Value Date/Time   NA 141 05/15/2014 0800   K 4.3 05/15/2014 0800   CL 106 05/15/2014 0800   CO2 29 05/15/2014 0800   BUN 22 05/15/2014 0800   CREATININE 0.74 05/15/2014 0800   CREATININE 0.76 02/22/2014 0855      Component Value Date/Time   CALCIUM 9.1 05/15/2014 0800       No results found for: LABCA2  No components found for: ZOXWR604  No results for input(s): INR in the last 168 hours.  Urinalysis No results found for: COLORURINE, APPEARANCEUR,  LABSPEC, PHURINE, GLUCOSEU, HGBUR, BILIRUBINUR, KETONESUR, PROTEINUR, UROBILINOGEN, NITRITE, LEUKOCYTESUR  STUDIES: No results found.   ASSESSMENT: 74 y.o. [place] woman  (1) status post left breast biopsy 11/19/2000 for ductal carcinoma in situ, high-grade followed by reexcision for clear margins 12/18/2000  (a) status post adjuvant radiation to the left breast  (b) received raloxifene for approximately 5 years  (2) status post right breast lower outer quadrant core biopsy 05/01/2014 for ductal carcinoma in situ, high-grade, estrogen receptor 88% positive, progesterone receptor 60% positive  (3)  right breast lumpectomy with additional right medial  margin surgery 05/08/2014 showed a 3 cm ductal carcinoma in situ, high-grade, less than 1 mm from the final medial margin  (4) right breast reexcision 05/15/2014 showed no residual ductal carcinoma in situ  (5) adjuvant radiation 07/05/2014-08/22/2014: Right breast 50.4 Gy, lumpectomy cavity boost 10 Gy  (6) tamoxifen started May 2016  PLAN: We reviewed the fact that her breast cancer was not invasive, and therefore not life threatening. By receiving radiation she has significantly lower the risk of local recurrence. Accordingly anti-estrogens, while they would further lower the risk of local recurrence, have a very minimal role in the setting of treatments. I would calculate a 5% decrease in the risk of recurrence locally.  She may receive greater benefit prophylactically if she takes anti-estrogens for 5 years. This would cut in half her risk of developing another breast cancer in either breast. This could be invasive or noninvasive. Since she has had breast cancer twice a ready, this is more compelling to her.  We then reviewed the side effects toxicities and complete occasions of tamoxifen as compared to aromatase inhibitor. Since she has a ready had bilateral cataract surgery and is status post hysterectomy, I think tamoxifen would  be a particularly good choice for her. We went ahead and placed the order and she will call if she has any side effects from it of any consequence.  Otherwise I will see her again late January, after her next mammogram. If all is well at that time I will start seeing her on a once a year basis until she completes her 5 years of follow-up. Chauncey Cruel, MD   09/07/2014 10:26 AM Medical Oncology and Hematology [facility] [address] [place], [place] [address] Tel. [phone]    Fax. [phone]

## 2014-09-07 NOTE — Telephone Encounter (Signed)
Left message to confirm appointment for January. Mailed calendar.

## 2014-09-07 NOTE — Progress Notes (Signed)
Met with pt during f/u with Dr. Jana Hakim after completion of xrt. Relate she is doing well. Feels a little fatigued. She has signed up for the [employer] program. Denies needs at this time. Encourage pt to call with questions or concerns. Received verbal understanding.

## 2014-09-15 ENCOUNTER — Encounter: Payer: Self-pay | Admitting: Oncology

## 2014-09-19 ENCOUNTER — Encounter: Payer: Self-pay | Admitting: Radiation Oncology

## 2014-09-19 ENCOUNTER — Ambulatory Visit
Admission: RE | Admit: 2014-09-19 | Discharge: 2014-09-19 | Disposition: A | Discharge status: Home / Self Care | Payer: Medicare Other | Source: Ambulatory Visit | Attending: Radiation Oncology | Admitting: Radiation Oncology

## 2014-09-19 ENCOUNTER — Ambulatory Visit (HOSPITAL_BASED_OUTPATIENT_CLINIC_OR_DEPARTMENT_OTHER): Payer: Medicare Other | Admitting: Adult Health

## 2014-09-19 VITALS — BP 152/75 | HR 87 | Temp 98.0°F | Resp 16 | Ht 65.0 in | Wt 184.7 lb

## 2014-09-19 DIAGNOSIS — D0511 Intraductal carcinoma in situ of right breast: Secondary | ICD-10-CM

## 2014-09-19 DIAGNOSIS — R5383 Other fatigue: Secondary | ICD-10-CM | POA: Diagnosis not present

## 2014-09-19 DIAGNOSIS — N951 Menopausal and female climacteric states: Secondary | ICD-10-CM

## 2014-09-19 DIAGNOSIS — Z853 Personal history of malignant neoplasm of breast: Secondary | ICD-10-CM | POA: Diagnosis not present

## 2014-09-19 DIAGNOSIS — C50511 Malignant neoplasm of lower-outer quadrant of right female breast: Secondary | ICD-10-CM

## 2014-09-19 NOTE — Progress Notes (Signed)
  Radiation Oncology         [phone] ________________________________  Name: Debbie [place] MRN: [id number]  Date: 09/19/2014  DOB: 07-May-1940  Follow-Up Visit Note  CC: Shirline Frees, MD  Excell Seltzer, MD    ICD-9-CM ICD-10-CM   1. Breast cancer of lower-outer quadrant of right female breast 174.5 C50.511     Diagnosis:  High-grade intraductal breast cancer presenting in the lower outer quadrant of the right breast    Interval Since Last Radiation:  27  days  Narrative:  The patient returns today for routine follow-up.     She reports occasional sharp pains in her right breast. She reports her energy level is improving. She is taking tamoxifen. She is having hot flashes at 4/ day but says she had them before taking tamoxifen. She continues to use radiaplex.                           ALLERGIES:  is allergic to evista.  Meds: Current Outpatient Prescriptions  Medication Sig Dispense Refill  . hydroxypropyl methylcellulose / hypromellose (ISOPTO TEARS / GONIOVISC) 2.5 % ophthalmic solution Place 1 drop into both eyes daily as needed for dry eyes.     [name] losartan-hydrochlorothiazide (HYZAAR) 50-12.5 MG per tablet Take 1 tablet by mouth daily. (Patient taking differently: Take 1 tablet by mouth daily with breakfast. ) 90 tablet 1  . omeprazole (PRILOSEC) 20 MG capsule Take 20 mg by mouth daily with breakfast.     . RED YEAST RICE EXTRACT PO Take 1 tablet by mouth daily.     . tamoxifen (NOLVADEX) 20 MG tablet Take 1 tablet (20 mg total) by mouth daily. 90 tablet 12   No current facility-administered medications for this encounter.    Physical Findings: The patient is in no acute distress. Patient is alert and oriented.  height is 5\' 5"  (1.651 m) and weight is 184 lb 11.2 oz (83.779 kg). Her oral temperature is 98 F (36.7 C). Her blood pressure is 152/75 and her pulse is 87. Her respiration is 16. [name]  No palpable supraclavicular or axillary adenopathy. Lungs are  clear to auscultation. The heart has a regular rhythm and rate. Examination of the left breast reveals no palpable mass or nipple discharge. The patient has a faint scar in the upper aspect of the breast from her prior lumpectomy. Examination of the right breast reveals some mild edema in the nipple areolar complex area. The patient's skin is well healed at this time. No dominant masses appreciated within the breast nipple discharge or bleeding.  Lab Findings: Lab Results  Component Value Date   WBC 7.7 05/15/2014   HGB 13.7 05/15/2014   HCT 42.4 05/15/2014   MCV 89.6 05/15/2014   PLT 190 05/15/2014    Radiographic Findings: No results found.  Impression:  The patient is recovering from the effects of radiation.  No evidence of recurrence on clinical exam today  Plan:  Routine follow-up in 6 months. I recommended she speak with medical oncology concerning her questions about vitamins and interactions with tamoxifen.  ____________________________________ Blair Promise, MD

## 2014-09-19 NOTE — Progress Notes (Signed)
CLINIC:  Cancer Survivorship   REASON FOR VISIT:  Routine follow-up post-treatment for a recent history of breast cancer.  BRIEF ONCOLOGIC HISTORY:    Breast cancer of lower-outer quadrant of right female breast   05/01/2014 Mammogram Breast breast, LOQ; pleomorphic calcs spanning at least 5 cm. No assicated mass identified.    05/01/2014 Initial Diagnosis Breast cancer of lower-outer quadrant of right female breast   05/01/2014 Initial Biopsy Right breast needle core biopsy: Grade 2-3 DCIS with calcs.  ER+ (88%), PR+ (60%).    05/04/2014 Breast MRI Right breast (LOQ): associated segmental clumped nodular enhancement measuring 4.5 x 2.1 x 1.8 cm. No mass or abnormal enhancement in left breast or in lymp nodes. h/o left lumpectomy, but no evidence for contralateral malignancy in left breast.    05/08/2014 Definitive Surgery Right lumpectomy ([facility]): Grade 3, DCIS with comedonecrosis & associated calcs; total 4 cm of disease. Positive margins. ER+ (88), PR+ (60%).    05/15/2014 Surgery Right breasrt excision. Focal ADH, otherwise no atypia or malignancy.    07/05/2014 - 08/22/2014 Radiation Therapy Adjuvant RT completed ([facility]). Right breast: Total dose 50.4 Gy over 28 fractions. Right breast boost: 10 Gy over 5 fractions.    08/2014 -  Anti-estrogen oral therapy Tamoxifen daily. Planned duration of treatment: 5 years Therapist, nutritional)    INTERVAL HISTORY:  Debbie [place] presents to the [facility] today for our initial meeting to review her survivorship care plan detailing her treatment course for breast cancer, as well as monitoring long-term side effects of that treatment, education regarding health maintenance, screening, and overall wellness and health promotion.     Overall, [name] reports feeling quite well since completing her radiation therapy approximately one month ago.  She endorses some continued fatigue since finishing radiation.  She started her anti-estrogen therapy with Tamoxifen  about 2 weeks ago.  She has noticed some increased hot flashes, particularly at night.  She says that she has had chronic issues with hot flashes, before she ever started the tamoxifen.  She has wonderful support in her family and friends.  She and her husband travel internationally very often and are looking forward to their next missions trip in [place] in August.  REVIEW OF SYSTEMS:  General: Denies fever, chills, unintentional weight loss.  Endorses fatigue as above.   Cardiac: Denies palpitations, chest pain, and lower extremity edema.  Respiratory: Denies cough, shortness of breath, and dyspnea on exertion.  GI: Denies abdominal pain, constipation, diarrhea, nausea, or vomiting.  GU: Denies dysuria, hematuria, vaginal bleeding, vaginal discharge. Musculoskeletal: Endorses occasional arthritis pain, particularly in her hands.  Neuro: Denies headache or recent falls. Denies peripheral neuropathy. Skin: Denies rash, pruritis, or open wounds.  Breast: Denies any new nodularity, masses, tenderness, nipple changes, or nipple discharge.  Psych: Denies depression, anxiety.   A 14-point review of systems was completed and was negative, except as noted above.   ONCOLOGY TREATMENT TEAM:  1. Surgeon:  Dr. Saddie Benders at [facility] 2. Medical Oncologist: Dr. Jana Hakim 3. Radiation Oncologist: Dr. Sondra Come    PAST MEDICAL/SURGICAL HISTORY:  Past Medical History  Diagnosis Date  . Hypertension   . Breast cancer     left breast  . GERD (gastroesophageal reflux disease)   . Dyspnea on exertion   . Family history of adverse reaction to anesthesia     maternal aunt- slow to wake up   . Headache     hx of migraines greater than 20 years ago   .  Breast cancer, right breast   . Radiation 07/04/14-08/22/14    right breast 50.4 Gy, lumpectomy cavity boosted to 10 Gy   Past Surgical History  Procedure Laterality Date  . Partial hysterectomy  1983  . Umbilical hernia repair  1974  . Breast  surgery  2002    lt breast lump-cancer  . Appendectomy  1974    with hernia  . Colonoscopy    . Breast lumpectomy with needle localization Right 05/08/2014    Procedure: BREAST LUMPECTOMY WITH NEEDLE LOCALIZATION;  Surgeon: Excell Seltzer, MD;  Location: [facility];  Service: General;  Laterality: Right;  . Re-excision of breast lumpectomy Right 05/15/2014    Procedure: RE-EXCISION OF BREAST LUMPECTOMY SUPERIOR AND MEDIAL MARGINS;  Surgeon: Excell Seltzer, MD;  Location: [facility];  Service: General;  Laterality: Right;  . Cataract extraction Bilateral 2009  . Dental inplant  2015     ALLERGIES:  Allergies  Allergen Reactions  . Evista [Raloxifene] Other (See Comments)    Made joints hurt "really bad"     CURRENT MEDICATIONS:  Current Outpatient Prescriptions on File Prior to Visit  Medication Sig Dispense Refill  . hydroxypropyl methylcellulose / hypromellose (ISOPTO TEARS / GONIOVISC) 2.5 % ophthalmic solution Place 1 drop into both eyes daily as needed for dry eyes.     [name] losartan-hydrochlorothiazide (HYZAAR) 50-12.5 MG per tablet Take 1 tablet by mouth daily. (Patient taking differently: Take 1 tablet by mouth daily with breakfast. ) 90 tablet 1  . omeprazole (PRILOSEC) 20 MG capsule Take 20 mg by mouth daily with breakfast.     . RED YEAST RICE EXTRACT PO Take 1 tablet by mouth daily.     . tamoxifen (NOLVADEX) 20 MG tablet Take 1 tablet (20 mg total) by mouth daily. 90 tablet 12   No current facility-administered medications on file prior to visit.     ONCOLOGIC FAMILY HISTORY:  Family History  Problem Relation Age of Onset  . Stroke Mother   . Hypertension Mother   . Lung cancer Father   . Stroke Maternal Grandfather   . Hypertension Maternal Grandfather   . Tuberculosis Paternal Grandmother   . Hypertension Brother   . Diabetes Brother   . Urolithiasis Brother   . Leukemia Sister   . Diabetes Sister   . Hypertension Maternal Aunt   .  Hypertension Maternal Uncle   . Colon cancer Maternal Grandmother      GENETIC COUNSELING/TESTING: None  SOCIAL HISTORY:  Debbie [place] is married and lives with her husband in [place].  They have 2 children who are adopted, 1 daughter and 1 son.   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  She and her husband now travel internationally very often for faith-based missions work, which they really enjoy.  She denies any current or history of tobacco, alcohol, or illicit drug use.     PHYSICAL EXAMINATION:  General: Well-nourished, well-appearing female in no acute distress.  She is by her husband in clinic today.   HEENT: Head is atraumatic and normocephalic.  Pupils equal and reactive to light and accomodation. Conjunctivae clear without exudate.  Sclerae anicteric. Oral mucosa is pink, moist, and intact without lesions.  Oropharynx is pink without lesions or erythema.  Lymph: No cervical, supraclavicular, infraclavicular, or axillary lymphadenopathy noted on palpation.  Cardiovascular: Regular rate and rhythm without murmurs, rubs, or gallops. Respiratory: Clear to auscultation bilaterally. Chest expansion symmetric without accessory muscle use on  inspiration or expiration.  GI: Abdomen soft and round. No tenderness to palpation. Bowel sounds normoactive in 4 quadrants. No hepatosplenomegaly.   GU: Deferred.  Musculoskeletal: Muscle strength 5/5 in all extremities.  Full ROM noted in all extremities.  Neuro: No focal deficits. Steady gait.  Psych: Mood and affect normal and appropriate for situation.  Extremities: No edema, cyanosis, or clubbing.  Skin: Warm and dry. No open lesions noted.   LABORATORY DATA:  None for this visit.  DIAGNOSTIC IMAGING:  None for this visit.     ASSESSMENT AND PLAN:   1. History of breast cancer:  Ms. Palmatier will follow-up with her medical oncologist, Dr. Jana Hakim in 04/2015 with history and physical exam per  surveillance protocol.  She will continue her anti-estrogen therapy with Tamoxifen as prescribed by Dr. Jana Hakim. She was instructed to make Dr. Jana Hakim or myself aware if she begins to experience any additional side effects of the medication and I could see her back in clinic to help manage those side effects, as needed. Common side effects of Tamoxifen were again reviewed with her as well. A comprehensive survivorship care plan and treatment summary was reviewed with the patient today detailing her breast cancer diagnosis, treatment course, potential late/long-term effects of treatment, appropriate follow-up care with recommendations for the future, and patient education resources.  A copy of this summary, along with a letter will be sent to the patient's primary care provider via mail/fax/In Basket message after today's visit.  Ms. Wahlstrom is welcome to return to the Survivorship Clinic in the future, as needed; no follow-up will be scheduled at this time.    2. Fatigue: Fatigue is a very common complaint among cancer survivors.  In Ms. Withey's case, the cause is likely multifactorial.  The most likely reasons include recent completion of radiation therapy and other anti-cancer treatments and recent initiation of Tamoxifen which may be affecting her ability to sleep well due to hot flashes.  We discussed that physical activity is one of the best ways to help combat fatigue.  Ms. Keller is already registered for the [facility] program and is looking forward to getting started with them soon.  I did educate her that if her fatigue does not improve in the coming weeks with increased physical activity, that she should contact her PCP, as there could be a medical reason for her fatigue, such as hypothyroidism, anemia, etc.  She verbalized understanding and agrees with this plan.   3. Hot flashes: Hot flashes have been a chronic complaint for Ms. Campillo, and this is likely due to estrogen deprivation as a result  of menopause.  She has also recently started her anti-estrogen therapy with Tamoxifen, which could certainly be exacerbating her chronic hot flashes.  We discussed several pharmacologic and non-pharmacologic/behavioral interventions to help manage the hot flashes.  Since her hot flashes are worse at night, we discussed starting gabapentin, as this medication has been shown to decrease both the frequency and severity of hot flashes in women.  However, Ms. Altland does not want to start any new medications at this time.  We discussed behavioral changes she could try, if she is able to identify common triggers for hot flashes, like hot/spicy foods or drinks, caffeine, warm environments, etc.  She prefers to "wait and see" how her body continues to become acclimated to the Tamoxifen over the next several weeks.  I encouraged her to call me and let me know if she would like for me to prescribe  her the gabapentin and I would be happy to do so.  She agrees with this plan.   4. Bone health:  Given Ms. Tinnon's age, history of breast cancer, and her current treatment regimen including anti-estrogen, she is at risk for bone demineralization.  While Tamoxifen can often offer some bone strengthening properties, it will still be important for Ms. Leeth to increase her consumption of foods rich in calcium, as well as increase her weight-bearing activities.  She was given education on specific activities to promote bone health.  Her last DEXA scan was in 02/2007 and was normal.  She would be eligible for follow-up DEXA scan as clinically indicated by Dr. Jana Hakim or her PCP.   5. Cancer screening:  Due to Ms. Shouse's history and her age, she should receive screening for skin cancers, colon cancer, and gynecologic cancers.  The information and recommendations are listed on the patient's comprehensive care plan/treatment summary and were reviewed in detail with the patient.    6. Health maintenance and wellness promotion: Ms.  Viernes was encouraged to consume 5-7 servings of fruits and vegetables per day. She was also encouraged to engage in moderate to vigorous exercise for 30 minutes per day most days of the week. We discussed the [employer] fitness program, which is designed for cancer survivors to help them become more physically fit after cancer treatments.  She is already registered for [employer] and will begin the program soon.  She was instructed to limit her alcohol consumption and continue to abstain from tobacco use.   7. Support services/counseling: It is not uncommon for this period of the patient's cancer care trajectory to be one of many emotions and stressors.  We discussed an opportunity for her to participate in the next session of [facility] ("Finding Your New Normal") support group series designed for patients after they have completed treatment. She is going to consider attending [facility] and has the registration information if she chooses to participate in the future.  Ms. Carvey was encouraged to take advantage of our many other support services programs, support groups, and/or counseling in coping with her new life as a cancer survivor after completing anti-cancer treatment.  She was offered support today through active listening and expressive supportive counseling.  She was given information regarding our available services and encouraged to contact me with any questions or for help enrolling in any of our support group/programs.    A total of 50 minutes of face-to-face time was spent with this patient with greater than 50% of that time in counseling and care-coordination.   Mike Craze, NP Survivorship Program [place] [phone]   Note: PRIMARY CARE PROVIDER Shirline Frees, [place] [phone]

## 2014-09-19 NOTE — Progress Notes (Signed)
Debbie [place] here for follow up.  She reports occasional sharp pains in her right breast.  She reports her energy level is improving.  She is taking tamoxifen.  She is having hot flashes at 4 am but says she had them before taking tamoxifen.  She is wondering about vitamin interactions with the tamoxifen.  The skin on her right breast is intact.  She has hyperpigmentation under her right arm.  She continues to use radiaplex.  BP 152/75 mmHg  Pulse 87  Temp(Src) 98 F (36.7 C) (Oral)  Resp 16  Ht 5\' 5"  (1.651 m)  Wt 184 lb 11.2 oz (83.779 kg)  BMI 30.74 kg/m2

## 2014-09-20 ENCOUNTER — Ambulatory Visit: Payer: Self-pay | Admitting: Radiation Oncology

## 2014-09-21 ENCOUNTER — Encounter: Payer: Self-pay | Admitting: Adult Health

## 2014-10-09 ENCOUNTER — Other Ambulatory Visit: Payer: Self-pay | Admitting: Cardiovascular Disease

## 2014-10-09 NOTE — Telephone Encounter (Signed)
Rx(s) sent to pharmacy electronically. OV 10/31/14 with Dr. Gwenlyn Found

## 2014-10-12 DIAGNOSIS — H04123 Dry eye syndrome of bilateral lacrimal glands: Secondary | ICD-10-CM | POA: Diagnosis not present

## 2014-10-16 ENCOUNTER — Other Ambulatory Visit: Payer: Self-pay

## 2014-10-31 ENCOUNTER — Encounter: Payer: Self-pay | Admitting: Cardiovascular Disease

## 2014-10-31 ENCOUNTER — Ambulatory Visit (INDEPENDENT_AMBULATORY_CARE_PROVIDER_SITE_OTHER): Payer: Medicare Other | Admitting: Cardiovascular Disease

## 2014-10-31 VITALS — BP 122/70 | HR 76 | Ht 65.0 in | Wt 183.0 lb

## 2014-10-31 DIAGNOSIS — Z13220 Encounter for screening for lipoid disorders: Secondary | ICD-10-CM

## 2014-10-31 DIAGNOSIS — Z79899 Other long term (current) drug therapy: Secondary | ICD-10-CM

## 2014-10-31 DIAGNOSIS — I10 Essential (primary) hypertension: Secondary | ICD-10-CM | POA: Diagnosis not present

## 2014-10-31 NOTE — Progress Notes (Signed)
10/31/2014 Doloris Hall   [birth year]  [id number]  Primary Physician Shirline Frees, MD Primary Cardiologist: Lorretta Harp MD Debbie [place]   HPI:  Ms. Fredman is a 74 year old moderately overweight married Caucasian female mother of 2 children who it is accompanied by her husband Camila Li. She is referred by Dr. Kenton Kingfisher for cardiovascular evaluation because of poorly controlled blood pressure and increasing dyspnea on exertion. She has a history of blood pressure for years. She does not watch her salt intake. There are no other cardiovascular risk factors. She does not have a family history of heart disease. She has never had a heart attack or stroke. She denies chest pain but does relate a history of increasing dyspnea on exertion over the last 12 months. When Dr. Kenton Kingfisher double-decker losartan from 50-100 mg her blood pressure was better controlled although she was symptomatic from this. I saw her back 12/08/13 I performed a 2-D echo that was entirely normal and renal Doppler studies that ruled out renal vascular disease. Since I saw her back in October she has developed recurrent breast cancer and has had surgery and radiation therapy. She also joined the [facility] and has been exercising fairly regularly and watching her diet. She feels clinically improved since I saw her last.   Current Outpatient Prescriptions  Medication Sig Dispense Refill  . hydroxypropyl methylcellulose / hypromellose (ISOPTO TEARS / GONIOVISC) 2.5 % ophthalmic solution Place 1 drop into both eyes daily as needed for dry eyes.     [name] losartan-hydrochlorothiazide (HYZAAR) 50-12.5 MG per tablet TAKE ONE TABLET BY MOUTH ONCE DAILY 90 tablet 0  . losartan-hydrochlorothiazide (HYZAAR) 50-12.5 MG per tablet TAKE ONE TABLET BY MOUTH ONCE DAILY 90 tablet 3  . omeprazole (PRILOSEC) 20 MG capsule Take 20 mg by mouth daily with breakfast.     . RED YEAST RICE EXTRACT PO Take 1 tablet by mouth daily.     . tamoxifen (NOLVADEX)  20 MG tablet Take 20 mg by mouth daily.     No current facility-administered medications for this visit.    Allergies  Allergen Reactions  . Evista [Raloxifene] Other (See Comments)    Made joints hurt "really bad"    History   Social History  . Marital Status: Married    Spouse Name: N/A  . Number of Children: 2  . Years of Education: N/A   Occupational History  . teacher    Social History Main Topics  . Smoking status: Never Smoker   . Smokeless tobacco: Never Used  . Alcohol Use: No  . Drug Use: No  . Sexual Activity: Not on file   Other Topics Concern  . Not on file   Social History Narrative     Review of Systems: General: negative for chills, fever, night sweats or weight changes.  Cardiovascular: negative for chest pain, dyspnea on exertion, edema, orthopnea, palpitations, paroxysmal nocturnal dyspnea or shortness of breath Dermatological: negative for rash Respiratory: negative for cough or wheezing Urologic: negative for hematuria Abdominal: negative for nausea, vomiting, diarrhea, bright red blood per rectum, melena, or hematemesis Neurologic: negative for visual changes, syncope, or dizziness All other systems reviewed and are otherwise negative except as noted above.    Blood pressure 122/70, pulse 76, height 5\' 5"  (1.651 m), weight 183 lb (83.008 kg).  General appearance: alert and no distress Neck: no adenopathy, no carotid bruit, no JVD, supple, symmetrical, trachea midline and thyroid not enlarged, symmetric, no tenderness/mass/nodules Lungs: clear to auscultation bilaterally  Heart: regular rate and rhythm, S1, S2 normal, no murmur, click, rub or gallop Extremities: extremities normal, atraumatic, no cyanosis or edema  EKG normal sinus rhythm at 76 without ST or T-wave changes. I personally reviewed this EKG  ASSESSMENT AND PLAN:   Essential hypertension History of hypertension with blood pressure measured today 122/70.[name] She is on losartan  and hydrochlorothiazide. Continue current meds at current dosing. [facility] has had [facility] that her blood pressure has been much more easily controlled      Lorretta Harp MD [facility], [facility] 10/31/2014 9:12 AM

## 2014-10-31 NOTE — Patient Instructions (Signed)
Your physician recommends that you return for lab work at your earliest convenience - FASTING.  Dr Berry recommends that you schedule a follow-up appointment in 1 year. You will receive a reminder letter in the mail two months in advance. If you don't receive a letter, please call our office to schedule the follow-up appointment. 

## 2014-10-31 NOTE — Assessment & Plan Note (Signed)
History of hypertension with blood pressure measured today 122/70.[name] She is on losartan and hydrochlorothiazide. Continue current meds at current dosing. She has had said that [facility] and [facility] intake in diet that her blood pressure has been much more easily controlled

## 2014-11-02 DIAGNOSIS — Z79899 Other long term (current) drug therapy: Secondary | ICD-10-CM | POA: Diagnosis not present

## 2014-11-02 DIAGNOSIS — Z13220 Encounter for screening for lipoid disorders: Secondary | ICD-10-CM | POA: Diagnosis not present

## 2014-11-02 LAB — HEPATIC FUNCTION PANEL
ALBUMIN: 4 g/dL (ref 3.5–5.2)
ALT: 13 U/L (ref 0–35)
AST: 15 U/L (ref 0–37)
Alkaline Phosphatase: 52 U/L (ref 39–117)
BILIRUBIN DIRECT: 0.1 mg/dL (ref 0.0–0.3)
Indirect Bilirubin: 0.2 mg/dL (ref 0.2–1.2)
Total Bilirubin: 0.3 mg/dL (ref 0.2–1.2)
Total Protein: 6.5 g/dL (ref 6.0–8.3)

## 2014-11-02 LAB — LIPID PANEL
CHOLESTEROL: 148 mg/dL (ref 0–200)
HDL: 35 mg/dL — ABNORMAL LOW (ref >=46)
LDL Cholesterol: 78 mg/dL (ref 0–99)
Total CHOL/HDL Ratio: 4.2 Ratio
Triglycerides: 176 mg/dL — ABNORMAL HIGH (ref <150)
VLDL: 35 mg/dL (ref 0–40)

## 2014-11-27 ENCOUNTER — Telehealth: Payer: Self-pay | Admitting: Adult Health

## 2014-11-27 NOTE — Telephone Encounter (Signed)
I received a voicemail from Ms. Helinski with questions regarding genetic testing. I left her a voicemail and encouraged her to return my call when she was able.    Mike Craze, NP [place] [phone]

## 2014-11-28 ENCOUNTER — Telehealth: Payer: Self-pay | Admitting: Adult Health

## 2014-11-28 ENCOUNTER — Other Ambulatory Visit: Payer: Self-pay | Admitting: Adult Health

## 2014-11-28 ENCOUNTER — Telehealth: Payer: Self-pay | Admitting: *Deleted

## 2014-11-28 DIAGNOSIS — C50511 Malignant neoplasm of lower-outer quadrant of right female breast: Secondary | ICD-10-CM

## 2014-11-28 NOTE — Telephone Encounter (Signed)
Ms. Brandhorst returned my call and let me know that she would like to pursue genetic testing, if she is eligible.  I told her that I would place the order for genetics and they would review her record to determine her eligibility. I also emailed our [employer] directly with the patient's request.    Also, Ms. Bilski was concerned about taking a multivitamin that contains Niacin and if it has any interactions with the Tamoxifen.  She was also concerned about their being a potential interaction with Red Yeast Rice.  I let her know that I thought it would be safe for these to be taken together, but I would verify with our Pharmacy team to be sure.   I spoke with one of our PharmD's and they verified through [facility] that Tamoxifen with both Niacin and Red Yeast Rice have no interactions.  I called Ms. Channell and let her know that I confirmed that they were safe together.  I let her know that someone from Genetics would be in touch with her.   I encouraged her to call me with any other concerns or issues. She has my direct office number to call me if needed.  Mike Craze, NP [place] [phone]

## 2014-11-28 NOTE — Telephone Encounter (Signed)
  Oncology Nurse Navigator Documentation    Navigator Encounter Type: Telephone;3 month (11/28/14 1000)     Barriers/Navigation Needs: No barriers at this time (11/28/14 1000)

## 2014-11-29 ENCOUNTER — Telehealth: Payer: Self-pay | Admitting: Adult Health

## 2014-11-29 NOTE — Telephone Encounter (Signed)
I spoke with Ms. Bolotin regarding her request to learn more about genetic counseling.  I let the patient know that I spoke with Roma Kayser and Ms. Dutson may not be eligible for actual testing, but a formal evaluation with an in-person visit could be done to discuss the risks/benefits of genetic testing with our genetic counselors.  Ms. Brugger stated that she is interested in at least learning more, in the hopes that it may benefit her siblings and their children.  She is now scheduled for genetic counseling on 12/13/14 at [facility].  I let Ms. Montfort know the time and location of this appointment and encouraged her to call me with any questions.   Mike Craze, NP [place] [phone]

## 2014-12-13 ENCOUNTER — Ambulatory Visit: Payer: Medicare Other | Admitting: Radiation Oncology

## 2014-12-13 ENCOUNTER — Encounter: Payer: Self-pay | Admitting: Genetic Counselor

## 2014-12-13 ENCOUNTER — Other Ambulatory Visit: Payer: Medicare Other

## 2014-12-13 ENCOUNTER — Ambulatory Visit (HOSPITAL_BASED_OUTPATIENT_CLINIC_OR_DEPARTMENT_OTHER): Payer: Medicare Other | Admitting: Genetic Counselor

## 2014-12-13 DIAGNOSIS — Z803 Family history of malignant neoplasm of breast: Secondary | ICD-10-CM | POA: Diagnosis not present

## 2014-12-13 DIAGNOSIS — Z800 Family history of malignant neoplasm of digestive organs: Secondary | ICD-10-CM | POA: Diagnosis not present

## 2014-12-13 DIAGNOSIS — Z801 Family history of malignant neoplasm of trachea, bronchus and lung: Secondary | ICD-10-CM

## 2014-12-13 DIAGNOSIS — Z315 Encounter for genetic counseling: Secondary | ICD-10-CM | POA: Diagnosis not present

## 2014-12-13 DIAGNOSIS — C50511 Malignant neoplasm of lower-outer quadrant of right female breast: Secondary | ICD-10-CM

## 2014-12-13 DIAGNOSIS — D0512 Intraductal carcinoma in situ of left breast: Secondary | ICD-10-CM | POA: Diagnosis present

## 2014-12-13 NOTE — Progress Notes (Signed)
REFERRING PROVIDER: Shirline Frees, MD [place],  [id number]   Lurline Del, MD  PRIMARY PROVIDER:  Shirline Frees, MD  PRIMARY REASON FOR VISIT:  1. Breast cancer of lower-outer quadrant of right female breast   2. Family history of breast cancer      HISTORY OF PRESENT ILLNESS:   Ms. Monrroy, a 74 y.o. female, was seen for a  cancer genetics consultation at the request of Dr. Kenton Kingfisher due to a personal and family history of cancer.  Ms. Idris presents to clinic today to discuss the possibility of a hereditary predisposition to cancer, genetic testing, and to further clarify her future cancer risks, as well as potential cancer risks for family members.   In 2002, at the age of 49, Ms. Skilling was diagnosed with DCIS of the left breast. The tumor was ER+/PR+. This was treated with lumpectomy and radiation.  In 2016, at the age of 77, Ms. Staebell was diagnosed with DCIS breast cancer of the right breast.  The tumor was ER+/PR+.  This also was treated with lumpectomy and radiation.     CANCER HISTORY:    Breast cancer of lower-outer quadrant of right female breast   05/01/2014 Mammogram Breast breast, LOQ; pleomorphic calcs spanning at least 5 cm. No assicated mass identified.    05/01/2014 Initial Diagnosis Breast cancer of lower-outer quadrant of right female breast   05/01/2014 Initial Biopsy Right breast needle core biopsy: Grade 2-3 DCIS with calcs.  ER+ (88%), PR+ (60%).    05/04/2014 Breast MRI Right breast (LOQ): associated segmental clumped nodular enhancement measuring 4.5 x 2.1 x 1.8 cm. No mass or abnormal enhancement in left breast or in lymp nodes. h/o left lumpectomy, but no evidence for contralateral malignancy in left breast.    05/08/2014 Definitive Surgery Right lumpectomy ([facility]): Grade 3, DCIS with comedonecrosis & associated calcs; total 4 cm of disease. Positive margins. ER+ (88), PR+ (60%).    05/15/2014 Surgery Right breasrt  excision. Focal ADH, otherwise no atypia or malignancy.    07/05/2014 - 08/22/2014 Radiation Therapy Adjuvant RT completed ([facility]). Right breast: Total dose 50.4 Gy over 28 fractions. Right breast boost: 10 Gy over 5 fractions.    08/2014 -  Anti-estrogen oral therapy Tamoxifen daily. Planned duration of treatment: 5 years (Magrinat)   09/19/2014 Survivorship Survivorship Care Plan given to patient and reviewed with her in person.      HORMONAL RISK FACTORS:  Menarche was at age 34.  First live birth at age N/A - patient has two adopted children.  Ovaries intact: yes.  Hysterectomy: yes.  Menopausal status: postmenopausal.  HRT use: 15 years. Colonoscopy: yes; normal. Mammogram within the last year: yes. Number of breast biopsies: 2. Up to date with pelvic exams:  no. Any excessive radiation exposure in the past:  For breast cancer treatment  Past Medical History  Diagnosis Date  . Hypertension   . Breast cancer     left breast  . GERD (gastroesophageal reflux disease)   . Dyspnea on exertion   . Family history of adverse reaction to anesthesia     maternal aunt- slow to wake up   . Headache     hx of migraines greater than 20 years ago   . Breast cancer, right breast   . Radiation 07/04/14-08/22/14    right breast 50.4 Gy, lumpectomy cavity boosted to 10 Gy  . Family history of breast cancer     Past Surgical History  Procedure Laterality Date  . Partial hysterectomy  1983  . Umbilical hernia repair  1974  . Breast surgery  2002    lt breast lump-cancer  . Appendectomy  1974    with hernia  . Colonoscopy    . Breast lumpectomy with needle localization Right 05/08/2014    Procedure: BREAST LUMPECTOMY WITH NEEDLE LOCALIZATION;  Surgeon: Excell Seltzer, MD;  Location: [facility];  Service: General;  Laterality: Right;  . Re-excision of breast lumpectomy Right 05/15/2014    Procedure: RE-EXCISION OF BREAST LUMPECTOMY SUPERIOR AND MEDIAL MARGINS;  Surgeon:  Excell Seltzer, MD;  Location: [facility];  Service: General;  Laterality: Right;  . Cataract extraction Bilateral 2009  . Dental inplant  2015    Social History   Social History  . Marital Status: Married    Spouse Name: N/A  . Number of Children: 2  . Years of Education: N/A   Occupational History  . teacher    Social History Main Topics  . Smoking status: Never Smoker   . Smokeless tobacco: Never Used  . Alcohol Use: No  . Drug Use: No  . Sexual Activity: Not Asked   Other Topics Concern  . None   Social History Narrative     FAMILY HISTORY:  We obtained a detailed, 4-generation family history.  Significant diagnoses are listed below: Family History  Problem Relation Age of Onset  . Stroke Mother 5  . Hypertension Mother   . Lung cancer Father 60  . Stroke Maternal Grandfather 60  . Hypertension Maternal Grandfather   . Tuberculosis Paternal Grandmother   . Hypertension Brother   . Diabetes Brother   . Urolithiasis Brother   . Leukemia Sister 7    AML  . Diabetes Sister   . Hypertension Maternal Aunt   . Breast cancer Maternal Aunt 58  . Hypertension Maternal Uncle   . Colon cancer Maternal Grandmother     dx in her 29s   The patient has two adopted, non-related, children.  She has two sisterda nd three brothers.  One sister was diagnosed with AML at 6.  Her mother passed away at 95.  She had one brother and one sister.  The sister had breast cancer at 76 and is currently 60.  Ms. Carlini maternal grandmother was diagnosed with colon cancer in her 61s. Ms. Urquilla father had lung cancer at 27 in light of being a smoker.  He had four brothers and one sister, none of whom had cancer.  Patient's maternal ancestors are of [place] and [place] descent, and paternal ancestors are of [place] and [place] descent. There is no reported Ashkenazi Jewish ancestry. There is no known consanguinity.  GENETIC COUNSELING ASSESSMENT: MONCERRATH BERHE is a 74 y.o. female with a personal  and family history of breast cancer which somewhat suggestive of a familial, rather than hereditary, breast cancer and predisposition to cancer. We, therefore, discussed and recommended the following at today's visit.   DISCUSSION: We discussed that approximately 5-10% of breast cancer is hereditary and about 20-30% is familial.  Familial breast cancer is associated with post-menopausal family history of breast cancer. We discussed with Ms. Mccown that the family history is not highly consistent with a familial hereditary cancer syndrome, and we feel she is at low risk to harbor a gene mutation associated with such a condition. Thus, we did not recommend any genetic testing, at this time, and recommended Ms. Slatten continue to follow the cancer screening guidelines given  by her primary healthcare provider.  We did mention that that there are options for genetic testing that are relatively low cost.  She could pursue genetic testing through these options and pay out of pocket.  She stated that if her daughter was a biological child she would consider it, but currently is not interested. If Ms. Mayson has one more family member on her mother's side of the family who develops breast cancer she would then meet the medical criteria for testing.  PLAN: Genetic testing was not performed based on Ms. Carradine not meeting medical criteria for Medicare to pursue it.  If Ms. Berens has one more family member on her mother's side of the family who develops breast cancer she would then meet the medical criteria for testing.  She understands and will contact us if that happens.  Lastly, we encouraged Ms. Buskey to remain in contact with cancer genetics annually so that we can continuously update the family history and inform her of any changes in cancer genetics and testing that may be of benefit for this family.   Ms.  Sieh questions were answered to her satisfaction today. Our contact information was provided should  additional questions or concerns arise. Thank you for the referral and allowing [place] to share in the care of your patient.   Karen P. Florene Glen, [place], [facility] Certified Genetic Counselor [email or web address] phone: [phone]  The patient was seen for a total of 30 minutes in face-to-face genetic counseling.  This patient was discussed with Drs. Magrinat, Lindi Adie and/or Burr Medico who agrees with the above.    _______________________________________________________________________ For Office Staff:  Number of people involved in session: 1 Was an Intern/ student involved with case: no

## 2014-12-19 ENCOUNTER — Encounter: Payer: Self-pay | Admitting: Adult Health

## 2014-12-19 NOTE — Progress Notes (Signed)
A birthday card was mailed to the patient today on behalf of the Survivorship Program at [facility].   Vickee Mormino, NP Survivorship Program [facility] [phone]  

## 2014-12-27 ENCOUNTER — Ambulatory Visit: Payer: Medicare Other | Admitting: Radiation Oncology

## 2015-01-03 ENCOUNTER — Encounter: Payer: Self-pay | Admitting: Radiation Oncology

## 2015-01-03 ENCOUNTER — Ambulatory Visit
Admission: RE | Admit: 2015-01-03 | Discharge: 2015-01-03 | Disposition: A | Discharge status: Home / Self Care | Payer: Medicare Other | Source: Ambulatory Visit | Attending: Radiation Oncology | Admitting: Radiation Oncology

## 2015-01-03 VITALS — BP 140/65 | HR 81 | Temp 98.1°F | Ht 65.0 in | Wt 182.6 lb

## 2015-01-03 DIAGNOSIS — D0511 Intraductal carcinoma in situ of right breast: Secondary | ICD-10-CM | POA: Diagnosis not present

## 2015-01-03 DIAGNOSIS — C50511 Malignant neoplasm of lower-outer quadrant of right female breast: Secondary | ICD-10-CM

## 2015-01-03 NOTE — Progress Notes (Signed)
Kenn File here for follow up. She denies pain.  She is fatigued due to jet lag from a trip to [place].  She is taking tamoxifen.  The skin on her right breast is intact.    BP 140/65 mmHg  Pulse 81  Temp(Src) 98.1 F (36.7 C) (Oral)  Ht 5\' 5"  (1.651 m)  Wt 182 lb 9.6 oz (82.827 kg)  BMI 30.39 kg/m2

## 2015-01-03 NOTE — Progress Notes (Signed)
Radiation Oncology         [phone] ________________________________  Name: Debbie Joseph MRN: [id number]  Date: 01/03/2015  DOB: 01-Nov-1940  Follow-Up Visit Note  CC: Shirline Frees, MD  Excell Seltzer, MD   Diagnosis:  High-grade intraductal breast cancer presenting in the lower outer quadrant of the right breast    Interval Since Last Radiation:  4 months. 07/05/2014-08/22/2014 (Right breast 50.4 Gy, lumpectomy cavity boost 10 Gy)  Prior history of intraductal carcinoma of the left breast, high-grade presenting in the upper inner quadrant. Patient underwent excisional biopsy and postoperative radiation therapy under my direction in 2002. The patient completed her treatments 03/04/2001. The upper inner aspect of the left breast revealed received 62.4 gray. The left breast received 50.4 gray. The patient reports she was on Evista for approximately 10 years after this therapy.   Narrative:  The patient returns today for routine follow-up. She denies pain, but is he is fatigued due to jet lag from a trip to [place]. She is taking tamoxifen. The skin on her right breast is intact. The pt denies itching, nipple discharge/bleeding, and arm swelling. Saw Dr. Excell Seltzer this morning,  ALLERGIES:  is allergic to evista.  Meds: Current Outpatient Prescriptions  Medication Sig Dispense Refill  . hydroxypropyl methylcellulose / hypromellose (ISOPTO TEARS / GONIOVISC) 2.5 % ophthalmic solution Place 1 drop into both eyes daily as needed for dry eyes.     [name] losartan-hydrochlorothiazide (HYZAAR) 50-12.5 MG per tablet TAKE ONE TABLET BY MOUTH ONCE DAILY 90 tablet 3  . omeprazole (PRILOSEC) 20 MG capsule Take 20 mg by mouth daily with breakfast.     . RED YEAST RICE EXTRACT PO Take 1 tablet by mouth daily.     . tamoxifen (NOLVADEX) 20 MG tablet Take 20 mg by mouth daily.    [name] losartan-hydrochlorothiazide (HYZAAR) 50-12.5 MG per tablet TAKE ONE TABLET BY MOUTH ONCE DAILY (Patient not taking:  Reported on 01/03/2015) 90 tablet 0   No current facility-administered medications for this encounter.    Physical Findings: The patient is in no acute distress. Patient is alert and oriented.  height is 5\' 5"  (1.651 m) and weight is 182 lb 9.6 oz (82.827 kg). Her oral temperature is 98.1 F (36.7 C). Her blood pressure is 140/65 and her pulse is 81.   No palpable supraclavicular or axillary adenopathy. Lungs are clear to auscultation. The heart has a regular rhythm and rate. The skin of the right breast is well healed. Examination of the right breast reveals some mild edema in the nipple areolar complex. The lumpectomy scar is well healed with no signs of recurrence. No dominant masses appreciated within the breast. No nipple discharge or bleeding. No palpable mass nipple discharge or bleeding noted in the left breast, lumpectomy scar well healed   Lab Findings: Lab Results  Component Value Date   WBC 7.7 05/15/2014   HGB 13.7 05/15/2014   HCT 42.4 05/15/2014   MCV 89.6 05/15/2014   PLT 190 05/15/2014    Radiographic Findings: No results found.  Impression:  The patient is recovering from the effects of radiation.  No evidence of recurrence on clinical exam today.  Plan: The pt is scheduled to see Dr. Jana Hakim early next year after her scheduled mammogram. Since she is being closely monitored by surgery and med/onc, I will only see her on a PRN basis. If she has any questions or concerns, she could call.  This document serves as a record of services personally  performed by Gery Pray, MD. It was created on his behalf by Darcus Austin, a trained medical scribe. The creation of this record is based on the scribe's personal observations and the provider's statements to them. This document has been checked and approved by the attending provider.  ____________________________________  Blair Promise, PhD, MD

## 2015-01-10 DIAGNOSIS — Z23 Encounter for immunization: Secondary | ICD-10-CM | POA: Diagnosis not present

## 2015-01-11 ENCOUNTER — Telehealth: Payer: Self-pay | Admitting: Cardiovascular Disease

## 2015-01-11 MED ORDER — LOSARTAN POTASSIUM-HCTZ 50-12.5 MG PO TABS: 1.0000 | ORAL_TABLET | Freq: Every day | ORAL | Status: DC

## 2015-01-11 NOTE — Telephone Encounter (Signed)
Refill submitted to patient's preferred pharmacy. Informed patient. Pt voiced understanding, no other stated concerns at this time.  

## 2015-01-11 NOTE — Telephone Encounter (Signed)
°  1. Which medications need to be refilled? Losartan HCT-pt needs this today,she is completely out of it.  2. Which pharmacy is medication to be sent to?[phone]  3. Do they need a 30 day or 90 day supply? 90 and refills  4. Would they like a call back once the medication has been sent to the pharmacy? yes

## 2015-01-26 ENCOUNTER — Encounter (HOSPITAL_COMMUNITY): Payer: Self-pay

## 2015-02-19 DIAGNOSIS — K219 Gastro-esophageal reflux disease without esophagitis: Secondary | ICD-10-CM | POA: Diagnosis not present

## 2015-02-19 DIAGNOSIS — C50911 Malignant neoplasm of unspecified site of right female breast: Secondary | ICD-10-CM | POA: Diagnosis not present

## 2015-02-19 DIAGNOSIS — I10 Essential (primary) hypertension: Secondary | ICD-10-CM | POA: Diagnosis not present

## 2015-02-19 DIAGNOSIS — E784 Other hyperlipidemia: Secondary | ICD-10-CM | POA: Diagnosis not present

## 2015-05-11 DIAGNOSIS — H04123 Dry eye syndrome of bilateral lacrimal glands: Secondary | ICD-10-CM | POA: Diagnosis not present

## 2015-05-11 DIAGNOSIS — H43811 Vitreous degeneration, right eye: Secondary | ICD-10-CM | POA: Diagnosis not present

## 2015-05-16 ENCOUNTER — Encounter: Payer: Self-pay | Admitting: Nurse Practitioner

## 2015-05-16 ENCOUNTER — Ambulatory Visit (HOSPITAL_BASED_OUTPATIENT_CLINIC_OR_DEPARTMENT_OTHER): Payer: Medicare Other | Admitting: Nurse Practitioner

## 2015-05-16 ENCOUNTER — Other Ambulatory Visit: Payer: Self-pay | Admitting: *Deleted

## 2015-05-16 ENCOUNTER — Telehealth: Payer: Self-pay | Admitting: Oncology

## 2015-05-16 VITALS — BP 135/55 | HR 77 | Temp 98.1°F | Resp 18 | Ht 65.0 in | Wt 184.7 lb

## 2015-05-16 DIAGNOSIS — C50511 Malignant neoplasm of lower-outer quadrant of right female breast: Secondary | ICD-10-CM

## 2015-05-16 DIAGNOSIS — R232 Flushing: Secondary | ICD-10-CM

## 2015-05-16 DIAGNOSIS — Z853 Personal history of malignant neoplasm of breast: Secondary | ICD-10-CM | POA: Diagnosis not present

## 2015-05-16 DIAGNOSIS — R4189 Other symptoms and signs involving cognitive functions and awareness: Secondary | ICD-10-CM

## 2015-05-16 DIAGNOSIS — Z170 Estrogen receptor positive status [ER+]: Secondary | ICD-10-CM

## 2015-05-16 DIAGNOSIS — D0512 Intraductal carcinoma in situ of left breast: Secondary | ICD-10-CM

## 2015-05-16 MED ORDER — TAMOXIFEN CITRATE 20 MG PO TABS: 20.0000 mg | ORAL_TABLET | Freq: Every day | ORAL | Status: DC

## 2015-05-16 NOTE — Progress Notes (Signed)
[facility]  [phone] [phone]     ID: Debbie Joseph DOB: 1941/03/24  MR#: [id number]  [id number]  Patient Care Team: Shirline Frees, MD as PCP - General (Family Medicine) PCP: Shirline Frees, MD GYN: SU: Excell Seltzer  OTHER MD: Gery Pray M.D., Quay Burow M.D.  CHIEF COMPLAINT: Noninvasive breast cancer  CURRENT TREATMENT: tamoxifen   BREAST CANCER HISTORY: From the original intake note:   Debbie Joseph was first diagnosed with breast cancer in 11/19/2000, when she underwent biopsy of a left breast ductal carcinoma in situ under Marylene Buerger. She underwent a fuller excision 12/18/2000 and achieve clear margins, although the closest margin remained at 1 mm. She then received radiation therapy and took raloxifene/Evista for approximately 5 years. She discontinued the medication partly because of cost and partly because of concerns regarding arthralgias and myalgias, which however most likely were not related.  She then did fine until 04/17/2014 where bilateral screening mammography with tomography at the breast Center showed new calcifications in the right breast. On 05/01/2014 she underwent right diagnostic mammography which found the breast density to be category C. There were fine pleomorphic calcifications spanning approximately 5 cm in the lower outer quadrant of the right breast. There was no associated mass.  On the same day needle core biopsy was obtained and showed ([id number]) ductal carcinoma in situ, high-grade, estrogen receptor 88% positive, with strong staining intensity; progesterone receptor 60% positive, with moderate staining intensity.  On 05/04/2014 the patient underwent bilateral breast MRI, which showed no findings of interest in the left breast and no abnormal appearing lymph nodes. In the right breast lower outer quadrant there was a 4.57 m area of clumped nodular enhancement. This was felt to correspond closely to the  extent of mammographic calcifications.  Accordingly on 05/08/2014 the patient underwent right lumpectomy with additional right margin resection. The pathology from this procedure ([id number]) showed ductal carcinoma in situ, high-grade, with no invasive component. It measured 3 cm in greatest dimension. There was an additional centimeter of disease associated with the right medial margin portion, but even so margins remained extremely close at less than a millimeter.  Accordingly on 05/15/2014 the patient underwent further surgery for margin clearance. Excision of the right medial and right superior margins found no residual carcinoma.  The patient's subsequent history is as detailed below  INTERVAL HISTORY: Debbie Joseph for follow-up of her noninvasive breast cancer, alone. She has been on tamoxifen since May 2016 and tolerates this drug reasonably well. She has noticed an uptick in her hot flashes, especially at night, but she prefers to manage them on her own. She denies vaginal changes. The interval history is remarkable for a cognitive decline. Her short term memory is poor. She has had trouble focusing on tasks and will very easily forget why she opened her laptop or why she walked into a room, more so than usual. She is easily distracted. She can be in the middle of a sentence and forget her point. She wonders if this is due to the tamoxifen or some other medicine.   REVIEW OF SYSTEMS: Debbie Joseph denies pain, fevers, chills, nausea, vomiting, or changes in bowel or bladder habits. She has dry eyes and is on a prescription eye drop for this. Her physical activity has dropped off. She blames losartan for this and lack of motivation. She continues to travel the world on mission trips. She has hand stiffness occasionally. She denies shortness of breath, chest pain, cough, or palpitations.  A detailed review of systems is otherwise stable.  PAST MEDICAL HISTORY: Past Medical History  Diagnosis Date  .  Hypertension   . Breast cancer     left breast  . GERD (gastroesophageal reflux disease)   . Dyspnea on exertion   . Family history of adverse reaction to anesthesia     maternal aunt- slow to wake up   . Headache     hx of migraines greater than 20 years ago   . Breast cancer, right breast   . Radiation 07/04/14-08/22/14    right breast 50.4 Gy, lumpectomy cavity boosted to 10 Gy  . Family history of breast cancer     PAST SURGICAL HISTORY: Past Surgical History  Procedure Laterality Date  . Partial hysterectomy  1983  . Umbilical hernia repair  1974  . Breast surgery  2002    lt breast lump-cancer  . Appendectomy  1974    with hernia  . Colonoscopy    . Breast lumpectomy with needle localization Right 05/08/2014    Procedure: BREAST LUMPECTOMY WITH NEEDLE LOCALIZATION;  Surgeon: Excell Seltzer, MD;  Location: [facility];  Service: General;  Laterality: Right;  . Re-excision of breast lumpectomy Right 05/15/2014    Procedure: RE-EXCISION OF BREAST LUMPECTOMY SUPERIOR AND MEDIAL MARGINS;  Surgeon: Excell Seltzer, MD;  Location: [facility];  Service: General;  Laterality: Right;  . Cataract extraction Bilateral 2009  . Dental inplant  2015    FAMILY HISTORY Family History  Problem Relation Age of Onset  . Stroke Mother 51  . Hypertension Mother   . Lung cancer Father 20  . Stroke Maternal Grandfather 60  . Hypertension Maternal Grandfather   . Tuberculosis Paternal Grandmother   . Hypertension Brother   . Diabetes Brother   . Urolithiasis Brother   . Leukemia Sister 66    AML  . Diabetes Sister   . Hypertension Maternal Aunt   . Breast cancer Maternal Aunt 58  . Hypertension Maternal Uncle   . Colon cancer Maternal Grandmother     dx in her 5s   the patient's father died from lung cancer at the age of 46, in the setting of tobacco abuse. The patient's mother died at the age of 24. The patient's mother only sister was diagnosed with breast cancer at the  age of 41. Debbie Joseph herself has 3 brothers and 2 sisters. One of her sisters was diagnosed with acute myeloid leukemia at the age of 81. She survives. A maternal grandmother may have had colon or other cancer, diagnosed in her 41s. There is no other history of cancer in the family to the patient's knowledge  GYNECOLOGIC HISTORY:  No LMP recorded. Patient has had a hysterectomy. Menarche age 83 or the patient is GX P0. She took hormone replacement for approximately 15 years, and fertility treatments again very briefly. 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 She and her husband do quite a bit of [place] work. 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 There are 2 adopted children are [name] lives in [facility] and is currently looking for a job (his will be teaching at [employer] there), and [place] lives in [place] and is a definite card specialist. The patient has no grandchildren.    ADVANCED DIRECTIVES: Not in place; at her 06/26/2014  visit the patient was given the appropriate documents 2 complete and not arise at her discretion.  HEALTH MAINTENANCE: Social History  Substance Use Topics  . Smoking status: Never Smoker   . Smokeless tobacco: Never Used  . Alcohol Use: No     Colonoscopy: 2008?/[facility]  PAP: Status post hysterectomy  Bone density: 2007/normal  Lipid panel:  Allergies  Allergen Reactions  . Evista [Raloxifene] Other (See Comments)    Made joints hurt "really bad"    Current Outpatient Prescriptions  Medication Sig Dispense Refill  . hydroxypropyl methylcellulose / hypromellose (ISOPTO TEARS / GONIOVISC) 2.5 % ophthalmic solution Place 1 drop into both eyes 4 (four) times daily.     [name] losartan-hydrochlorothiazide (HYZAAR) 50-12.5 MG per tablet Take 1 tablet by mouth daily. 90  tablet 3  . Multiple Vitamins-Minerals (WOMENS 50+ MULTI VITAMIN/MIN PO) Take 1 tablet by mouth daily.    [name] omeprazole (PRILOSEC) 20 MG capsule Take 20 mg by mouth daily with breakfast.     . RED YEAST RICE EXTRACT PO Take 1 tablet by mouth daily.     . tamoxifen (NOLVADEX) 20 MG tablet Take 20 mg by mouth daily.    [name] UNABLE TO FIND 1 tablet daily. Vit B12 B6     No current facility-administered medications for this visit.    OBJECTIVE: Middle-aged white woman who appears well Filed Vitals:   05/16/15 1515  BP: 135/55  Pulse: 77  Temp: 98.1 F (36.7 C)  Resp: 18     Body mass index is 30.74 kg/(m^2).    ECOG FS:0 - Asymptomatic  Skin: warm, dry  HEENT: sclerae anicteric, conjunctivae pink, oropharynx clear. No thrush or mucositis.  Lymph Nodes: No cervical or supraclavicular lymphadenopathy  Lungs: clear to auscultation bilaterally, no rales, wheezes, or rhonci  Heart: regular rate and rhythm  Abdomen: round, soft, non tender, positive bowel sounds  Musculoskeletal: No focal spinal tenderness, no peripheral edema  Neuro: non focal, well oriented, positive affect  Breasts: right breast status post lumpectomy and radiation. No evidence of recurrent disease. Right axilla benign. Left breast status post remote lumpectomy, no other abnormalities noted.  LAB RESULTS:  CMP     Component Value Date/Time   NA 141 05/15/2014 0800   K 4.3 05/15/2014 0800   CL 106 05/15/2014 0800   CO2 29 05/15/2014 0800   GLUCOSE 127* 05/15/2014 0800   BUN 22 05/15/2014 0800   CREATININE 0.74 05/15/2014 0800   CREATININE 0.76 02/22/2014 0855   CALCIUM 9.1 05/15/2014 0800   PROT 6.5 10/31/2014 0842   ALBUMIN 4.0 10/31/2014 0842   AST 15 10/31/2014 0842   ALT 13 10/31/2014 0842   ALKPHOS 52 10/31/2014 0842   BILITOT 0.3 10/31/2014 0842   GFRNONAA 83* 05/15/2014 0800   GFRAA >90 05/15/2014 0800    INo results found for: SPEP, UPEP  Lab Results  Component Value Date   WBC 7.7 05/15/2014    HGB 13.7 05/15/2014   HCT 42.4 05/15/2014   MCV 89.6 05/15/2014   PLT 190 05/15/2014      Chemistry      Component Value Date/Time   NA 141 05/15/2014 0800   K 4.3 05/15/2014 0800   CL 106 05/15/2014 0800   CO2 29 05/15/2014 0800   BUN 22 05/15/2014 0800   CREATININE 0.74 05/15/2014 0800   CREATININE 0.76 02/22/2014 0855      Component Value Date/Time   CALCIUM 9.1 05/15/2014 0800   ALKPHOS 52 10/31/2014 0842  AST 15 10/31/2014 0842   ALT 13 10/31/2014 0842   BILITOT 0.3 10/31/2014 0842       No results found for: LABCA2  No components found for: [id number]  No results for input(s): INR in the last 168 hours.  Urinalysis No results found for: COLORURINE, APPEARANCEUR, LABSPEC, PHURINE, GLUCOSEU, HGBUR, BILIRUBINUR, KETONESUR, PROTEINUR, UROBILINOGEN, NITRITE, LEUKOCYTESUR  STUDIES: No results found.  ASSESSMENT: 75 y.o. [place] woman  (1) status post left breast biopsy 11/19/2000 for ductal carcinoma in situ, high-grade followed by reexcision for clear margins 12/18/2000  (a) status post adjuvant radiation to the left breast  (b) received raloxifene for approximately 5 years  (2) status post right breast lower outer quadrant core biopsy 05/01/2014 for ductal carcinoma in situ, high-grade, estrogen receptor 88% positive, progesterone receptor 60% positive  (3)  right breast lumpectomy with additional right medial margin surgery 05/08/2014 showed a 3 cm ductal carcinoma in situ, high-grade, less than 1 mm from the final medial margin  (4) right breast reexcision 05/15/2014 showed no residual ductal carcinoma in situ  (5) adjuvant radiation 07/05/2014-08/22/2014: Right breast 50.4 Gy, lumpectomy cavity boost 10 Gy  (6) tamoxifen started May 2016  PLAN: From a breast cancer standpoint, Debbie Joseph is doing well. She understands that she was effectively cured of her DCIS with surgery and radiation and that the tamoxifen is for prevention against a future estrogen  positive breast cancer. She declines the need for any medicines to help control her hot flashes. She is due for a repeat mammogram this month, so I have placed orders for this to be performed at the breast center as soon as possible.  Cognitively, Debbie Joseph is concerned about her decline. I do not believe this is going to be related to tamoxifen, but I explained that if she wanted to take a 4 week "drug holiday" to see if things improve, she is welcome to do so. Her only other theory is that the losartan is making her "cloudy headed" since it does drain her of physical energy already. She plans to move up her visit with Dr. Gwenlyn Found to discuss this. A third consideration is visiting Dr. Valentina Shaggy for a cognitive evaluation. Her grandmother had dementia that began in her 62s. She will call us back when she decides if this is the route she prefers to take.   Debbie Joseph will return in 1 year for labs and a follow up visit. She understands and agrees with this plan. She knows the goal of treatment in her case is prevention. She has been encouraged to call with any issues that might arise before her next visit here.   Laurie Panda, NP   05/16/2015 4:17 PM

## 2015-05-16 NOTE — Telephone Encounter (Signed)
Appointments made and avs printed for patient °

## 2015-05-23 ENCOUNTER — Ambulatory Visit
Admission: RE | Admit: 2015-05-23 | Discharge: 2015-05-23 | Disposition: A | Discharge status: Home / Self Care | Payer: Medicare Other | Source: Ambulatory Visit | Attending: Nurse Practitioner | Admitting: Nurse Practitioner

## 2015-05-23 DIAGNOSIS — R922 Inconclusive mammogram: Secondary | ICD-10-CM | POA: Diagnosis not present

## 2015-05-23 DIAGNOSIS — C50511 Malignant neoplasm of lower-outer quadrant of right female breast: Secondary | ICD-10-CM

## 2015-06-14 DIAGNOSIS — H353131 Nonexudative age-related macular degeneration, bilateral, early dry stage: Secondary | ICD-10-CM | POA: Diagnosis not present

## 2015-06-14 DIAGNOSIS — H04123 Dry eye syndrome of bilateral lacrimal glands: Secondary | ICD-10-CM | POA: Diagnosis not present

## 2015-06-14 DIAGNOSIS — Z961 Presence of intraocular lens: Secondary | ICD-10-CM | POA: Diagnosis not present

## 2015-07-06 DIAGNOSIS — D0511 Intraductal carcinoma in situ of right breast: Secondary | ICD-10-CM | POA: Diagnosis not present

## 2015-08-16 ENCOUNTER — Telehealth: Payer: Self-pay | Admitting: Adult Health

## 2015-08-16 NOTE — Telephone Encounter (Signed)
I received a call from Ms. Demauro letting me know that she and her husband are traveling to [place] next week and she is supposed to start taking chloroquine as a prophylactic anti-malarial agent in preparation for her trip.  She will be on this medication during the month she is in [place].  She has read that there are interactions with her Tamoxifen and several of her other medications and is worried about what she should do.   I ran a drug-drug interaction report for chloroquine with the following medications she had questions about:  -Prilosec: No interaction noted -Losartan/HCTZ: No interaction noted -Tamoxifen: Moderate risk for QTC prolongation, monitor therapy.   I advised Ms. Calamari to stop her Tamoxifen while she is taking the chloroquine for the next 4-6 weeks; this will not negatively impact her chemoprevention for her h/o breast cancer.  The risk of her being exposed to malaria far outweigh the risk of taking the Tamoxifen with the chloroquine while she is doing [place] work.    I encouraged her to ask her pharmacist when it would be appropriate for her to resume her Tamoxifen once she returns to the [place], as often these drugs have lengthy half-lives and it may require she wait a few days after she completes the anti-malarial course to resume her Tamoxifen.  She voiced understanding and agreement with this plan.  I will share this update with Dr. Jana Hakim and Gentry Fitz, NP so they both are aware.  I encouraged Ms. Levesque to call me with any other questions or concerns and wished her safe travels.   Mike Craze, NP [place] [phone]

## 2015-09-08 IMAGING — MG MM SCREENING BREAST TOMO BILATERAL
12 series · 12 of 28 positions shown · non-contrast
Comparison: Previous Exam(s)

ACR Breast Density Category c. Heterogeneous dense fibroglandular
tissue.

CLINICAL DATA: Screening.

EXAM:
DIGITAL SCREENING BILATERAL MAMMOGRAM WITH 3D TOMO WITH CAD

[L MLO]
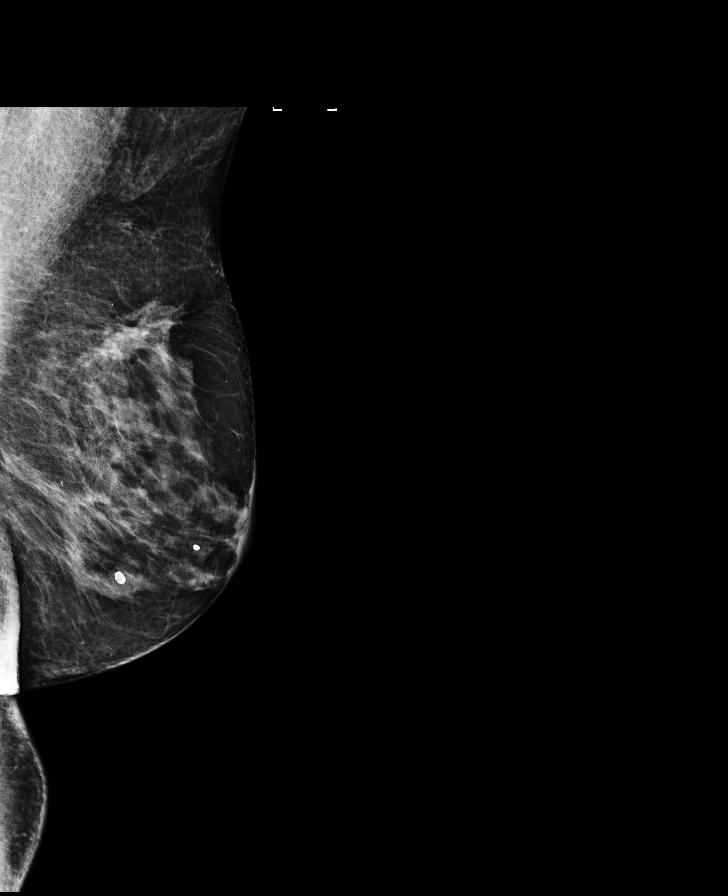

[L CC]
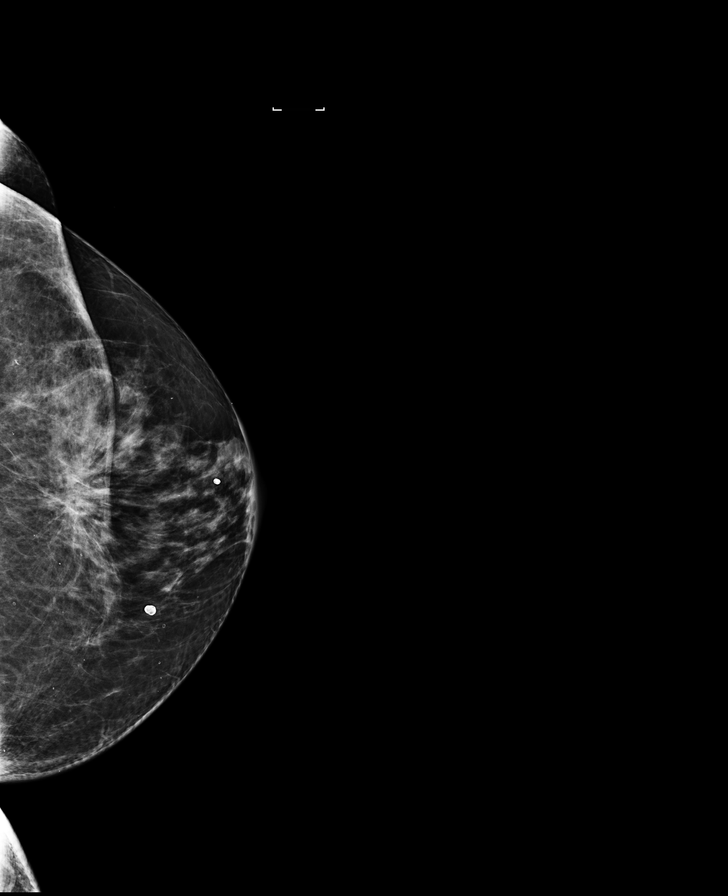

[R CC]
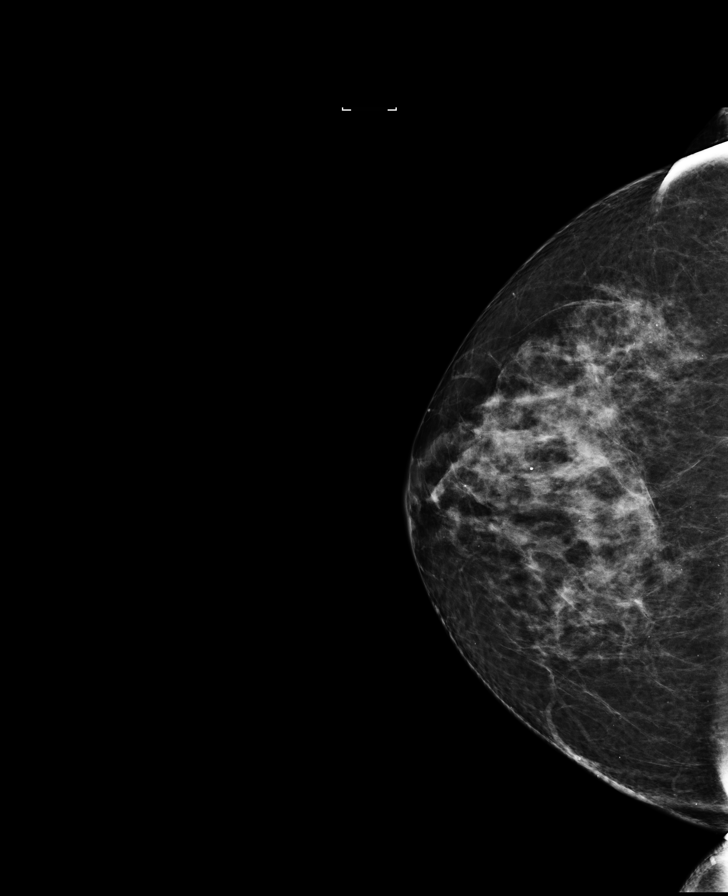

[R MLO]
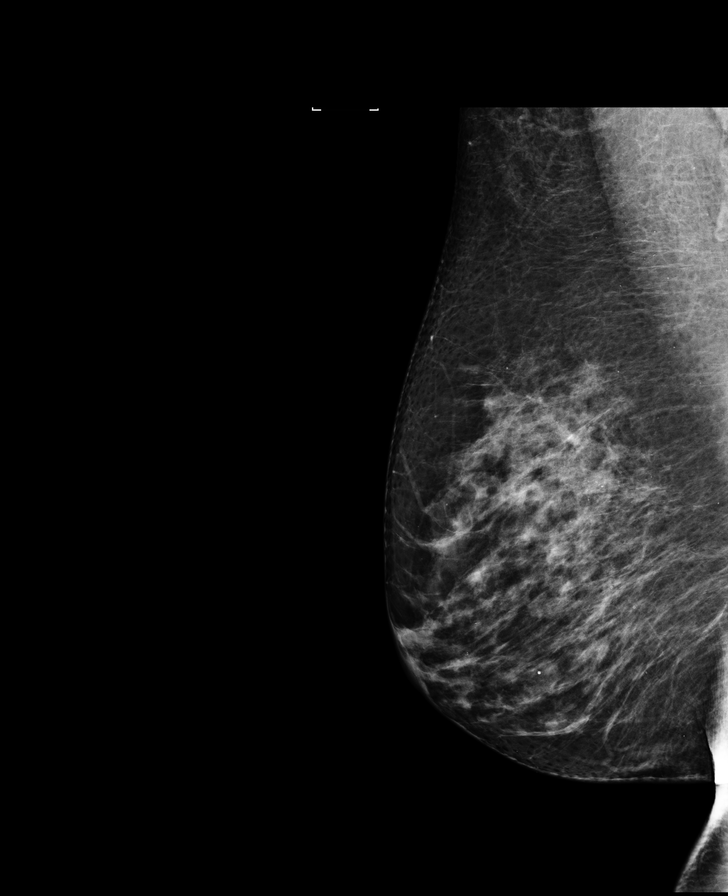

[L MLO tomo (1 of 2)]
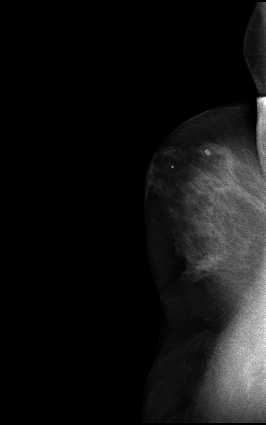

[R MLO tomo (1 of 2)]
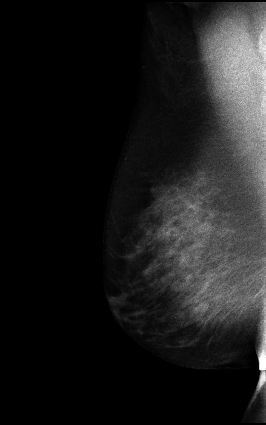

[L CC tomo (1 of 2)]
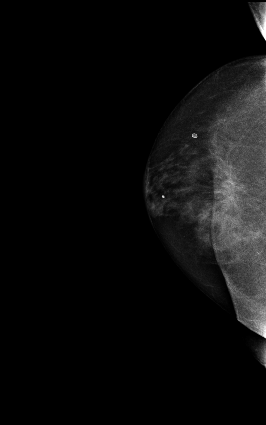

[R CC tomo (1 of 2)]
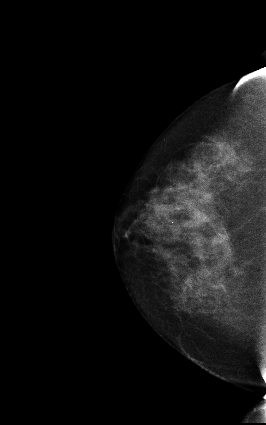

[R MLO tomo (2 of 2) · tomo slice 53/106.0]
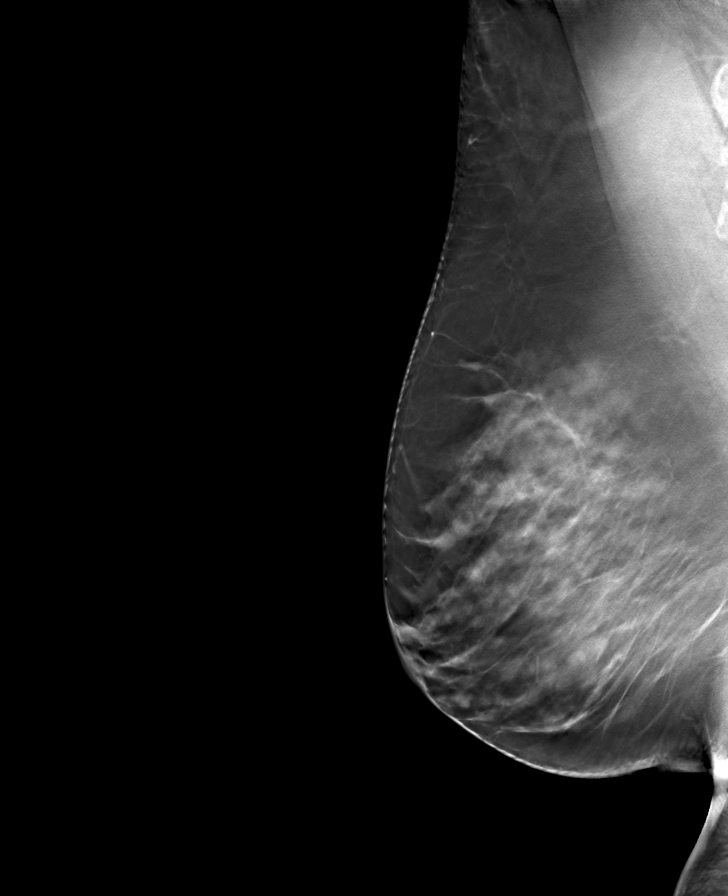

[L CC tomo (2 of 2) · tomo slice 42/83.0]
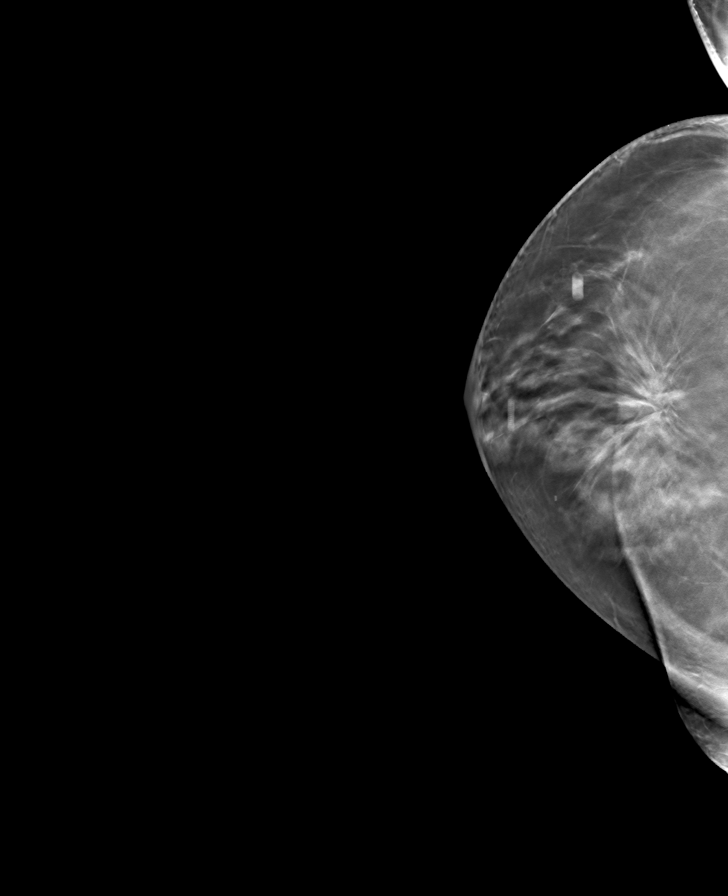

[R CC tomo (2 of 2) · tomo slice 46/91.0]
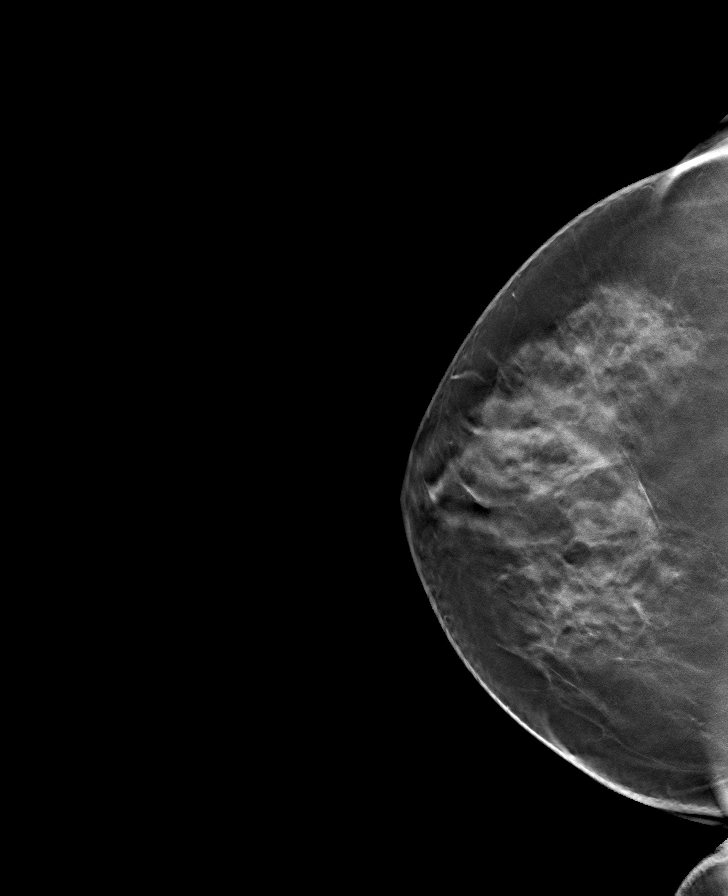

[L MLO tomo (2 of 2) · tomo slice 51/100.0]
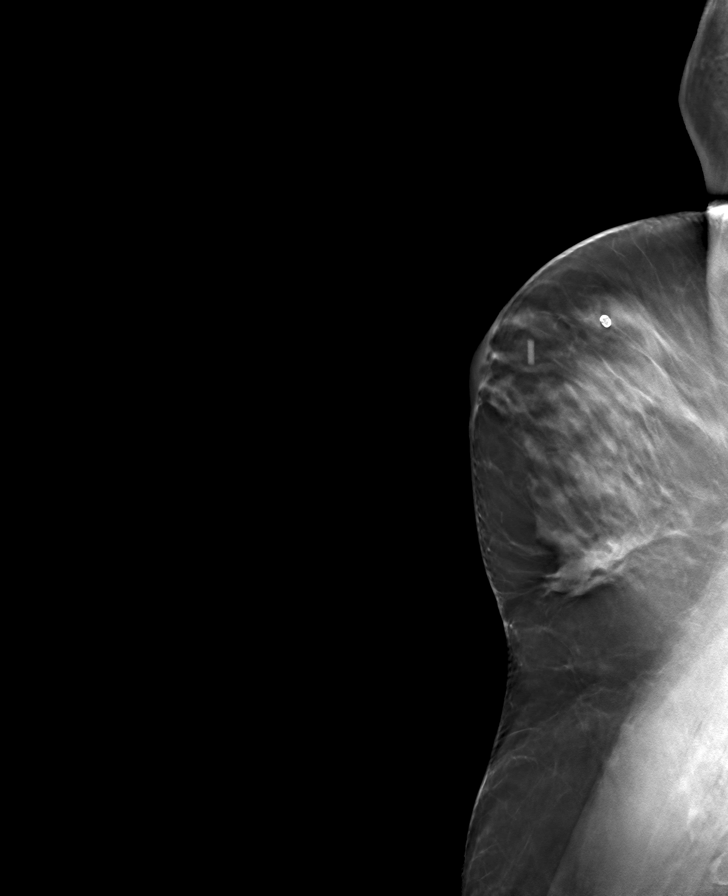

[12 of 28 positions shown; findings below may reference images not displayed]

FINDINGS: In the right breast, calcifications warrant further evaluation with
magnified views. In the left breast, no findings suspicious for
malignancy. Images were processed with CAD.
IMPRESSION: Further evaluation is suggested for calcifications in the right
breast.

RECOMMENDATION:
Diagnostic mammogram of the right breast. (Code:[id number]-G-GG5)

The patient will be contacted regarding the findings, and additional
imaging will be scheduled.

BI-RADS CATEGORY  0: Incomplete. Need additional imaging evaluation
and/or prior mammograms for comparison.

## 2015-10-05 DIAGNOSIS — I10 Essential (primary) hypertension: Secondary | ICD-10-CM | POA: Diagnosis not present

## 2015-10-05 DIAGNOSIS — J209 Acute bronchitis, unspecified: Secondary | ICD-10-CM | POA: Diagnosis not present

## 2015-10-05 DIAGNOSIS — E7800 Pure hypercholesterolemia, unspecified: Secondary | ICD-10-CM | POA: Diagnosis not present

## 2015-10-17 ENCOUNTER — Ambulatory Visit
Admission: RE | Admit: 2015-10-17 | Discharge: 2015-10-17 | Disposition: A | Discharge status: Home / Self Care | Payer: Medicare Other | Source: Ambulatory Visit | Attending: Family Medicine | Admitting: Family Medicine

## 2015-10-17 ENCOUNTER — Other Ambulatory Visit: Payer: Self-pay | Admitting: Family Medicine

## 2015-10-17 DIAGNOSIS — R079 Chest pain, unspecified: Secondary | ICD-10-CM

## 2015-10-17 DIAGNOSIS — R05 Cough: Secondary | ICD-10-CM | POA: Diagnosis not present

## 2015-10-17 DIAGNOSIS — R059 Cough, unspecified: Secondary | ICD-10-CM

## 2015-10-30 DIAGNOSIS — I10 Essential (primary) hypertension: Secondary | ICD-10-CM | POA: Diagnosis not present

## 2015-11-01 DIAGNOSIS — R739 Hyperglycemia, unspecified: Secondary | ICD-10-CM | POA: Diagnosis not present

## 2015-11-06 ENCOUNTER — Ambulatory Visit: Payer: Medicare Other | Admitting: Cardiovascular Disease

## 2015-11-09 DIAGNOSIS — W57XXXA Bitten or stung by nonvenomous insect and other nonvenomous arthropods, initial encounter: Secondary | ICD-10-CM | POA: Diagnosis not present

## 2015-11-09 DIAGNOSIS — S30861A Insect bite (nonvenomous) of abdominal wall, initial encounter: Secondary | ICD-10-CM | POA: Diagnosis not present

## 2015-11-09 DIAGNOSIS — E119 Type 2 diabetes mellitus without complications: Secondary | ICD-10-CM | POA: Diagnosis not present

## 2015-12-20 DIAGNOSIS — Z23 Encounter for immunization: Secondary | ICD-10-CM | POA: Diagnosis not present

## 2015-12-25 ENCOUNTER — Ambulatory Visit (INDEPENDENT_AMBULATORY_CARE_PROVIDER_SITE_OTHER): Payer: Medicare Other | Admitting: Cardiovascular Disease

## 2015-12-25 ENCOUNTER — Encounter: Payer: Self-pay | Admitting: Cardiovascular Disease

## 2015-12-25 VITALS — BP 132/80 | HR 75 | Ht 65.0 in | Wt 178.0 lb

## 2015-12-25 DIAGNOSIS — I10 Essential (primary) hypertension: Secondary | ICD-10-CM

## 2015-12-25 DIAGNOSIS — R0609 Other forms of dyspnea: Secondary | ICD-10-CM | POA: Diagnosis not present

## 2015-12-25 NOTE — Progress Notes (Signed)
12/25/2015 Debbie Joseph   Sep 13, 1940  [id number]  Primary Physician Shirline Frees, MD Primary Cardiologist: Lorretta Harp MD Debbie Joseph, [place]  HPI:  Debbie Joseph is a 75 year old moderately overweight married Caucasian female mother of 2 children who I last saw in the office 10/31/14.[name] She was referred by Dr. Kenton Kingfisher for cardiovascular evaluation because of poorly controlled blood pressure and increasing dyspnea on exertion. She has a history of blood pressure for years. She does not watch her salt intake. There are no other cardiovascular risk factors. She does not have a family history of heart disease. She has never had a heart attack or stroke. She denies chest pain but does relate a history of increasing dyspnea on exertion over the last 12 months. When Dr. Kenton Kingfisher double-decker losartan from 50-100 mg her blood pressure was better controlled although she was symptomatic from this. I saw her back 12/08/13 I performed a 2-D echo that was entirely normal and renal Doppler studies that ruled out renal vascular disease. Since I saw her back in October she has developed recurrent breast cancer and has had surgery and radiation therapy. She also joined the [facility] and has been exercising fairly regularly and watching her diet. She feels clinically improved since I saw her last. She was recently diagnosed with diabetes however.    Current Outpatient Prescriptions  Medication Sig Dispense Refill  . hydroxypropyl methylcellulose / hypromellose (ISOPTO TEARS / GONIOVISC) 2.5 % ophthalmic solution Place 1 drop into both eyes 4 (four) times daily.     . irbesartan-hydrochlorothiazide (AVALIDE) 150-12.5 MG tablet Take 1 tablet by mouth daily.    . Multiple Vitamins-Minerals (WOMENS 50+ MULTI VITAMIN/MIN PO) Take 1 tablet by mouth daily.    . RED YEAST RICE EXTRACT PO Take 1 tablet by mouth daily.     . tamoxifen (NOLVADEX) 20 MG tablet Take 1 tablet (20 mg total) by mouth daily. 30 tablet 12  .  UNABLE TO FIND 1 tablet daily. Vit B12 B6     No current facility-administered medications for this visit.     Allergies  Allergen Reactions  . Evista [Raloxifene] Other (See Comments)    Made joints hurt "really bad"    Social History   Social History  . Marital status: Married    Spouse name: N/A  . Number of children: 2  . Years of education: N/A   Occupational History  . teacher    Social History Main Topics  . Smoking status: Never Smoker  . Smokeless tobacco: Never Used  . Alcohol use No  . Drug use: No  . Sexual activity: Not on file   Other Topics Concern  . Not on file   Social History Narrative  . No narrative on file     Review of Systems: General: negative for chills, fever, night sweats or weight changes.  Cardiovascular: negative for chest pain, dyspnea on exertion, edema, orthopnea, palpitations, paroxysmal nocturnal dyspnea or shortness of breath Dermatological: negative for rash Respiratory: negative for cough or wheezing Urologic: negative for hematuria Abdominal: negative for nausea, vomiting, diarrhea, bright red blood per rectum, melena, or hematemesis Neurologic: negative for visual changes, syncope, or dizziness All other systems reviewed and are otherwise negative except as noted above.    Blood pressure 132/80, pulse 75, height 5\' 5"  (1.651 m), weight 178 lb (80.7 kg).  General appearance: alert and no distress Neck: no adenopathy, no carotid bruit, no JVD, supple, symmetrical, trachea midline and thyroid not  enlarged, symmetric, no tenderness/mass/nodules Lungs: clear to auscultation bilaterally Heart: regular rate and rhythm, S1, S2 normal, no murmur, click, rub or gallop Extremities: extremities normal, atraumatic, no cyanosis or edema  EKG normal sinus rhythm at 75 with poor R-wave progression. Pursue reviewed this EKG  ASSESSMENT AND PLAN:   Essential hypertension History of hypertension blood pressure measures 132/80. She is  on Avalide. Continue current meds at current dosing  Dyspnea on exertion History of dyspnea on exertion for normal echo back in 2015. She did have history of breast cancer status post radiation therapy twice. She denies chest pain.      Lorretta Harp MD FACP,FACC,FAHA, [facility] 12/25/2015 12:33 PM

## 2015-12-25 NOTE — Assessment & Plan Note (Signed)
History of hypertension blood pressure measures 132/80. She is on Avalide. Continue current meds at current dosing

## 2015-12-25 NOTE — Patient Instructions (Signed)

## 2015-12-25 NOTE — Assessment & Plan Note (Signed)
History of dyspnea on exertion for normal echo back in 2015. She did have history of breast cancer status post radiation therapy twice. She denies chest pain.

## 2016-01-30 DIAGNOSIS — D0511 Intraductal carcinoma in situ of right breast: Secondary | ICD-10-CM | POA: Diagnosis not present

## 2016-04-16 ENCOUNTER — Other Ambulatory Visit: Payer: Self-pay | Admitting: Family Medicine

## 2016-04-16 DIAGNOSIS — Z853 Personal history of malignant neoplasm of breast: Secondary | ICD-10-CM

## 2016-05-01 DIAGNOSIS — E7800 Pure hypercholesterolemia, unspecified: Secondary | ICD-10-CM | POA: Diagnosis not present

## 2016-05-01 DIAGNOSIS — I10 Essential (primary) hypertension: Secondary | ICD-10-CM | POA: Diagnosis not present

## 2016-05-01 DIAGNOSIS — E119 Type 2 diabetes mellitus without complications: Secondary | ICD-10-CM | POA: Diagnosis not present

## 2016-05-12 ENCOUNTER — Telehealth: Payer: Self-pay | Admitting: Oncology

## 2016-05-12 NOTE — Telephone Encounter (Signed)
Pt called to r/s 1/25 appt due to death in the family. Pt request new appt. Gave pt new appt date/time 2/20 at 10 am per request

## 2016-05-15 ENCOUNTER — Ambulatory Visit: Payer: Medicare Other | Admitting: Oncology

## 2016-05-23 ENCOUNTER — Ambulatory Visit
Admission: RE | Admit: 2016-05-23 | Discharge: 2016-05-23 | Disposition: A | Discharge status: Home / Self Care | Payer: Medicare Other | Source: Ambulatory Visit | Attending: Family Medicine | Admitting: Family Medicine

## 2016-05-23 DIAGNOSIS — Z853 Personal history of malignant neoplasm of breast: Secondary | ICD-10-CM

## 2016-05-23 DIAGNOSIS — R922 Inconclusive mammogram: Secondary | ICD-10-CM | POA: Diagnosis not present

## 2016-06-10 ENCOUNTER — Ambulatory Visit (HOSPITAL_BASED_OUTPATIENT_CLINIC_OR_DEPARTMENT_OTHER): Payer: Medicare Other | Admitting: Oncology

## 2016-06-10 VITALS — BP 135/60 | HR 85 | Temp 97.7°F | Resp 18 | Ht 65.0 in | Wt 169.5 lb

## 2016-06-10 DIAGNOSIS — C50511 Malignant neoplasm of lower-outer quadrant of right female breast: Secondary | ICD-10-CM

## 2016-06-10 DIAGNOSIS — Z79810 Long term (current) use of selective estrogen receptor modulators (SERMs): Secondary | ICD-10-CM

## 2016-06-10 DIAGNOSIS — D0512 Intraductal carcinoma in situ of left breast: Secondary | ICD-10-CM | POA: Diagnosis not present

## 2016-06-10 DIAGNOSIS — E119 Type 2 diabetes mellitus without complications: Secondary | ICD-10-CM | POA: Diagnosis not present

## 2016-06-10 DIAGNOSIS — Z170 Estrogen receptor positive status [ER+]: Secondary | ICD-10-CM | POA: Diagnosis not present

## 2016-06-10 MED ORDER — TAMOXIFEN CITRATE 20 MG PO TABS: 20.0000 mg | ORAL_TABLET | Freq: Every day | ORAL | 12 refills | Status: DC

## 2016-06-10 NOTE — Progress Notes (Signed)
[name]  [phone] Fax:([phone]) [phone]     ID: Debbie Joseph DOB: Mar 12, 1941  MR#: [id number]  [id number]  Patient Care Team: Shirline Frees, MD as PCP - General (Family Medicine) PCP: Shirline Frees, MD GYN: SU: Excell Seltzer  OTHER MD: Gery Pray M.D., Quay Burow M.D.  CHIEF COMPLAINT: Noninvasive breast cancer  CURRENT TREATMENT: tamoxifen   BREAST CANCER HISTORY: From the original intake note:   Elder was first diagnosed with breast cancer in 11/19/2000, when she underwent biopsy of a left breast ductal carcinoma in situ under Marylene Buerger. She underwent a fuller excision 12/18/2000 and achieve clear margins, although the closest margin remained at 1 mm. She then received radiation therapy and took raloxifene/Evista for approximately 5 years. She discontinued the medication partly because of cost and partly because of concerns regarding arthralgias and myalgias, which however most likely were not related.  She then did fine until 04/17/2014 where bilateral screening mammography with tomography at the breast Center showed new calcifications in the right breast. On 05/01/2014 she underwent right diagnostic mammography which found the breast density to be category C. There were fine pleomorphic calcifications spanning approximately 5 cm in the lower outer quadrant of the right breast. There was no associated mass.  On the same day needle core biopsy was obtained and showed ([id number]) ductal carcinoma in situ, high-grade, estrogen receptor 88% positive, with strong staining intensity; progesterone receptor 60% positive, with moderate staining intensity.  On 05/04/2014 the patient underwent bilateral breast MRI, which showed no findings of interest in the left breast and no abnormal appearing lymph nodes. In the right breast lower outer quadrant there was a 4.57 m area of clumped nodular enhancement. This was felt to correspond closely to the  extent of mammographic calcifications.  Accordingly on 05/08/2014 the patient underwent right lumpectomy with additional right margin resection. The pathology from this procedure ([id number]) showed ductal carcinoma in situ, high-grade, with no invasive component. It measured 3 cm in greatest dimension. There was an additional centimeter of disease associated with the right medial margin portion, but even so margins remained extremely close at less than a millimeter.  Accordingly on 05/15/2014 the patient underwent further surgery for margin clearance. Excision of the right medial and right superior margins found no residual carcinoma.  The patient's subsequent history is as detailed below  INTERVAL HISTORY: [place] today for follow-up of her ductal carcinoma in situ. The interval history is significant chiefly for her having been diagnosed with diabetes. She has stress to collect changed her diet, and lost more than 20 pounds, and has brought her a once under control. But she has not done is started a good exercise program.  She continues on tamoxifen with no side effects that she is aware of from that medication. She obtains it at a good price.   REVIEW OF SYSTEMS:  A detailed review of systems today was otherwise noncontributory  PAST MEDICAL HISTORY: Past Medical History:  Diagnosis Date  . Breast cancer ([place])    left breast  . Breast cancer, right breast ([place])   . Dyspnea on exertion   . Family history of adverse reaction to anesthesia    maternal aunt- slow to wake up   . Family history of breast cancer   . GERD (gastroesophageal reflux disease)   . Headache    hx of migraines greater than 20 years ago   . Hypertension   . Radiation 07/04/14-08/22/14   right breast 50.4  Gy, lumpectomy cavity boosted to 10 Gy    PAST SURGICAL HISTORY: Past Surgical History:  Procedure Laterality Date  . APPENDECTOMY  1974   with hernia  . BREAST LUMPECTOMY WITH NEEDLE LOCALIZATION  Right 05/08/2014   Procedure: BREAST LUMPECTOMY WITH NEEDLE LOCALIZATION;  Surgeon: Excell Seltzer, MD;  Location: [facility];  Service: General;  Laterality: Right;  . BREAST SURGERY  2002   lt breast lump-cancer  . CATARACT EXTRACTION Bilateral 2009  . COLONOSCOPY    . dental inplant  2015  . PARTIAL HYSTERECTOMY  1983  . RE-EXCISION OF BREAST LUMPECTOMY Right 05/15/2014   Procedure: RE-EXCISION OF BREAST LUMPECTOMY SUPERIOR AND MEDIAL MARGINS;  Surgeon: Excell Seltzer, MD;  Location: [facility];  Service: General;  Laterality: Right;  . UMBILICAL HERNIA REPAIR  1974    FAMILY HISTORY Family History  Problem Relation Age of Onset  . Stroke Mother 35  . Hypertension Mother   . Lung cancer Father 64  . Stroke Maternal Grandfather 60  . Hypertension Maternal Grandfather   . Tuberculosis Paternal Grandmother   . Hypertension Brother   . Diabetes Brother   . Urolithiasis Brother   . Leukemia Sister 87    AML  . Diabetes Sister   . Hypertension Maternal Aunt   . Breast cancer Maternal Aunt 58  . Hypertension Maternal Uncle   . Colon cancer Maternal Grandmother     dx in her 19s   the patient's father died from lung cancer at the age of 84, in the setting of tobacco abuse. The patient's mother died at the age of 69. The patient's mother only sister was diagnosed with breast cancer at the age of 49. Debbie Joseph herself has 3 brothers and 2 sisters. One of her sisters was diagnosed with acute myeloid leukemia at the age of 62. She survives. A maternal grandmother may have had colon or other cancer, diagnosed in her 39s. There is no other history of cancer in the family to the patient's knowledge  GYNECOLOGIC HISTORY:  No LMP recorded. Patient has had a hysterectomy. Menarche age 76 or the patient is GX P0. She took hormone replacement for approximately 15 years, and fertility treatments again very briefly. 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 She and her husband do quite a bit of [place] work. 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 There are 2 adopted children are [name] lives in [facility] [place] [facility] and is currently looking for a job (his will be teaching at [employer] there), and [place] lives in [place] and is a definite card specialist. The patient has no grandchildren.    ADVANCED DIRECTIVES: Not in place; at her 06/26/2014 visit the patient was given the appropriate documents 2 complete and not arise at her discretion.  HEALTH MAINTENANCE: Social History  Substance Use Topics  . Smoking status: Never Smoker  . Smokeless tobacco: Never Used  . Alcohol use No     Colonoscopy: 2008?/[facility]  PAP: Status post hysterectomy  Bone density: 2007/normal  Lipid panel:  Allergies  Allergen Reactions  . Evista [Raloxifene] Other (See Comments)    Made joints hurt "really bad"    Current Outpatient Prescriptions  Medication Sig Dispense Refill  . hydroxypropyl methylcellulose / hypromellose (ISOPTO TEARS / GONIOVISC) 2.5 % ophthalmic solution Place 1 drop into both  eyes 4 (four) times daily.     . irbesartan-hydrochlorothiazide (AVALIDE) 150-12.5 MG tablet Take 1 tablet by mouth daily.    . Multiple Vitamins-Minerals (WOMENS 50+ MULTI VITAMIN/MIN PO) Take 1 tablet by mouth daily.    . RED YEAST RICE EXTRACT PO Take 1 tablet by mouth daily.     . tamoxifen (NOLVADEX) 20 MG tablet Take 1 tablet (20 mg total) by mouth daily. 30 tablet 12  . UNABLE TO FIND 1 tablet daily. Vit B12 B6     No current facility-administered medications for this visit.     OBJECTIVE: Middle-aged white woman  Vitals:   06/10/16 0946  BP: 135/60  Pulse: 85  Resp: 18  Temp: 97.7 F (36.5 C)     Body mass index is 28.21 kg/m.    ECOG FS:0 -  Asymptomatic  Sclerae unicteric, pupils round and equal Oropharynx clear and moist-- no thrush or other lesions No cervical or supraclavicular adenopathy Lungs no rales or rhonchi Heart regular rate and rhythm Abd soft, nontender, positive bowel sounds MSK no focal spinal tenderness, no upper extremity lymphedema Neuro: nonfocal, well oriented, appropriate affect Breasts: status post bilateral lumpectomies. There is no evidence of local recurrence. Both axillae are benign.    CMP     Component Value Date/Time   NA 141 05/15/2014 0800   K 4.3 05/15/2014 0800   CL 106 05/15/2014 0800   CO2 29 05/15/2014 0800   GLUCOSE 127 (H) 05/15/2014 0800   BUN 22 05/15/2014 0800   CREATININE 0.74 05/15/2014 0800   CREATININE 0.76 02/22/2014 0855   CALCIUM 9.1 05/15/2014 0800   PROT 6.5 10/31/2014 0842   ALBUMIN 4.0 10/31/2014 0842   AST 15 10/31/2014 0842   ALT 13 10/31/2014 0842   ALKPHOS 52 10/31/2014 0842   BILITOT 0.3 10/31/2014 0842   GFRNONAA 83 (L) 05/15/2014 0800   GFRAA >90 05/15/2014 0800    INo results found for: SPEP, UPEP  Lab Results  Component Value Date   WBC 7.7 05/15/2014   HGB 13.7 05/15/2014   HCT 42.4 05/15/2014   MCV 89.6 05/15/2014   PLT 190 05/15/2014      Chemistry      Component Value Date/Time   NA 141 05/15/2014 0800   K 4.3 05/15/2014 0800   CL 106 05/15/2014 0800   CO2 29 05/15/2014 0800   BUN 22 05/15/2014 0800   CREATININE 0.74 05/15/2014 0800   CREATININE 0.76 02/22/2014 0855      Component Value Date/Time   CALCIUM 9.1 05/15/2014 0800   ALKPHOS 52 10/31/2014 0842   AST 15 10/31/2014 0842   ALT 13 10/31/2014 0842   BILITOT 0.3 10/31/2014 0842       No results found for: LABCA2  No components found for: [id number]  No results for input(s): INR in the last 168 hours.  Urinalysis No results found for: COLORURINE, APPEARANCEUR, LABSPEC, [place], GLUCOSEU, HGBUR, BILIRUBINUR, [place], PROTEINUR, UROBILINOGEN, NITRITE,  LEUKOCYTESUR  STUDIES: Mm Diag Breast Tomo Bilateral  Result Date: 05/23/2016 CLINICAL DATA:  Status post right lumpectomy for breast carcinoma in January 2016. Status post left lumpectomy for breast carcinoma in August 2002. No current complaints. EXAM: 2D DIGITAL DIAGNOSTIC BILATERAL MAMMOGRAM WITH CAD AND ADJUNCT TOMO COMPARISON:  Previous exam(s). ACR Breast Density Category c: The breast tissue is heterogeneously dense, which may obscure small masses. FINDINGS: Architectural distortion is seen in the upper posterior left breast and in the posterior more central aspect of the right breast reflecting  the bilateral lumpectomy scars. There are no discrete masses or other areas of architectural distortion. There are no new or suspicious calcifications. Mammographic images were processed with CAD. IMPRESSION: No evidence of recurrent or new breast carcinoma. Benign bilateral postsurgical changes. RECOMMENDATION: Diagnostic mammography in 1 year per standard post lumpectomy protocol. I have discussed the findings and recommendations with the patient. Results were also provided in writing at the conclusion of the visit. If applicable, a reminder letter will be sent to the patient regarding the next appointment. BI-RADS CATEGORY  2: Benign. Electronically Signed   By: Lajean Manes M.D.   On: 05/23/2016 11:10    ASSESSMENT: 76 y.o. [place] woman  (1) status post left breast biopsy 11/19/2000 for ductal carcinoma in situ, high-grade followed by reexcision for clear margins 12/18/2000  (a) status post adjuvant radiation to the left breast  (b) received raloxifene for approximately 5 years  (2) status post right breast lower outer quadrant core biopsy 05/01/2014 for ductal carcinoma in situ, high-grade, estrogen receptor 88% positive, progesterone receptor 60% positive  (3)  right breast lumpectomy with additional right medial margin surgery 05/08/2014 showed a 3 cm ductal carcinoma in situ,  high-grade, less than 1 mm from the final medial margin  (4) right breast reexcision 05/15/2014 showed no residual ductal carcinoma in situ  (5) adjuvant radiation 07/05/2014-08/22/2014: Right breast 50.4 Gy, lumpectomy cavity boost 10 Gy  (6) tamoxifen started May 2016  PLAN: Alletta is now 2 years out from definitive surgery for her noninvasive breast cancer with no evidence of disease recurrence. This is very favorable.  She is tolerating tamoxifen remarkably well and the plan is to continue that for a total of 5 years  As far as her diabetes goes she is doing a Fish farm manager job as Editor, commissioning. Today I gave her information on some exercise programs available here. She has done the [place] program in the past and enjoyed it quite a bit.  She knows to call for any problems that may develop before her next visit. Chauncey Cruel, MD   06/10/2016 10:29 AM

## 2016-06-19 DIAGNOSIS — E119 Type 2 diabetes mellitus without complications: Secondary | ICD-10-CM | POA: Diagnosis not present

## 2016-06-19 DIAGNOSIS — H04123 Dry eye syndrome of bilateral lacrimal glands: Secondary | ICD-10-CM | POA: Diagnosis not present

## 2016-06-19 DIAGNOSIS — H35033 Hypertensive retinopathy, bilateral: Secondary | ICD-10-CM | POA: Diagnosis not present

## 2016-06-19 DIAGNOSIS — H353131 Nonexudative age-related macular degeneration, bilateral, early dry stage: Secondary | ICD-10-CM | POA: Diagnosis not present

## 2016-06-19 DIAGNOSIS — H5213 Myopia, bilateral: Secondary | ICD-10-CM | POA: Diagnosis not present

## 2016-06-19 DIAGNOSIS — H52223 Regular astigmatism, bilateral: Secondary | ICD-10-CM | POA: Diagnosis not present

## 2016-06-24 ENCOUNTER — Encounter: Payer: Medicare Other | Attending: Family Medicine | Admitting: Dietician

## 2016-06-24 ENCOUNTER — Encounter: Payer: Self-pay | Admitting: Dietician

## 2016-06-24 DIAGNOSIS — Z713 Dietary counseling and surveillance: Secondary | ICD-10-CM | POA: Diagnosis not present

## 2016-06-24 DIAGNOSIS — E119 Type 2 diabetes mellitus without complications: Secondary | ICD-10-CM | POA: Insufficient documentation

## 2016-06-24 NOTE — Progress Notes (Signed)

## 2016-07-01 ENCOUNTER — Encounter: Payer: Medicare Other | Admitting: Dietician

## 2016-07-01 DIAGNOSIS — Z713 Dietary counseling and surveillance: Secondary | ICD-10-CM | POA: Diagnosis not present

## 2016-07-01 DIAGNOSIS — E119 Type 2 diabetes mellitus without complications: Secondary | ICD-10-CM

## 2016-07-01 NOTE — Progress Notes (Signed)

## 2016-07-08 ENCOUNTER — Encounter: Payer: Medicare Other | Admitting: Dietician

## 2016-07-08 DIAGNOSIS — E119 Type 2 diabetes mellitus without complications: Secondary | ICD-10-CM

## 2016-07-08 DIAGNOSIS — Z713 Dietary counseling and surveillance: Secondary | ICD-10-CM | POA: Diagnosis not present

## 2016-07-08 NOTE — Progress Notes (Signed)
Patient was seen on 07/08/16 for the third of a series of three diabetes self-management courses at [facility].   Debbie Joseph the amount of activity recommended for healthy living . Describe activities suitable for individual needs . Identify ways to regularly incorporate activity into daily life . Identify barriers to activity and ways to over come these barriers  Identify diabetes medications being personally used and their primary action for lowering glucose and possible side effects . Describe role of stress on blood glucose and develop strategies to address psychosocial issues . Identify diabetes complications and ways to prevent them  Explain how to manage diabetes during illness . Evaluate success in meeting personal goal . Establish 2-3 goals that they will plan to diligently work on until they return for the  74-month follow-up visit  Goals:   I will count my carb choices at most meals and snacks  I will be active 30 minutes or more 3 times a week  I will eat less unhealthy fats by eating less butter, bacon, and sausage  Your patient has identified these potential barriers to change:  Motivation  Your patient has identified their diabetes self-care support plan as  Family Support Plan:  Attend Support Group as desired

## 2016-12-01 DIAGNOSIS — C50911 Malignant neoplasm of unspecified site of right female breast: Secondary | ICD-10-CM | POA: Diagnosis not present

## 2016-12-01 DIAGNOSIS — Z7984 Long term (current) use of oral hypoglycemic drugs: Secondary | ICD-10-CM | POA: Diagnosis not present

## 2016-12-01 DIAGNOSIS — E119 Type 2 diabetes mellitus without complications: Secondary | ICD-10-CM | POA: Diagnosis not present

## 2016-12-01 DIAGNOSIS — E7800 Pure hypercholesterolemia, unspecified: Secondary | ICD-10-CM | POA: Diagnosis not present

## 2016-12-01 DIAGNOSIS — I10 Essential (primary) hypertension: Secondary | ICD-10-CM | POA: Diagnosis not present

## 2017-01-09 DIAGNOSIS — Z23 Encounter for immunization: Secondary | ICD-10-CM | POA: Diagnosis not present

## 2017-02-06 ENCOUNTER — Encounter: Payer: Self-pay | Admitting: Cardiovascular Disease

## 2017-02-06 ENCOUNTER — Telehealth: Payer: Self-pay | Admitting: Medical Oncology

## 2017-02-06 ENCOUNTER — Ambulatory Visit (INDEPENDENT_AMBULATORY_CARE_PROVIDER_SITE_OTHER): Payer: Medicare Other | Admitting: Cardiovascular Disease

## 2017-02-06 VITALS — BP 116/64 | HR 89 | Ht 65.0 in | Wt 176.8 lb

## 2017-02-06 DIAGNOSIS — I10 Essential (primary) hypertension: Secondary | ICD-10-CM

## 2017-02-06 NOTE — Progress Notes (Signed)
02/06/2017 Debbie Joseph   [birth year]  [id number]  Primary Physician Shirline Frees, MD Primary Cardiologist: Lorretta Harp MD FACP, [place], [place], [place]  HPI:  Debbie Joseph is a 76 y.o. female moderately overweight married Caucasian female mother of 2 children who I last saw in the office 12/25/15.[name] She was referred by Dr. Kenton Kingfisher for cardiovascular evaluation because of poorly controlled blood pressure and increasing dyspnea on exertion. She has a history of blood pressure for years. She does not watch her salt intake. There are no other cardiovascular risk factors. She does not have a family history of heart disease. She has never had a heart attack or stroke. She denies chest pain but does relate a history of increasing dyspnea on exertion over the last 12 months. When Dr. Kenton Kingfisher doubled her  losartan from 50-100 mg her blood pressure was better controlled although she was symptomatic from this. I saw her back 12/08/13 I performed a 2-D echo that was entirely normal and renal Doppler studies that ruled out renal vascular disease. Since I saw her back in October she has developed recurrent breast cancer and has had surgery and radiation therapy. She also joined the [facility] and has been exercising fairly regularly and watching her diet. She feels clinically improved since I saw her last. She denies chest pain but does get some dyspnea on exertion. She was recently diagnosed with diabetes however. She watches her diet and serum glucose closely.   Current Meds  Medication Sig  . Digestive Enzymes (MULTI-ENZYME PO) Take by mouth.  . hydroxypropyl methylcellulose / hypromellose (ISOPTO TEARS / GONIOVISC) 2.5 % ophthalmic solution Place 1 drop into both eyes 4 (four) times daily.   . irbesartan-hydrochlorothiazide (AVALIDE) 150-12.5 MG tablet Take 1 tablet by mouth daily.  . Multiple Vitamins-Minerals (WOMENS 50+ MULTI VITAMIN/MIN PO) Take 1 tablet by mouth daily.  . RED YEAST RICE EXTRACT PO Take 1  tablet by mouth daily.   . tamoxifen (NOLVADEX) 20 MG tablet Take 1 tablet (20 mg total) by mouth daily.  [name] UNABLE TO FIND 1 tablet daily. Vit B12 B6     Allergies  Allergen Reactions  . Evista [Raloxifene] Other (See Comments)    Made joints hurt "really bad"    Social History   Social History  . Marital status: Married    Spouse name: N/A  . Number of children: 2  . Years of education: N/A   Occupational History  . teacher    Social History Main Topics  . Smoking status: Never Smoker  . Smokeless tobacco: Never Used  . Alcohol use No  . Drug use: No  . Sexual activity: Not on file   Other Topics Concern  . Not on file   Social History Narrative  . No narrative on file     Review of Systems: General: negative for chills, fever, night sweats or weight changes.  Cardiovascular: negative for chest pain, dyspnea on exertion, edema, orthopnea, palpitations, paroxysmal nocturnal dyspnea or shortness of breath Dermatological: negative for rash Respiratory: negative for cough or wheezing Urologic: negative for hematuria Abdominal: negative for nausea, vomiting, diarrhea, bright red blood per rectum, melena, or hematemesis Neurologic: negative for visual changes, syncope, or dizziness All other systems reviewed and are otherwise negative except as noted above.    Blood pressure 116/64, pulse 89, height 5\' 5"  (1.651 m), weight 176 lb 12.8 oz (80.2 kg).  General appearance: alert and no distress Neck: no adenopathy, no carotid bruit,  no JVD, supple, symmetrical, trachea midline and thyroid not enlarged, symmetric, no tenderness/mass/nodules Lungs: clear to auscultation bilaterally Heart: regular rate and rhythm, S1, S2 normal, no murmur, click, rub or gallop Extremities: extremities normal, atraumatic, no cyanosis or edema Pulses: 2+ and symmetric Skin: Skin color, texture, turgor normal. No rashes or lesions Neurologic: Alert and oriented X 3, normal strength and tone.  Normal symmetric reflexes. Normal coordination and gait  EKG sinus rhythm 89 without ST or T-wave changes. I personally reviewed this EKG.  ASSESSMENT AND PLAN:   Essential hypertension History of essential hypertension blood pressure measurements at 116/64. She is on irbesartan and hydrochlorothiazide. Continue current meds Lorretta Harp MD FACP,FACC,FAHA, [facility] 02/06/2017 2:30 PM

## 2017-02-06 NOTE — Assessment & Plan Note (Signed)
History of essential hypertension blood pressure measurements at 116/64. She is on irbesartan and hydrochlorothiazide. Continue current meds (

## 2017-02-06 NOTE — Telephone Encounter (Signed)
Pt cancelled 06/09/17 appt . She will be oot 2/19-06/24/17. She would like to re-schedule it for 06/02/17. Schedule request sent.

## 2017-02-06 NOTE — Patient Instructions (Signed)

## 2017-03-19 DIAGNOSIS — D0511 Intraductal carcinoma in situ of right breast: Secondary | ICD-10-CM | POA: Diagnosis not present

## 2017-04-15 ENCOUNTER — Other Ambulatory Visit: Payer: Self-pay | Admitting: Family Medicine

## 2017-04-15 DIAGNOSIS — Z139 Encounter for screening, unspecified: Secondary | ICD-10-CM

## 2017-04-16 ENCOUNTER — Other Ambulatory Visit: Payer: Self-pay | Admitting: Family Medicine

## 2017-04-16 DIAGNOSIS — Z853 Personal history of malignant neoplasm of breast: Secondary | ICD-10-CM

## 2017-05-25 ENCOUNTER — Ambulatory Visit
Admission: RE | Admit: 2017-05-25 | Discharge: 2017-05-25 | Disposition: A | Discharge status: Home / Self Care | Payer: Medicare Other | Source: Ambulatory Visit | Attending: Family Medicine | Admitting: Family Medicine

## 2017-05-25 DIAGNOSIS — R922 Inconclusive mammogram: Secondary | ICD-10-CM | POA: Diagnosis not present

## 2017-05-25 DIAGNOSIS — Z853 Personal history of malignant neoplasm of breast: Secondary | ICD-10-CM

## 2017-05-25 HISTORY — DX: Personal history of irradiation: Z92.3

## 2017-06-01 NOTE — Progress Notes (Signed)
[facility]  Telephone:([phone]) [phone] Fax:(336) [phone]     ID: Debbie Joseph DOB: [birth year]  MR#: [id number]  EQA#:[id number]  Patient Care Team: Debbie Frees, MD as PCP - General (Family Medicine) PCP: Debbie Frees, MD GYN: SU: Excell Seltzer  OTHER MD: Debbie Joseph M.D., Debbie Joseph M.D.  CHIEF COMPLAINT: Noninvasive breast cancer  CURRENT TREATMENT: tamoxifen   BREAST CANCER HISTORY: From the original intake note:   Debbie Joseph was first diagnosed with breast cancer in 11/19/2000, when she underwent biopsy of a left breast ductal carcinoma in situ under Debbie Joseph. She underwent a fuller excision 12/18/2000 and achieve clear margins, although the closest margin remained at 1 mm. She then received radiation therapy and took raloxifene/Evista for approximately 5 years. She discontinued the medication partly because of cost and partly because of concerns regarding arthralgias and myalgias, which however most likely were not related.  She then did fine until 04/17/2014 where bilateral screening mammography with tomography at the breast Center showed new calcifications in the right breast. On 05/01/2014 she underwent right diagnostic mammography which found the breast density to be category C. There were fine pleomorphic calcifications spanning approximately 5 cm in the lower outer quadrant of the right breast. There was no associated mass.  On the same day needle core biopsy was obtained and showed ([id number]) ductal carcinoma in situ, high-grade, estrogen receptor 88% positive, with strong staining intensity; progesterone receptor 60% positive, with moderate staining intensity.  On 05/04/2014 the patient underwent bilateral breast MRI, which showed no findings of interest in the left breast and no abnormal appearing lymph nodes. In the right breast lower outer quadrant there was a 4.57 m area of clumped nodular enhancement. This was felt to correspond closely to the  extent of mammographic calcifications.  Accordingly on 05/08/2014 the patient underwent right lumpectomy with additional right margin resection. The pathology from this procedure ([id number]) showed ductal carcinoma in situ, high-grade, with no invasive component. It measured 3 cm in greatest dimension. There was an additional centimeter of disease associated with the right medial margin portion, but even so margins remained extremely close at less than a millimeter.  Accordingly on 05/15/2014 the patient underwent further surgery for margin clearance. Excision of the right medial and right superior margins found no residual carcinoma.  The patient's subsequent history is as detailed below  INTERVAL HISTORY: Debbie Joseph returns today for follow-up of her ductal carcinoma in situ. She continues on tamoxifen, with good tolerance. She notes that hot flashes used to be a problem for her, but it is no longer an issue for her. She notes that she has notice a small increase in vaginal wetness.   Since her last visit, she underwent diagnostic bilateral mammography with CAD and tomography on 05/25/2017 at [facility] showing: breast density category C. There was no evidence of malignancy.    REVIEW OF SYSTEMS: Debbie Joseph reports that she went to [place] and [place] in June 2018 for [place] trips. 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 She notes that she is currently not exercising regularly. She denies unusual headaches, visual changes, nausea, vomiting, or dizziness. There has been no unusual cough, phlegm production, or pleurisy. This been no change in bowel or bladder habits. She denies unexplained fatigue or unexplained weight loss, bleeding, rash, or fever.  She is not exercising regularly.  A detailed review of systems was otherwise stable.    PAST MEDICAL HISTORY: Past Medical History:  Diagnosis Date  . Breast cancer ([place])    left breast  . Breast cancer, right  breast ([place])   . Diabetes mellitus without complication ([place])   . Dyspnea on exertion   . Family history of adverse reaction to anesthesia    maternal aunt- slow to wake up   . Family history of breast cancer   . GERD (gastroesophageal reflux disease)   . Headache    hx of migraines greater than 20 years ago   . Hypertension   . Personal history of radiation therapy   . Radiation 07/04/14-08/22/14   right breast 50.4 Gy, lumpectomy cavity boosted to 10 Gy    PAST SURGICAL HISTORY: Past Surgical History:  Procedure Laterality Date  . APPENDECTOMY  1974   with hernia  . BREAST LUMPECTOMY WITH NEEDLE LOCALIZATION Right 05/08/2014   Procedure: BREAST LUMPECTOMY WITH NEEDLE LOCALIZATION;  Surgeon: Excell Seltzer, MD;  Location: [facility];  Service: General;  Laterality: Right;  . BREAST SURGERY  2002   lt breast lump-cancer  . CATARACT EXTRACTION Bilateral 2009  . COLONOSCOPY    . dental inplant  2015  . PARTIAL HYSTERECTOMY  1983  . RE-EXCISION OF BREAST LUMPECTOMY Right 05/15/2014   Procedure: RE-EXCISION OF BREAST LUMPECTOMY SUPERIOR AND MEDIAL MARGINS;  Surgeon: Excell Seltzer, MD;  Location: [facility];  Service: General;  Laterality: Right;  . UMBILICAL HERNIA REPAIR  1974    FAMILY HISTORY Family History  Problem Relation Age of Onset  . Stroke Mother 1  . Hypertension Mother   . Lung cancer Father 34  . Stroke Maternal Grandfather 60  . Hypertension Maternal Grandfather   . Tuberculosis Paternal Grandmother   . Hypertension Brother   . Diabetes Brother   . Urolithiasis Brother   . Leukemia Sister 10       AML  . Diabetes Sister   . Hypertension Maternal Aunt   . Breast cancer Maternal Aunt 58  . Hypertension Maternal Uncle   . Colon cancer Maternal Grandmother        dx in her 79s   the patient's father died from lung cancer at the age of 50, in the setting of tobacco abuse. The patient's mother died at the age of 49. The patient's mother only  sister was diagnosed with breast cancer at the age of 30. Debbie Joseph herself has 3 brothers and 2 sisters. One of her sisters was diagnosed with acute myeloid leukemia at the age of 48. She survives. A maternal grandmother may have had colon or other cancer, diagnosed in her 71s. There is no other history of cancer in the family to the patient's knowledge  GYNECOLOGIC HISTORY:  No LMP recorded. Patient has had a hysterectomy. Menarche age 27 or the patient is GX P0. She took hormone replacement for approximately 15 years, and fertility treatments again very briefly. 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 She and her husband do quite a bit of [place] work. 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 There are 2 adopted children are [name] lives in [facility] [place] [place] and is currently looking for a job (his will be teaching at [employer] there), and Prattville lives in [place] and is a definite card specialist. The patient has no grandchildren.    ADVANCED DIRECTIVES: Not in place; at her 06/26/2014  visit the patient was given the appropriate documents 2 complete and not arise at her discretion.  HEALTH MAINTENANCE: Social History   Tobacco Use  . Smoking status: Never Smoker  . Smokeless tobacco: Never Used  Substance Use Topics  . Alcohol use: No  . Drug use: No     Colonoscopy: 2008?/[facility]  PAP: Status post hysterectomy  Bone density: 2007/normal  Lipid panel:  Allergies  Allergen Reactions  . Evista [Raloxifene] Other (See Comments)    Made joints hurt "really bad"    Current Outpatient Medications  Medication Sig Dispense Refill  . Digestive Enzymes (MULTI-ENZYME PO) Take by mouth.    . hydroxypropyl methylcellulose / hypromellose (ISOPTO TEARS / GONIOVISC) 2.5 % ophthalmic solution Place 1 drop into  both eyes 4 (four) times daily.     . irbesartan-hydrochlorothiazide (AVALIDE) 150-12.5 MG tablet Take 1 tablet by mouth daily.    . Multiple Vitamins-Minerals (WOMENS 50+ MULTI VITAMIN/MIN PO) Take 1 tablet by mouth daily.    . RED YEAST RICE EXTRACT PO Take 1 tablet by mouth daily.     . tamoxifen (NOLVADEX) 20 MG tablet Take 1 tablet (20 mg total) by mouth daily. 30 tablet 12  . UNABLE TO FIND 1 tablet daily. Vit B12 B6     No current facility-administered medications for this visit.     OBJECTIVE: Middle-aged white woman in no acute distress  Vitals:   06/02/17 1050  BP: 135/63  Pulse: 79  Resp: 18  Temp: 98.1 F (36.7 C)  SpO2: 97%     Body mass index is 29.77 kg/m.    ECOG FS:0 - Asymptomatic  Sclerae unicteric, EOMs intact Oropharynx clear and moist No cervical or supraclavicular adenopathy Lungs no rales or rhonchi Heart regular rate and rhythm Abd soft, nontender, positive bowel sounds MSK no focal spinal tenderness, no upper extremity lymphedema Neuro: nonfocal, well oriented, appropriate affect Breasts: She has had lumpectomies on both sides.  She also received radiation bilateral there is no evidence of local recurrence.   CMP     Component Value Date/Time   NA 141 05/15/2014 0800   K 4.3 05/15/2014 0800   CL 106 05/15/2014 0800   CO2 29 05/15/2014 0800   GLUCOSE 127 (H) 05/15/2014 0800   BUN 22 05/15/2014 0800   CREATININE 0.74 05/15/2014 0800   CREATININE 0.76 02/22/2014 0855   CALCIUM 9.1 05/15/2014 0800   PROT 6.5 10/31/2014 0842   ALBUMIN 4.0 10/31/2014 0842   AST 15 10/31/2014 0842   ALT 13 10/31/2014 0842   ALKPHOS 52 10/31/2014 0842   BILITOT 0.3 10/31/2014 0842   GFRNONAA 83 (L) 05/15/2014 0800   GFRAA >90 05/15/2014 0800    INo results found for: SPEP, UPEP  Lab Results  Component Value Date   WBC 7.7 05/15/2014   HGB 13.7 05/15/2014   HCT 42.4 05/15/2014   MCV 89.6 05/15/2014   PLT 190 05/15/2014      Chemistry      Component  Value Date/Time   NA 141 05/15/2014 0800   K 4.3 05/15/2014 0800   CL 106 05/15/2014 0800   CO2 29 05/15/2014 0800   BUN 22 05/15/2014 0800   CREATININE 0.74 05/15/2014 0800   CREATININE 0.76 02/22/2014 0855      Component Value Date/Time   CALCIUM 9.1 05/15/2014 0800   ALKPHOS 52 10/31/2014 0842   AST 15 10/31/2014 0842   ALT 13 10/31/2014 0842   BILITOT 0.3 10/31/2014 5093  No results found for: LABCA2  No components found for: ACZYS063  No results for input(s): INR in the last 168 hours.  Urinalysis No results found for: COLORURINE, APPEARANCEUR, LABSPEC, PHURINE, GLUCOSEU, HGBUR, BILIRUBINUR, KETONESUR, PROTEINUR, UROBILINOGEN, NITRITE, LEUKOCYTESUR  STUDIES: Mm Diag Breast Tomo Bilateral  Result Date: 05/25/2017 CLINICAL DATA:  77 year old female status post malignant right lumpectomy with radiation in 2016 and malignant left lumpectomy with radiation in 2002. No current symptoms. EXAM: 2D DIGITAL DIAGNOSTIC BILATERAL MAMMOGRAM WITH CAD AND ADJUNCT TOMO COMPARISON:  Previous exam(s). ACR Breast Density Category c: The breast tissue is heterogeneously dense, which may obscure small masses. FINDINGS: Stable post lumpectomy changes are demonstrated in the bilateral breasts. No suspicious mammographic findings are identified. The parenchymal pattern is stable. Mammographic images were processed with CAD. IMPRESSION: 1. No mammographic evidence of malignancy in either breast. 2. Stable posttreatment changes bilaterally. RECOMMENDATION: Diagnostic mammogram is suggested in 1 year. (Code:[id number]-B-01Y) I have discussed the findings and recommendations with the patient. Results were also provided in writing at the conclusion of the visit. If applicable, a reminder letter will be sent to the patient regarding the next appointment. BI-RADS CATEGORY  2: Benign. Electronically Signed   By: Kristopher Oppenheim M.D.   On: 05/25/2017 10:34    ASSESSMENT: 77 y.o. [place] woman  (1) status  post left breast biopsy 11/19/2000 for ductal carcinoma in situ, high-grade followed by reexcision for clear margins 12/18/2000  (a) status post adjuvant radiation to the left breast  (b) received raloxifene for approximately 5 years  (2) status post right breast lower outer quadrant core biopsy 05/01/2014 for ductal carcinoma in situ, high-grade, estrogen receptor 88% positive, progesterone receptor 60% positive  (3)  right breast lumpectomy with additional right medial margin surgery 05/08/2014 showed a 3 cm ductal carcinoma in situ, high-grade, less than 1 mm from the final medial margin  (4) right breast reexcision 05/15/2014 showed no residual ductal carcinoma in situ  (5) adjuvant radiation 07/05/2014-08/22/2014: Right breast 50.4 Gy, lumpectomy cavity boost 10 Gy  (6) tamoxifen started May 2016.  PLAN: Santos is now 3 years out from her most recent breast cancer surgery with no evidence of disease recurrence.  This is very favorable.  She is tolerating the tamoxifen excellently and the plan is to continue that for a total of 5 years.  She understands her breasts are still on the dense side and so she does need to continue to obtain [employer] yearly.  She has some chest wall spasms on the right and some limitation of motion of the left.  If she really wants to improve those symptoms she will have to do some stretching exercises.  For now I suggested that she apply pressure to the right chest wall if she does get a spasm there.  Fortunately these brief but uncomfortable symptoms are infrequent  I encouraged her to start a regular walking program  She will see me again in 1 year.  She knows to call for any other issues that may develop before then   Laniqua Torrens, Virgie Dad, MD  06/02/17 11:02 AM Medical Oncology and Hematology [facility] [place], [place] [address] Tel. [phone]    Fax. [phone]  This document serves as a record of services personally  performed by Lurline Del, MD. It was created on his behalf by Sheron Nightingale, a trained medical scribe. The creation of this record is based on the scribe's personal observations and the provider's statements to them.  I have reviewed the above documentation for accuracy and completeness, and I agree with the above.

## 2017-06-02 ENCOUNTER — Inpatient Hospital Stay: Payer: Medicare Other | Attending: Oncology | Admitting: Oncology

## 2017-06-02 ENCOUNTER — Telehealth: Payer: Self-pay | Admitting: Oncology

## 2017-06-02 VITALS — BP 135/63 | HR 79 | Temp 98.1°F | Resp 18 | Ht 65.0 in | Wt 178.9 lb

## 2017-06-02 DIAGNOSIS — Z79810 Long term (current) use of selective estrogen receptor modulators (SERMs): Secondary | ICD-10-CM | POA: Insufficient documentation

## 2017-06-02 DIAGNOSIS — Z170 Estrogen receptor positive status [ER+]: Secondary | ICD-10-CM | POA: Diagnosis not present

## 2017-06-02 DIAGNOSIS — Z86000 Personal history of in-situ neoplasm of breast: Secondary | ICD-10-CM | POA: Diagnosis not present

## 2017-06-02 DIAGNOSIS — R252 Cramp and spasm: Secondary | ICD-10-CM | POA: Diagnosis not present

## 2017-06-02 DIAGNOSIS — C50511 Malignant neoplasm of lower-outer quadrant of right female breast: Secondary | ICD-10-CM

## 2017-06-02 DIAGNOSIS — D0511 Intraductal carcinoma in situ of right breast: Secondary | ICD-10-CM | POA: Diagnosis not present

## 2017-06-02 NOTE — Telephone Encounter (Signed)
Gave patient AVs and calendar of upcoming February 2020 appointments. °

## 2017-06-03 DIAGNOSIS — I10 Essential (primary) hypertension: Secondary | ICD-10-CM | POA: Diagnosis not present

## 2017-06-03 DIAGNOSIS — E119 Type 2 diabetes mellitus without complications: Secondary | ICD-10-CM | POA: Diagnosis not present

## 2017-06-03 DIAGNOSIS — C50911 Malignant neoplasm of unspecified site of right female breast: Secondary | ICD-10-CM | POA: Diagnosis not present

## 2017-06-03 DIAGNOSIS — Z1389 Encounter for screening for other disorder: Secondary | ICD-10-CM | POA: Diagnosis not present

## 2017-06-03 DIAGNOSIS — E7800 Pure hypercholesterolemia, unspecified: Secondary | ICD-10-CM | POA: Diagnosis not present

## 2017-06-09 ENCOUNTER — Ambulatory Visit: Payer: Medicare Other | Admitting: Oncology

## 2017-06-25 DIAGNOSIS — H353131 Nonexudative age-related macular degeneration, bilateral, early dry stage: Secondary | ICD-10-CM | POA: Diagnosis not present

## 2017-06-25 DIAGNOSIS — H35033 Hypertensive retinopathy, bilateral: Secondary | ICD-10-CM | POA: Diagnosis not present

## 2017-06-25 DIAGNOSIS — H04123 Dry eye syndrome of bilateral lacrimal glands: Secondary | ICD-10-CM | POA: Diagnosis not present

## 2017-06-25 DIAGNOSIS — H5213 Myopia, bilateral: Secondary | ICD-10-CM | POA: Diagnosis not present

## 2017-06-25 DIAGNOSIS — Z961 Presence of intraocular lens: Secondary | ICD-10-CM | POA: Diagnosis not present

## 2017-06-25 DIAGNOSIS — H52223 Regular astigmatism, bilateral: Secondary | ICD-10-CM | POA: Diagnosis not present

## 2017-07-03 DIAGNOSIS — Z1211 Encounter for screening for malignant neoplasm of colon: Secondary | ICD-10-CM | POA: Diagnosis not present

## 2017-07-03 DIAGNOSIS — D123 Benign neoplasm of transverse colon: Secondary | ICD-10-CM | POA: Diagnosis not present

## 2017-07-07 DIAGNOSIS — D123 Benign neoplasm of transverse colon: Secondary | ICD-10-CM | POA: Diagnosis not present

## 2017-07-07 DIAGNOSIS — Z1211 Encounter for screening for malignant neoplasm of colon: Secondary | ICD-10-CM | POA: Diagnosis not present

## 2017-07-08 ENCOUNTER — Telehealth: Payer: Self-pay | Admitting: *Deleted

## 2017-07-08 MED ORDER — TAMOXIFEN CITRATE 20 MG PO TABS: 20.0000 mg | ORAL_TABLET | Freq: Every day | ORAL | 12 refills | Status: DC

## 2017-07-08 NOTE — Telephone Encounter (Signed)
Pt called for a refill on her Tamoxifen & wants to change pharmacy to [facility] on [address]. Refill sent.

## 2017-08-27 DIAGNOSIS — X32XXXA Exposure to sunlight, initial encounter: Secondary | ICD-10-CM | POA: Diagnosis not present

## 2017-08-27 DIAGNOSIS — L570 Actinic keratosis: Secondary | ICD-10-CM | POA: Diagnosis not present

## 2017-09-28 ENCOUNTER — Telehealth: Payer: Self-pay | Admitting: Cardiovascular Disease

## 2017-09-28 NOTE — Telephone Encounter (Signed)
Spoke with pt, last week she decided to check her bp and her bp was fine. She checked it the next day and her pulse was 107. Since then she has noticed her pulse will be elevated as high as 110 bpm per her bp machine. She reports when getting up to do anything her heart rate will elevate and she will get SOB. She will have occ dizziness. She is not able to feel her pulse for irregularities. She reports problems with remembering parts of stories and names but has had this problem for sometime, last week it seemed to be worse. Today everything is normal with bp and pulse and she feels better. Follow up scheduled with dr berry next week. She will track her bp and pulse and bring that to the appointment.

## 2017-09-28 NOTE — Telephone Encounter (Signed)
Pt c/o BP issue: STAT if pt c/o blurred vision, one-sided weakness or slurred speech  1. What are your last 5 BP readings? Blood Pressure have been going up and down-this morning it was 132/72 and pulse  Was 70 ,yesterday, 14171 and pulse was 90, Saturday was 133/66 and pulse was 100, Thursday was the worse, it was  142/74 and pulse was 107   2. Are you having any other symptoms (ex. Dizziness, headache, blurred vision, passed out)? Short of breath and feels like a band is around her head,problems get what she wants to say,no slurring,just getting her thoughts together to get it out  3. What is your BP issue? It is up and down and down- high pulse rate

## 2017-10-06 ENCOUNTER — Encounter: Payer: Self-pay | Admitting: Cardiovascular Disease

## 2017-10-06 ENCOUNTER — Ambulatory Visit (INDEPENDENT_AMBULATORY_CARE_PROVIDER_SITE_OTHER): Payer: Medicare Other | Admitting: Cardiovascular Disease

## 2017-10-06 DIAGNOSIS — R0609 Other forms of dyspnea: Secondary | ICD-10-CM | POA: Diagnosis not present

## 2017-10-06 DIAGNOSIS — I10 Essential (primary) hypertension: Secondary | ICD-10-CM | POA: Diagnosis not present

## 2017-10-06 NOTE — Patient Instructions (Addendum)
Medication Instructions: Your physician recommends that you continue on your current medications as directed. Please refer to the Current Medication list given to you today.  Testing: Your physician has requested that you have a cardiac CT (Calcium Score). Cardiac computed tomography (CT) is a painless test that uses an x-ray machine to take clear, detailed pictures of your heart. For further information please visit [facility]. Please follow instruction sheet as given.  Follow-Up: Your physician wants you to follow-up in: 1 year with Dr. Gwenlyn Found. You will receive a reminder letter in the mail two months in advance. If you don't receive a letter, please call our office to schedule the follow-up appointment.  If you need a refill on your cardiac medications before your next appointment, please call your pharmacy.

## 2017-10-06 NOTE — Assessment & Plan Note (Signed)
History of dyspnea on exertion with a normal 2D echo.  I am going to get a coronary calcium score on her to rule out coronary artery disease.

## 2017-10-06 NOTE — Assessment & Plan Note (Signed)
History of essential hypertension her blood pressure measured today at 138/69.  She is on Avalide for her blood pressure readings at home are similar to this or lower.  Continue current meds at current dosing.

## 2017-10-06 NOTE — Progress Notes (Signed)
10/06/2017 Debbie Joseph   08-29-1940  [id number]  Primary Physician Shirline Frees, MD Primary Cardiologist: Lorretta Harp MD FACP, [place], [place], [place]  HPI:  Debbie Joseph is a 77 y.o.  moderately overweight married Caucasian female mother of 2 children who I last saw in the office  02/06/2017.[name] She was referred by Dr. Kenton Kingfisher for cardiovascular evaluation because of poorly controlled blood pressure and increasing dyspnea on exertion. She has a history of blood pressure for years. She does not watch her salt intake. There are no other cardiovascular risk factors. She does not have a family history of heart disease. She has never had a heart attack or stroke. She denies chest pain but does relate a history of increasing dyspnea on exertion over the last 12 months. When Dr. Kenton Kingfisher doubled her  losartan from 50-100 mg her blood pressure was better controlled although she was symptomatic from this. I saw her back 12/08/13 I performed a 2-D echo that was entirely normal and renal Doppler studies that ruled out renal vascular disease. Since I saw her back in October she has developed recurrent breast cancer and has had surgery and radiation therapy. She also joined the [facility] and has been exercising fairly regularly and watching her diet. She feels clinically improved since I saw her last. She denies chest pain but does get some dyspnea on exertion. She was recently diagnosed with diabetes however. She watches her diet and serum glucose closely. Since I saw her 9 months ago she her blood pressures been under adequate control.  Her major complaints are of dyspnea for unclear reasons.     Current Meds  Medication Sig  . Digestive Enzymes (MULTI-ENZYME PO) Take by mouth.  . hydroxypropyl methylcellulose / hypromellose (ISOPTO TEARS / GONIOVISC) 2.5 % ophthalmic solution Place 1 drop into both eyes 4 (four) times daily.   . irbesartan-hydrochlorothiazide (AVALIDE) 150-12.5 MG tablet Take 1 tablet by mouth  daily.  . Multiple Vitamins-Minerals (WOMENS 50+ MULTI VITAMIN/MIN PO) Take 1 tablet by mouth daily.  . RED YEAST RICE EXTRACT PO Take 1 tablet by mouth daily.   . tamoxifen (NOLVADEX) 20 MG tablet Take 1 tablet (20 mg total) by mouth daily.  [name] UNABLE TO FIND 1 tablet daily. Vit B12 B6     Allergies  Allergen Reactions  . Evista [Raloxifene] Other (See Comments)    Made joints hurt "really bad"    Social History   Socioeconomic History  . Marital status: Married    Spouse name: Not on file  . Number of children: 2  . Years of education: Not on file  . Highest education level: Not on file  Occupational History  . Occupation: Pharmacist, hospital  Social Needs  . Financial resource strain: Not on file  . Food insecurity:    Worry: Not on file    Inability: Not on file  . Transportation needs:    Medical: Not on file    Non-medical: Not on file  Tobacco Use  . Smoking status: Never Smoker  . Smokeless tobacco: Never Used  Substance and Sexual Activity  . Alcohol use: No  . Drug use: No  . Sexual activity: Not on file  Lifestyle  . Physical activity:    Days per week: Not on file    Minutes per session: Not on file  . Stress: Not on file  Relationships  . Social connections:    Talks on phone: Not on file    Gets together:  Not on file    Attends religious service: Not on file    Active member of club or organization: Not on file    Attends meetings of clubs or organizations: Not on file    Relationship status: Not on file  . Intimate partner violence:    Fear of current or ex partner: Not on file    Emotionally abused: Not on file    Physically abused: Not on file    Forced sexual activity: Not on file  Other Topics Concern  . Not on file  Social History Narrative  . Not on file     Review of Systems: General: negative for chills, fever, night sweats or weight changes.  Cardiovascular: negative for chest pain, dyspnea on exertion, edema, orthopnea, palpitations,  paroxysmal nocturnal dyspnea or shortness of breath Dermatological: negative for rash Respiratory: negative for cough or wheezing Urologic: negative for hematuria Abdominal: negative for nausea, vomiting, diarrhea, bright red blood per rectum, melena, or hematemesis Neurologic: negative for visual changes, syncope, or dizziness All other systems reviewed and are otherwise negative except as noted above.    Blood pressure 138/69, pulse 89, height 5\' 5"  (1.651 m), weight 182 lb 3.2 oz (82.6 kg).  General appearance: alert and no distress Neck: no adenopathy, no carotid bruit, no JVD, supple, symmetrical, trachea midline and thyroid not enlarged, symmetric, no tenderness/mass/nodules Lungs: clear to auscultation bilaterally Heart: regular rate and rhythm, S1, S2 normal, no murmur, click, rub or gallop Extremities: extremities normal, atraumatic, no cyanosis or edema Pulses: 2+ and symmetric Skin: Skin color, texture, turgor normal. No rashes or lesions Neurologic: Alert and oriented X 3, normal strength and tone. Normal symmetric reflexes. Normal coordination and gait  EKG not performed today  ASSESSMENT AND PLAN:   Essential hypertension History of essential hypertension her blood pressure measured today at 138/69.  She is on Avalide for her blood pressure readings at home are similar to this or lower.  Continue current meds at current dosing.  Dyspnea on exertion History of dyspnea on exertion with a normal 2D echo.  I am going to get a coronary calcium score on her to rule out coronary artery disease.      Lorretta Harp MD FACP,FACC,FAHA, [facility] 10/06/2017 1:46 PM

## 2017-10-14 ENCOUNTER — Ambulatory Visit (INDEPENDENT_AMBULATORY_CARE_PROVIDER_SITE_OTHER)
Admission: RE | Admit: 2017-10-14 | Discharge: 2017-10-14 | Disposition: A | Discharge status: Home / Self Care | Payer: Self-pay | Source: Ambulatory Visit | Attending: Cardiovascular Disease | Admitting: Cardiovascular Disease

## 2017-10-14 DIAGNOSIS — R0609 Other forms of dyspnea: Secondary | ICD-10-CM

## 2017-10-20 DIAGNOSIS — L570 Actinic keratosis: Secondary | ICD-10-CM | POA: Diagnosis not present

## 2017-10-20 DIAGNOSIS — X32XXXD Exposure to sunlight, subsequent encounter: Secondary | ICD-10-CM | POA: Diagnosis not present

## 2017-11-10 DIAGNOSIS — L570 Actinic keratosis: Secondary | ICD-10-CM | POA: Diagnosis not present

## 2017-11-10 DIAGNOSIS — X32XXXD Exposure to sunlight, subsequent encounter: Secondary | ICD-10-CM | POA: Diagnosis not present

## 2017-12-01 DIAGNOSIS — E7800 Pure hypercholesterolemia, unspecified: Secondary | ICD-10-CM | POA: Diagnosis not present

## 2017-12-01 DIAGNOSIS — C50911 Malignant neoplasm of unspecified site of right female breast: Secondary | ICD-10-CM | POA: Diagnosis not present

## 2017-12-01 DIAGNOSIS — I10 Essential (primary) hypertension: Secondary | ICD-10-CM | POA: Diagnosis not present

## 2017-12-01 DIAGNOSIS — R5383 Other fatigue: Secondary | ICD-10-CM | POA: Diagnosis not present

## 2017-12-01 DIAGNOSIS — E119 Type 2 diabetes mellitus without complications: Secondary | ICD-10-CM | POA: Diagnosis not present

## 2017-12-01 DIAGNOSIS — R918 Other nonspecific abnormal finding of lung field: Secondary | ICD-10-CM | POA: Diagnosis not present

## 2018-01-07 DIAGNOSIS — Z23 Encounter for immunization: Secondary | ICD-10-CM | POA: Diagnosis not present

## 2018-02-26 DIAGNOSIS — L568 Other specified acute skin changes due to ultraviolet radiation: Secondary | ICD-10-CM | POA: Diagnosis not present

## 2018-02-26 DIAGNOSIS — L821 Other seborrheic keratosis: Secondary | ICD-10-CM | POA: Diagnosis not present

## 2018-02-26 DIAGNOSIS — L570 Actinic keratosis: Secondary | ICD-10-CM | POA: Diagnosis not present

## 2018-02-26 DIAGNOSIS — X32XXXD Exposure to sunlight, subsequent encounter: Secondary | ICD-10-CM | POA: Diagnosis not present

## 2018-04-19 ENCOUNTER — Other Ambulatory Visit: Payer: Self-pay | Admitting: Family Medicine

## 2018-04-19 DIAGNOSIS — Z9889 Other specified postprocedural states: Secondary | ICD-10-CM

## 2018-04-28 DIAGNOSIS — D0511 Intraductal carcinoma in situ of right breast: Secondary | ICD-10-CM | POA: Diagnosis not present

## 2018-05-06 ENCOUNTER — Other Ambulatory Visit: Payer: Self-pay | Admitting: Family Medicine

## 2018-05-06 DIAGNOSIS — R918 Other nonspecific abnormal finding of lung field: Secondary | ICD-10-CM

## 2018-05-12 ENCOUNTER — Ambulatory Visit
Admission: RE | Admit: 2018-05-12 | Discharge: 2018-05-12 | Disposition: A | Discharge status: Home / Self Care | Payer: Medicare Other | Source: Ambulatory Visit | Attending: Family Medicine | Admitting: Family Medicine

## 2018-05-12 DIAGNOSIS — R918 Other nonspecific abnormal finding of lung field: Secondary | ICD-10-CM

## 2018-05-26 ENCOUNTER — Ambulatory Visit
Admission: RE | Admit: 2018-05-26 | Discharge: 2018-05-26 | Disposition: A | Discharge status: Home / Self Care | Payer: Medicare Other | Source: Ambulatory Visit | Attending: Family Medicine | Admitting: Family Medicine

## 2018-05-26 DIAGNOSIS — R922 Inconclusive mammogram: Secondary | ICD-10-CM | POA: Diagnosis not present

## 2018-05-26 DIAGNOSIS — Z9889 Other specified postprocedural states: Secondary | ICD-10-CM

## 2018-05-26 DIAGNOSIS — Z853 Personal history of malignant neoplasm of breast: Secondary | ICD-10-CM | POA: Diagnosis not present

## 2018-05-28 NOTE — Progress Notes (Signed)
[place]  [phone] [phone]   ID: Debbie Joseph DOB: 1940-08-19  MR#: [id number]  IRW#:[id number]  Patient Care Team: Debbie Frees, MD as PCP - General (Family Medicine) Debbie Joseph, Debbie Dad, MD as Consulting Physician (Oncology) Debbie Seltzer, MD as Consulting Physician (General Surgery) Debbie Pray, MD as Consulting Physician (Radiation Oncology) Debbie Harp, MD as Consulting Physician (Cardiology) OTHERS:    CHIEF COMPLAINT: Noninvasive breast cancer  CURRENT TREATMENT: tamoxifen   BREAST CANCER HISTORY: From the original intake note:   Debbie Joseph was first diagnosed with breast cancer in 11/19/2000, when she underwent biopsy of a left breast ductal carcinoma in situ under Debbie Joseph. She underwent a fuller excision 12/18/2000 and achieve clear margins, although the closest margin remained at 1 mm. She then received radiation therapy and took raloxifene/Evista for approximately 5 years. She discontinued the medication partly because of cost and partly because of concerns regarding arthralgias and myalgias, which however most likely were not related.  She then did fine until 04/17/2014 where bilateral screening mammography with tomography at the breast Center showed new calcifications in the right breast. On 05/01/2014 she underwent right diagnostic mammography which found the breast density to be category C. There were fine pleomorphic calcifications spanning approximately 5 cm in the lower outer quadrant of the right breast. There was no associated mass.  On the same day needle core biopsy was obtained and showed ([id number]) ductal carcinoma in situ, high-grade, estrogen receptor 88% positive, with strong staining intensity; progesterone receptor 60% positive, with moderate staining intensity.  On 05/04/2014 the patient underwent bilateral breast MRI, which showed no findings of interest in the left breast and no abnormal appearing  lymph nodes. In the right breast lower outer quadrant there was a 4.57 m area of clumped nodular enhancement. This was felt to correspond closely to the extent of mammographic calcifications.  Accordingly on 05/08/2014 the patient underwent right lumpectomy with additional right margin resection. The pathology from this procedure ([id number]) showed ductal carcinoma in situ, high-grade, with no invasive component. It measured 3 cm in greatest dimension. There was an additional centimeter of disease associated with the right medial margin portion, but even so margins remained extremely close at less than a millimeter.  Accordingly on 05/15/2014 the patient underwent further surgery for margin clearance. Excision of the right medial and right superior margins found no residual carcinoma.  The patient's subsequent history is as detailed below   INTERVAL HISTORY: Debbie Joseph returns today for follow-up and treatment of her ductal carcinoma in situ.   She continues on tamoxifen. She has some vaginal wetness, which she wears a pad for.   Since her last visit here, she underwent a chest CT without contrast on 05/12/2018 showing multiple small pulmonary nodules measuring 2-4 mm in size in the right lung, nonspecific, but stable compared to the prior study and statistically likely benign. No follow-up needed if patient is low-risk (and has no known or suspected primary neoplasm). Non-contrast chest CT can be considered in 12 months if patient is high-risk. This recommendation follows the consensus statement: Guidelines for Management of Incidental Pulmonary Nodules Detected on CT Images: From the Fleischner Society 2017; Radiology 2017; 284:228-243. No acute findings are noted in the thorax to account for the patient's symptoms. Aortic atherosclerosis.  She also underwent a digital diagnostic bilateral mammogram with tomography on 05/26/2018 showing: Breast Density Category C. There are stable posttreatment changes  bilaterally without mammographic evidence of malignancy.   REVIEW OF  SYSTEMS: Debbie Joseph says she feels good. She recently went to [place] in September 2019, and she will be going to [place] on Saturday to speak in a women's conference. Her daughter, son-in-law, and sister-in-law moved in while their house is under Architect. She has noticed some "dents" in her finger nails. She feels some itching in her left superior breast. She felt a "fog" for the last few years, but she has felt more clear since the summer of 2019. The patient denies unusual headaches, visual changes, nausea, vomiting, or dizziness. There has been no unusual cough, phlegm production, or pleurisy. This been no change in bowel or bladder habits. The patient denies unexplained fatigue or unexplained weight loss, bleeding, rash, or fever. A detailed review of systems was otherwise noncontributory.    PAST MEDICAL HISTORY: Past Medical History:  Diagnosis Date  . Breast cancer ([place])    left breast  . Breast cancer, right breast ([place])   . Diabetes mellitus without complication ([place])   . Dyspnea on exertion   . Family history of adverse reaction to anesthesia    maternal aunt- slow to wake up   . Family history of breast cancer   . GERD (gastroesophageal reflux disease)   . Headache    hx of migraines greater than 20 years ago   . Hypertension   . Personal history of radiation therapy   . Radiation 07/04/14-08/22/14   right breast 50.4 Gy, lumpectomy cavity boosted to 10 Gy    PAST SURGICAL HISTORY: Past Surgical History:  Procedure Laterality Date  . APPENDECTOMY  1974   with hernia  . BREAST BIOPSY    . BREAST LUMPECTOMY Bilateral    right 2016/ left 2002  . BREAST LUMPECTOMY WITH NEEDLE LOCALIZATION Right 05/08/2014   Procedure: BREAST LUMPECTOMY WITH NEEDLE LOCALIZATION;  Surgeon: Debbie Seltzer, MD;  Location: [facility];  Service: General;  Laterality: Right;  . BREAST SURGERY  2002   lt breast  lump-cancer  . CATARACT EXTRACTION Bilateral 2009  . COLONOSCOPY    . dental inplant  2015  . PARTIAL HYSTERECTOMY  1983  . RE-EXCISION OF BREAST LUMPECTOMY Right 05/15/2014   Procedure: RE-EXCISION OF BREAST LUMPECTOMY SUPERIOR AND MEDIAL MARGINS;  Surgeon: Debbie Seltzer, MD;  Location: [facility];  Service: General;  Laterality: Right;  . UMBILICAL HERNIA REPAIR  1974    FAMILY HISTORY Family History  Problem Relation Age of Onset  . Stroke Mother 66  . Hypertension Mother   . Lung cancer Father 22  . Stroke Maternal Grandfather 60  . Hypertension Maternal Grandfather   . Tuberculosis Paternal Grandmother   . Hypertension Brother   . Diabetes Brother   . Urolithiasis Brother   . Leukemia Sister 23       AML  . Diabetes Sister   . Hypertension Maternal Aunt   . Breast cancer Maternal Aunt 58  . Hypertension Maternal Uncle   . Colon cancer Maternal Grandmother        dx in her 22s   The patient's father died from lung cancer at the age of 21, in the setting of tobacco abuse. The patient's mother died at the age of 51. The patient's mother only sister was diagnosed with breast cancer at the age of 92. Lauriana herself has 3 brothers and 2 sisters. One of her sisters was diagnosed with acute myeloid leukemia at the age of 50. She survives. A maternal grandmother may have had colon or other cancer, diagnosed in her 2s.  There is no other history of cancer in the family to the patient's knowledge   GYNECOLOGIC HISTORY:  No LMP recorded. Patient has had a hysterectomy. Menarche age 32 or the patient is GX P0. She took hormone replacement for approximately 15 years, and fertility treatments again very briefly. 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 She and her husband do quite a bit of [employer] work. 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 There are 2 adopted children are [name] lives in [facility] [place] [facility] and is currently looking for a job (his will be teaching at [employer] there), and Crows Nest lives in [place] and is a definite card specialist. The patient has no grandchildren.   ADVANCED DIRECTIVES: Not in place; at her 06/26/2014 visit the patient was given the appropriate documents 2 complete and not arise at her discretion.   HEALTH MAINTENANCE: Social History   Tobacco Use  . Smoking status: Never Smoker  . Smokeless tobacco: Never Used  Substance Use Topics  . Alcohol use: No  . Drug use: No     Colonoscopy: 2008?/[facility]  PAP: Status post hysterectomy  Bone density: 2007/normal  Lipid panel:  Allergies  Allergen Reactions  . Evista [Raloxifene] Other (See Comments)    Made joints hurt "really bad"    Current Outpatient Medications  Medication Sig Dispense Refill  . Digestive Enzymes (MULTI-ENZYME PO) Take by mouth.    . hydroxypropyl methylcellulose / hypromellose (ISOPTO TEARS / GONIOVISC) 2.5 % ophthalmic solution Place 1 drop into both eyes 4 (four) times daily.     . irbesartan-hydrochlorothiazide (AVALIDE) 150-12.5 MG tablet Take 1 tablet by mouth daily.    . Multiple Vitamins-Minerals (WOMENS 50+ MULTI VITAMIN/MIN PO) Take 1 tablet by mouth daily.    . RED YEAST RICE EXTRACT PO Take 1 tablet by mouth daily.     . tamoxifen (NOLVADEX) 20 MG tablet Take 1 tablet (20 mg total) by mouth daily. 90 tablet 4  . UNABLE TO FIND 1 tablet daily. Vit B12 B6     No current facility-administered medications for this visit.     OBJECTIVE: Middle-aged white woman who appears well  Vitals:   06/01/18 1122  BP: (!) 161/80  Pulse: 81  Resp: 18  Temp: 98.8 F (37.1 C)  SpO2: 96%     Body mass index is 28.89 kg/m.    ECOG FS:0 - Asymptomatic  Sclerae unicteric, pupils round and equal Oropharynx clear and moist No cervical or supraclavicular adenopathy Lungs no  rales or rhonchi Heart regular rate and rhythm Abd soft, nontender, positive bowel sounds MSK no focal spinal tenderness, no upper extremity lymphedema Neuro: nonfocal, well oriented, appropriate affect Breasts: That is post bilateral lumpectomies and bilateral radiation.  There is no evidence of local recurrence.  CMP     Component Value Date/Time   NA 141 05/15/2014 0800   K 4.3 05/15/2014 0800   CL 106 05/15/2014 0800   CO2 29 05/15/2014 0800   GLUCOSE 127 (H) 05/15/2014 0800   BUN 22 05/15/2014 0800   CREATININE 0.74 05/15/2014 0800   CREATININE 0.76 02/22/2014 0855   CALCIUM 9.1 05/15/2014 0800   PROT 6.5 10/31/2014 0842   ALBUMIN 4.0 10/31/2014 0842   AST 15 10/31/2014 0842   ALT 13 10/31/2014 0842   ALKPHOS 52 10/31/2014 0842  BILITOT 0.3 10/31/2014 0842   GFRNONAA 83 (L) 05/15/2014 0800   GFRAA >90 05/15/2014 0800    INo results found for: SPEP, UPEP  Lab Results  Component Value Date   WBC 7.7 05/15/2014   HGB 13.7 05/15/2014   HCT 42.4 05/15/2014   MCV 89.6 05/15/2014   PLT 190 05/15/2014      Chemistry      Component Value Date/Time   NA 141 05/15/2014 0800   K 4.3 05/15/2014 0800   CL 106 05/15/2014 0800   CO2 29 05/15/2014 0800   BUN 22 05/15/2014 0800   CREATININE 0.74 05/15/2014 0800   CREATININE 0.76 02/22/2014 0855      Component Value Date/Time   CALCIUM 9.1 05/15/2014 0800   ALKPHOS 52 10/31/2014 0842   AST 15 10/31/2014 0842   ALT 13 10/31/2014 0842   BILITOT 0.3 10/31/2014 0842       No results found for: LABCA2  No components found for: ONGEX528  No results for input(s): INR in the last 168 hours.  Urinalysis No results found for: COLORURINE, APPEARANCEUR, LABSPEC, PHURINE, GLUCOSEU, HGBUR, BILIRUBINUR, KETONESUR, PROTEINUR, UROBILINOGEN, NITRITE, LEUKOCYTESUR   STUDIES: Ct Chest Wo Contrast  Result Date: 05/12/2018 CLINICAL DATA:  78 year old female with history of chronic cough for the past several years. Pulmonary  nodules noted on prior coronary calcium score. Follow-up study. EXAM: CT CHEST WITHOUT CONTRAST TECHNIQUE: Multidetector CT imaging of the chest was performed following the standard protocol without IV contrast. COMPARISON:  Coronary calcium score examination 10/14/2017. FINDINGS: Cardiovascular: Heart size is normal. There is no significant pericardial fluid, thickening or pericardial calcification. No atherosclerotic calcifications in the thoracic aorta or the coronary arteries. Mediastinum/Nodes: No pathologically enlarged mediastinal or hilar lymph nodes. Please note that accurate exclusion of hilar adenopathy is limited on noncontrast CT scans. Esophagus is unremarkable in appearance. No axillary lymphadenopathy. Lungs/Pleura: Small amount of subpleural reticulation deep to the right breast in the anterior aspect of the right upper lobe and right middle lobe, most compatible with mild postradiation changes. Immediately deep to this there are some clustered peribronchovascular micronodules measuring 2-4 mm in size, stable compared to the prior examination, and likely to reflect areas of mild mucoid impaction within terminal bronchioles. 3 mm right lower lobe nodule (axial image 89 of series 8) also unchanged compared to the prior study. No larger more suspicious appearing pulmonary nodules or masses are noted. No acute consolidative airspace disease. No pleural effusions. Upper Abdomen: Aortic atherosclerosis. Musculoskeletal: There are no aggressive appearing lytic or blastic lesions noted in the visualized portions of the skeleton. IMPRESSION: 1. Multiple small pulmonary nodules measuring 2-4 mm in size in the right lung, nonspecific, but stable compared to the prior study and statistically likely benign. No follow-up needed if patient is low-risk (and has no known or suspected primary neoplasm). Non-contrast chest CT can be considered in 12 months if patient is high-risk. This recommendation follows the  consensus statement: Guidelines for Management of Incidental Pulmonary Nodules Detected on CT Images: From the Fleischner Society 2017; Radiology 2017; 284:228-243. 2. No acute findings are noted in the thorax to account for the patient's symptoms. 3. Aortic atherosclerosis. Aortic Atherosclerosis (ICD10-I70.0). Electronically Signed   By: Vinnie Langton M.D.   On: 05/12/2018 17:00   Mm Diag Breast Tomo Bilateral  Result Date: 05/26/2018 CLINICAL DATA:  78 year old female status post right lumpectomy with radiation in 2016 and left lumpectomy with radiation in 2002. No current symptoms. EXAM: DIGITAL DIAGNOSTIC BILATERAL MAMMOGRAM WITH  CAD AND TOMO COMPARISON:  Previous exam(s). ACR Breast Density Category c: The breast tissue is heterogeneously dense, which may obscure small masses. FINDINGS: Stable post lumpectomy and post radiation changes are demonstrated bilaterally. No new or suspicious findings are identified in either breast. The parenchymal pattern is stable. Mammographic images were processed with CAD. IMPRESSION: Stable posttreatment changes bilaterally without mammographic evidence of malignancy. RECOMMENDATION: Diagnostic mammogram is suggested in 1 year. (Code:[id number]) I have discussed the findings and recommendations with the patient. Results were also provided in writing at the conclusion of the visit. If applicable, a reminder letter will be sent to the patient regarding the next appointment. BI-RADS CATEGORY  2: Benign. Electronically Signed   By: Kristopher Oppenheim M.D.   On: 05/26/2018 11:53    ASSESSMENT: 78 y.o. [place] woman  (1) status post left breast biopsy 11/19/2000 for ductal carcinoma in situ, high-grade followed by reexcision for clear margins 12/18/2000  (a) status post adjuvant radiation to the left breast  (b) received raloxifene for approximately 5 years  (2) status post right breast lower outer quadrant core biopsy 05/01/2014 for ductal carcinoma in situ,  high-grade, estrogen receptor 88% positive, progesterone receptor 60% positive  (3)  right breast lumpectomy with additional right medial margin surgery 05/08/2014 showed a 3 cm ductal carcinoma in situ, high-grade, less than 1 mm from the final medial margin  (4) right breast reexcision 05/15/2014 showed no residual ductal carcinoma in situ  (5) adjuvant radiation 07/05/2014-08/22/2014: Right breast 50.4 Gy, lumpectomy cavity boost 10 Gy  (6) tamoxifen started May 2016.   PLAN: Jaiya is 4 years out from definitive diagnosis of breast cancer with no evidence of disease recurrence.  This is very favorable.  She is tolerating tamoxifen well.  She will discontinue this April 2021 and I will see her then for 1 last visit.  We reviewed her CT scan of the chest which shows some nonspecific nodules which have not changed.  These are going to be benign.  It may be worth it to obtain a noncontrast chest CT in a year just to confirm absolute stability.  I have encouraged her to exercise more regularly.  She will call with any other issues that may develop before her next visit here.   Leyli Kevorkian, Debbie Dad, MD  06/01/18 12:19 PM Medical Oncology and Hematology [facility] [address] [place], [place] [address] Tel. [phone]    Fax. [phone]  I, Jacqualyn Posey am acting as a Education administrator for Chauncey Cruel, MD.   I, Lurline Del MD, have reviewed the above documentation for accuracy and completeness, and I agree with the above.

## 2018-06-01 ENCOUNTER — Telehealth: Payer: Self-pay | Admitting: Oncology

## 2018-06-01 ENCOUNTER — Inpatient Hospital Stay: Payer: Medicare Other | Attending: Oncology

## 2018-06-01 ENCOUNTER — Inpatient Hospital Stay (HOSPITAL_BASED_OUTPATIENT_CLINIC_OR_DEPARTMENT_OTHER): Payer: Medicare Other | Admitting: Oncology

## 2018-06-01 VITALS — BP 161/80 | HR 81 | Temp 98.8°F | Resp 18 | Wt 173.6 lb

## 2018-06-01 DIAGNOSIS — C50511 Malignant neoplasm of lower-outer quadrant of right female breast: Secondary | ICD-10-CM

## 2018-06-01 DIAGNOSIS — D0512 Intraductal carcinoma in situ of left breast: Secondary | ICD-10-CM | POA: Diagnosis not present

## 2018-06-01 DIAGNOSIS — Z79899 Other long term (current) drug therapy: Secondary | ICD-10-CM | POA: Diagnosis not present

## 2018-06-01 DIAGNOSIS — Z923 Personal history of irradiation: Secondary | ICD-10-CM

## 2018-06-01 DIAGNOSIS — R918 Other nonspecific abnormal finding of lung field: Secondary | ICD-10-CM

## 2018-06-01 DIAGNOSIS — Z170 Estrogen receptor positive status [ER+]: Secondary | ICD-10-CM | POA: Diagnosis not present

## 2018-06-01 DIAGNOSIS — Z79810 Long term (current) use of selective estrogen receptor modulators (SERMs): Secondary | ICD-10-CM

## 2018-06-01 DIAGNOSIS — E119 Type 2 diabetes mellitus without complications: Secondary | ICD-10-CM | POA: Insufficient documentation

## 2018-06-01 MED ORDER — TAMOXIFEN CITRATE 20 MG PO TABS: 20.0000 mg | ORAL_TABLET | Freq: Every day | ORAL | 4 refills | Status: DC

## 2018-06-01 NOTE — Telephone Encounter (Signed)
Gave avs and calendar ° °

## 2018-06-03 DIAGNOSIS — C50911 Malignant neoplasm of unspecified site of right female breast: Secondary | ICD-10-CM | POA: Diagnosis not present

## 2018-06-03 DIAGNOSIS — E7800 Pure hypercholesterolemia, unspecified: Secondary | ICD-10-CM | POA: Diagnosis not present

## 2018-06-03 DIAGNOSIS — I10 Essential (primary) hypertension: Secondary | ICD-10-CM | POA: Diagnosis not present

## 2018-06-03 DIAGNOSIS — Z0000 Encounter for general adult medical examination without abnormal findings: Secondary | ICD-10-CM | POA: Diagnosis not present

## 2018-06-03 DIAGNOSIS — E119 Type 2 diabetes mellitus without complications: Secondary | ICD-10-CM | POA: Diagnosis not present

## 2018-06-28 DIAGNOSIS — H353131 Nonexudative age-related macular degeneration, bilateral, early dry stage: Secondary | ICD-10-CM | POA: Diagnosis not present

## 2018-06-28 DIAGNOSIS — Z961 Presence of intraocular lens: Secondary | ICD-10-CM | POA: Diagnosis not present

## 2018-06-28 DIAGNOSIS — H35033 Hypertensive retinopathy, bilateral: Secondary | ICD-10-CM | POA: Diagnosis not present

## 2018-06-28 DIAGNOSIS — H5213 Myopia, bilateral: Secondary | ICD-10-CM | POA: Diagnosis not present

## 2018-06-28 DIAGNOSIS — H04123 Dry eye syndrome of bilateral lacrimal glands: Secondary | ICD-10-CM | POA: Diagnosis not present

## 2018-07-26 ENCOUNTER — Ambulatory Visit (HOSPITAL_COMMUNITY): Payer: Medicare Other

## 2018-09-07 ENCOUNTER — Telehealth: Payer: Self-pay | Admitting: *Deleted

## 2018-09-07 NOTE — Telephone Encounter (Signed)
Mrs.Mcnaught, will call us if needed,(prn).

## 2018-12-08 DIAGNOSIS — E7800 Pure hypercholesterolemia, unspecified: Secondary | ICD-10-CM | POA: Diagnosis not present

## 2018-12-08 DIAGNOSIS — Z853 Personal history of malignant neoplasm of breast: Secondary | ICD-10-CM | POA: Diagnosis not present

## 2018-12-08 DIAGNOSIS — I10 Essential (primary) hypertension: Secondary | ICD-10-CM | POA: Diagnosis not present

## 2018-12-08 DIAGNOSIS — E1169 Type 2 diabetes mellitus with other specified complication: Secondary | ICD-10-CM | POA: Diagnosis not present

## 2018-12-20 ENCOUNTER — Encounter: Payer: Self-pay | Admitting: Oncology

## 2018-12-20 DIAGNOSIS — E7800 Pure hypercholesterolemia, unspecified: Secondary | ICD-10-CM | POA: Diagnosis not present

## 2018-12-20 DIAGNOSIS — E1169 Type 2 diabetes mellitus with other specified complication: Secondary | ICD-10-CM | POA: Diagnosis not present

## 2018-12-20 DIAGNOSIS — I10 Essential (primary) hypertension: Secondary | ICD-10-CM | POA: Diagnosis not present

## 2018-12-30 DIAGNOSIS — Z23 Encounter for immunization: Secondary | ICD-10-CM | POA: Diagnosis not present

## 2019-04-13 ENCOUNTER — Other Ambulatory Visit: Payer: Self-pay | Admitting: Family Medicine

## 2019-04-13 DIAGNOSIS — Z853 Personal history of malignant neoplasm of breast: Secondary | ICD-10-CM

## 2019-05-02 ENCOUNTER — Encounter: Payer: Self-pay | Admitting: Oncology

## 2019-05-05 ENCOUNTER — Ambulatory Visit: Payer: Medicare Other | Attending: Internal Medicine

## 2019-05-05 DIAGNOSIS — Z23 Encounter for immunization: Secondary | ICD-10-CM | POA: Insufficient documentation

## 2019-05-05 NOTE — Progress Notes (Signed)
   Covid-19 Vaccination Clinic  Name:  Debbie Joseph    MRN: [id number] DOB: March 13, 1941  05/05/2019  [name] was observed post Covid-19 immunization for 15 minutes without incidence. She was provided with Vaccine Information Sheet and instruction to access the V-Safe system.   [name] was instructed to call 911 with any severe reactions post vaccine: [name] Difficulty breathing  . Swelling of your face and throat  . A fast heartbeat  . A bad rash all over your body  . Dizziness and weakness    Immunizations Administered    Name Date Dose VIS Date Route   Pfizer COVID-19 Vaccine 05/05/2019 10:18 AM 0.3 mL 04/01/2019 Intramuscular   Manufacturer: [employer], [employer]   Lot: [id number]   [place]: [id number]

## 2019-05-06 DIAGNOSIS — I890 Lymphedema, not elsewhere classified: Secondary | ICD-10-CM | POA: Diagnosis not present

## 2019-05-06 DIAGNOSIS — D0511 Intraductal carcinoma in situ of right breast: Secondary | ICD-10-CM | POA: Diagnosis not present

## 2019-05-10 ENCOUNTER — Ambulatory Visit: Payer: Medicare Other | Attending: General Surgery

## 2019-05-10 ENCOUNTER — Other Ambulatory Visit: Payer: Self-pay

## 2019-05-10 DIAGNOSIS — I890 Lymphedema, not elsewhere classified: Secondary | ICD-10-CM

## 2019-05-10 DIAGNOSIS — C50511 Malignant neoplasm of lower-outer quadrant of right female breast: Secondary | ICD-10-CM | POA: Diagnosis not present

## 2019-05-10 DIAGNOSIS — Z170 Estrogen receptor positive status [ER+]: Secondary | ICD-10-CM | POA: Insufficient documentation

## 2019-05-10 DIAGNOSIS — M25611 Stiffness of right shoulder, not elsewhere classified: Secondary | ICD-10-CM

## 2019-05-10 NOTE — Therapy (Addendum)
[place], [place], [address] Phone: [phone]   Fax:  [phone]  Physical Therapy Evaluation  Patient Details  Name: Debbie Joseph MRN: [id number] Date of Birth: Apr 03, 1941 Referring Provider (PT): Stark Klein MD   Encounter Date: 05/10/2019  PT End of Session - 05/10/19 0902    Visit Number  1    Number of Visits  9    Date for PT Re-Evaluation  06/14/19    PT Start Time  0802    PT Stop Time  0858    PT Time Calculation (min)  56 min    Activity Tolerance  Patient tolerated treatment well    Behavior During Therapy  [facility] for tasks assessed/performed       Past Medical History:  Diagnosis Date  . Breast cancer ([place])    left breast  . Breast cancer, right breast ([place])   . Diabetes mellitus without complication ([place])   . Dyspnea on exertion   . Family history of adverse reaction to anesthesia    maternal aunt- slow to wake up   . Family history of breast cancer   . GERD (gastroesophageal reflux disease)   . Headache    hx of migraines greater than 20 years ago   . Hypertension   . Personal history of radiation therapy   . Radiation 07/04/14-08/22/14   right breast 50.4 Gy, lumpectomy cavity boosted to 10 Gy    Past Surgical History:  Procedure Laterality Date  . APPENDECTOMY  1974   with hernia  . BREAST BIOPSY    . BREAST LUMPECTOMY Bilateral    right 2016/ left 2002  . BREAST LUMPECTOMY WITH NEEDLE LOCALIZATION Right 05/08/2014   Procedure: BREAST LUMPECTOMY WITH NEEDLE LOCALIZATION;  Surgeon: Excell Seltzer, MD;  Location: [facility];  Service: General;  Laterality: Right;  . BREAST SURGERY  2002   lt breast lump-cancer  . CATARACT EXTRACTION Bilateral 2009  . COLONOSCOPY    . dental inplant  2015  . PARTIAL HYSTERECTOMY  1983  . RE-EXCISION OF BREAST LUMPECTOMY Right 05/15/2014   Procedure: RE-EXCISION OF BREAST LUMPECTOMY SUPERIOR AND MEDIAL MARGINS;   Surgeon: Excell Seltzer, MD;  Location[facility];  Service: General;  Laterality: Right;  . UMBILICAL HERNIA REPAIR  1974    There were no vitals filed for this visit.   Subjective Assessment - 05/10/19 0806    Subjective  Pt reports that right after surgery she started to notice sharp pains at the lateral inferior portion of her R breast. She states that it is a shooting burning pain that is intermittent. Most of the time her pain is just an aching pain that feels better when she holds her breast. She states that twisting toward the R makes her pain worse and she is unable to pick objects up off the floor with her R arm due to pain. She reports that occasionally it is hard for her to lift her arm up overhead but this is not continuous.    Pertinent History  L breast cancer in 2002 w/lumpectomy and radiation. R breast cancer in 2015 with lumpectomy and radiation    Patient Stated Goals  I want to see if I can get some of this swelling out of my breast and decrease my pain.    Currently in Pain?  Yes    Pain Score  2    10/10 with shooting pain   Pain Location  Breast  Pain Orientation  Right    Pain Descriptors / Indicators  Aching;Shooting    Pain Type  Chronic pain    Pain Radiating Towards  into the R breast    Pain Onset  More than a month ago    Pain Frequency  Intermittent    Aggravating Factors   twisting to the R    Pain Relieving Factors  compression    Effect of Pain on Daily Activities  Pain intermittently throughout the day but she limits what she does becuase she is worried that she will have pain.    Multiple Pain Sites  No         [facility] PT Assessment - 05/10/19 0001      Assessment   Medical Diagnosis  kin     Referring Provider (PT)  Stark Klein MD    Onset Date/Surgical Date  05/08/14    Hand Dominance  Right    Prior Therapy  None for breast cancer      Precautions   Precautions  Other (comment)   hx of breast cancer      Restrictions   Weight Bearing  Restrictions  No      Balance Screen   Has the patient fallen in the past 6 months  No    Has the patient had a decrease in activity level because of a fear of falling?   No    Is the patient reluctant to leave their home because of a fear of falling?   No      Home Environment   Living Environment  Private residence    Living Arrangements  Spouse/significant other    Type of [place] Access  Stairs to enter    Entrance Stairs-Number of Steps  6    Entrance Stairs-Rails  Cannot reach both;Right;Left    Home Layout  Two level    Alternate Level Stairs-Number of Steps  12    Alternate Level Stairs-Rails  Right;Left;Can reach both      Prior Function   Level of Independence  Independent    Vocation  Retired      Associate Professor   Overall Cognitive Status  Within Functional Limits for tasks assessed      ROM / Strength   AROM / PROM / Strength  AROM      AROM   AROM Assessment Site  Shoulder    Right/Left Shoulder  Right;Left    Right Shoulder Flexion  153 Degrees    Right Shoulder ABduction  141 Degrees    Right Shoulder Internal Rotation  37 Degrees    Right Shoulder External Rotation  85 Degrees    Left Shoulder Flexion  167 Degrees    Left Shoulder ABduction  156 Degrees    Left Shoulder Internal Rotation  35 Degrees    Left Shoulder External Rotation  80 Degrees        LYMPHEDEMA/ONCOLOGY QUESTIONNAIRE - 05/10/19 0835      Type   Cancer Type  R and L ductal carcinoma      Surgeries   Lumpectomy Date  05/06/14    Other Surgery Date  05/15/14    Number Lymph Nodes Removed  0      Treatment   Active Chemotherapy Treatment  No    Past Chemotherapy Treatment  No    Active Radiation Treatment  No    Past Radiation Treatment  Yes    Body Site  R and  L breast    Current Hormone Treatment  Yes    Drug Name  tamoxifen       What other symptoms do you have   Are you Having Heaviness or Tightness  No    Are you having Pain  Yes    Are you having pitting  edema  Yes    Body Site  --   R breast    Is it Hard or Difficult finding clothes that fit  No    Do you have infections  No    Is there Decreased scar mobility  Yes      Lymphedema Stage   Stage  STAGE 1 SPONTANEOUSLY REVERSIBLE      Lymphedema Assessments   Lymphedema Assessments  Upper extremities      Right Upper Extremity Lymphedema   15 cm Proximal to Olecranon Process  33 cm    Olecranon Process  26.5 cm    15 cm Proximal to Ulnar Styloid Process  25.7 cm    Just Proximal to Ulnar Styloid Process  16.3 cm    Across Hand at [employer]  18.2 cm    At [place] of 2nd Digit  5.9 cm      Left Upper Extremity Lymphedema   15 cm Proximal to Olecranon Process  33.8 cm    Olecranon Process  25.5 cm    15 cm Proximal to Ulnar Styloid Process  24.4 cm    Just Proximal to Ulnar Styloid Process  15.5 cm    Across Hand at [employer]  17.9 cm    At [place] of 2nd Digit  6.1 cm    Other  axillary line 98 cm     Other  8 cm down from axilla 105 cm           Quick Dash - 05/10/19 0001    Open a tight or new jar  Severe difficulty    Do heavy household chores (wash walls, wash floors)  Moderate difficulty    Carry a shopping bag or briefcase  Moderate difficulty    Wash your back  Moderate difficulty    Use a knife to cut food  No difficulty    Recreational activities in which you take some force or impact through your arm, shoulder, or hand (golf, hammering, tennis)  Mild difficulty    During the past week, to what extent has your arm, shoulder or hand problem interfered with your normal social activities with family, friends, neighbors, or groups?  Slightly    During the past week, to what extent has your arm, shoulder or hand problem limited your work or other regular daily activities  Slightly    Arm, shoulder, or hand pain.  Mild    Tingling (pins and needles) in your arm, shoulder, or hand  Mild    Difficulty Sleeping  No difficulty    DASH Score  31.82 %         Outpatient Re[facility]10/[facility]  Lymphedema Life Impact Scale Total Score  14.71 %      Objective measurements completed on examination: See above findings.      [place] [facility] Adult PT Treatment/Exercise - 05/10/19 0001      Exercises   Exercises  Other Exercises    Other Exercises   lymphatic facilitation exercises: cervical flexion/extension, R/L side bending, R/L rotation, Bil shoulder flexion, Bil shoulder shrug/depression, scapular retraction, R/L side bending of the trunk, Standing  hip extension Bil, Elbow flexion/extension, wrist flexion/extension, fist pump over head demonstration for all movements 5x and VC to avoid pain             PT Education - 05/10/19 0900    Education Details  Access Code: [email or web address], Pt was educated on the anatomy/physiology of the lymphatic system and discussed the risk of lymphedema following lumpectomy and radiation. Educated pt on complete decongestive therapy including skin care, manual lymph drainage, exercise and compression. Discussed garments including a sleeve for the RUE and compression bra due to edema in the R breast.    Person(s) Educated  Patient    Methods  Explanation;Demonstration;Verbal cues;Handout    Comprehension  Verbalized understanding;Returned demonstration       PT Short Term Goals - 05/10/19 0924      PT SHORT TERM GOAL #1   Title  Pt will be independent with initial HEP and MLD in order to demonstrate autonomy of care.    Baseline  pt does not have an HEP or know how to perform MLD    Time  2    Period  Weeks    Status  New    Target Date  05/31/19        PT Long Term Goals - 05/10/19 0924      PT LONG TERM GOAL #1   Title  Pt will demonstrate improvement in R shoulder ROM to 160 degrees flexion, 150 degrees abduction in order to demonstrate improve functional mobility.    Baseline  R shoulder flexion 153, R shoulder abduction 141    Time  4    Period  Weeks     Status  New    Target Date  06/14/19      PT LONG TERM GOAL #2   Title  Pt will report 50% improvement in function and pain in the R breast/R shoulder from her initial evaluation in order to demonstrate an improve quality of life to decrease risk for immobility.    Baseline  10/10 pain at the worst, every day intermittently, 3/10 pain base line.    Time  4    Period  Weeks    Status  New    Target Date  06/14/19      PT LONG TERM GOAL #3   Title  Pt will have appropriate compression garments to wear on a dialy basis in order to decrease risk for increase edema.    Baseline  pt does not have compression garments.    Time  4    Period  Weeks    Status  New    Target Date  06/14/19      PT LONG TERM GOAL #4   Title  Pt will have 1 cm reduction in RUE circumferential measurements in the R distal brachium and R proximal antebrachium in order to demonstrate successful management of fluid at home.    Baseline  see measurements.    Time  4    Period  Weeks    Status  New    Target Date  06/14/19             Plan - 05/10/19 0902    Clinical Impression Statement  Pt presents to physical therapy with reports of pain in her R breast and stiffness in her R shoulder. She demonstrates decreased ROM in her R shoulder compared to her L shoulder with flexion and abduction. She has increased circumferential measurements in her proximal antebrachium/distal brachium in the  RUE compared to the L. Pt has visible edema in the R breast compared to the L with fibrotic tissue changes in both breast most likely due to scar tissue and fluid. Pt will benefit from skilled physical therapy services 2x/week for 4 weeks in order to address the above limitations and decrease risk for immobility and infection related to fluid build up.    Stability/Clinical Decision Making  Stable/Uncomplicated    Clinical Decision Making  Low    Rehab Potential  Excellent    PT Frequency  2x / week    PT Duration  4 weeks     PT Treatment/Interventions  Iontophoresis 4mg /ml Dexamethasone;Therapeutic activities;Therapeutic exercise;Neuromuscular re-education;Manual techniques    PT Next Visit Plan  Begin teaching self MLD, check for signed script, sign the alight form    PT Home Exercise Plan  Access Code: [email or web address]    Recommended Other Services  compression sleeve and bra    Consulted and Agree with Plan of Care  Patient       Patient will benefit from skilled therapeutic intervention in order to improve the following deficits and impairments:  Decreased range of motion, Pain, Increased edema  Visit Diagnosis: Malignant neoplasm of lower-outer quadrant of right breast of female, estrogen receptor positive ([place]) - Plan: PT plan of care cert/re-cert  Lymphedema, not elsewhere classified - Plan: PT plan of care cert/re-cert  Stiffness of right shoulder, not elsewhere classified - Plan: PT plan of care cert/re-cert     Problem List Patient Active Problem List   Diagnosis Date Noted  . Family history of breast cancer   . Malignant neoplasm of lower-outer quadrant of right breast of female, estrogen receptor positive ([place]) 05/31/2014  . Essential hypertension 12/08/2013  . Dyspnea on exertion 12/08/2013    Ander Purpura , PT 05/10/2019, 9:59 AM  [place], [place], [address] Phone: [phone]   Fax:  [phone]  Name: Debbie Joseph MRN: [id number] Date of Birth: 1940-07-21

## 2019-05-10 NOTE — Patient Instructions (Signed)
Access Code: [email or web address]  URL: [email or web address]  Date: 05/10/2019  Prepared by: Tomma Rakers   Exercises Standing Upper Cervical Flexion and Extension - 20 reps - 1 sets - 1x daily - 7x weekly Standing Cervical Sidebending AROM - 20 reps - 1 sets - 1x daily - 7x weekly Standing Cervical Rotation AROM - 20 reps - 1 sets - 1x daily - 7x weekly Standing Shoulder Flexion Full Range - 20 reps - 1 sets - 1x daily - 7x weekly Standing Shoulder Shrugs - 20 reps - 1 sets - 1x daily - 7x weekly Standing Scapular Retraction - 20 reps - 1 sets - 1x daily - 7x weekly Trunk Sidebending with Compression Garment - 20 reps - 1 sets - 1x daily - 7x weekly Standing Hip Extension with Counter Support - 20 reps - 1 sets - 1x daily - 7x weekly Elbow AROM Flexion & Extension Supinated Forearm - 20 reps - 1 sets - 1x daily - 7x weekly Wrist AROM Flexion Extension - 20 reps - 1 sets - 1x daily - 7x weekly Hand Fist Pumps - 20 reps - 1 sets - 1x daily - 7x weekly

## 2019-05-12 ENCOUNTER — Other Ambulatory Visit: Payer: Self-pay

## 2019-05-12 ENCOUNTER — Ambulatory Visit: Payer: Medicare Other

## 2019-05-12 DIAGNOSIS — I890 Lymphedema, not elsewhere classified: Secondary | ICD-10-CM | POA: Diagnosis not present

## 2019-05-12 DIAGNOSIS — M25611 Stiffness of right shoulder, not elsewhere classified: Secondary | ICD-10-CM

## 2019-05-12 DIAGNOSIS — C50511 Malignant neoplasm of lower-outer quadrant of right female breast: Secondary | ICD-10-CM

## 2019-05-12 DIAGNOSIS — Z170 Estrogen receptor positive status [ER+]: Secondary | ICD-10-CM | POA: Diagnosis not present

## 2019-05-12 NOTE — Therapy (Signed)
[place], [place], [address] Phone: [phone]   Fax:  [phone]  Physical Therapy Treatment  Patient Details  Name: Debbie Joseph MRN: [id number] Date of Birth: 05-12-1940 Referring Provider (PT): Stark Klein MD   Encounter Date: 05/12/2019  PT End of Session - 05/12/19 1009    Visit Number  2    Number of Visits  9    Date for PT Re-Evaluation  06/14/19    PT Start Time  1007    PT Stop Time  1100    PT Time Calculation (min)  53 min    Activity Tolerance  Patient tolerated treatment well    Behavior During Therapy  [facility] for tasks assessed/performed       Past Medical History:  Diagnosis Date  . Breast cancer ([place])    left breast  . Breast cancer, right breast ([place])   . Diabetes mellitus without complication ([place])   . Dyspnea on exertion   . Family history of adverse reaction to anesthesia    maternal aunt- slow to wake up   . Family history of breast cancer   . GERD (gastroesophageal reflux disease)   . Headache    hx of migraines greater than 20 years ago   . Hypertension   . Personal history of radiation therapy   . Radiation 07/04/14-08/22/14   right breast 50.4 Gy, lumpectomy cavity boosted to 10 Gy    Past Surgical History:  Procedure Laterality Date  . APPENDECTOMY  1974   with hernia  . BREAST BIOPSY    . BREAST LUMPECTOMY Bilateral    right 2016/ left 2002  . BREAST LUMPECTOMY WITH NEEDLE LOCALIZATION Right 05/08/2014   Procedure: BREAST LUMPECTOMY WITH NEEDLE LOCALIZATION;  Surgeon: Excell Seltzer, MD;  Location: [facility];  Service: General;  Laterality: Right;  . BREAST SURGERY  2002   lt breast lump-cancer  . CATARACT EXTRACTION Bilateral 2009  . COLONOSCOPY    . dental inplant  2015  . PARTIAL HYSTERECTOMY  1983  . RE-EXCISION OF BREAST LUMPECTOMY Right 05/15/2014   Procedure: RE-EXCISION OF BREAST LUMPECTOMY SUPERIOR AND MEDIAL MARGINS;  Surgeon:  Excell Seltzer, MD;  [facility];  Service: General;  Laterality: Right;  . UMBILICAL HERNIA REPAIR  1974    There were no vitals filed for this visit.  Subjective Assessment - 05/12/19 1009    Pertinent History  L breast cancer in 2002 w/lumpectomy and radiation. R breast cancer in 2015 with lumpectomy and radiation    Patient Stated Goals  I want to see if I can get some of this swelling out of my breast and decrease my pain.    Currently in Pain?  Yes    Pain Score  2     Pain Location  Breast    Pain Orientation  Right    Pain Descriptors / Indicators  Aching    Pain Type  Chronic pain    Pain Onset  More than a month ago    Pain Frequency  Intermittent    Effect of Pain on Daily Activities  Pain intermittently throughout the day but she limits what she does becuase she is worried that she will have pain.                  Outpatient Rehab from [facility]10/[facility]  Lymphedema Life Impact Scale Total Score  14.71 %  [place] Adult PT Treatment/Exercise - 05/12/19 0001      Manual Therapy   Manual Therapy  Soft tissue mobilization;Manual Lymphatic Drainage (MLD)    Soft tissue mobilization  STM to the R deltoid, upper trapezius; decreased minimally following light STM    Manual Lymphatic Drainage (MLD)  Pt was educated on self MLD modified with 5 diaphragmatic breathes, short neck, Bil axillary and inguinal nodes, R axillo-inguinal anastomosis, inferior R breast, axillo-inguinal anastomosis, lateral R brachium, medial to lateral brachium, lateral brachium, re-worked anastomosis, all surfaces of antebrachium, dorsum of the hand then re-worked all surfaces pt was educated with VC, demonstration and hand over hand facilitation for correct direction, pressure and skin stretch. Pt required extra time for inferior breast hand placement. Following STM MLD was performed in supine: short neck, swimming in the terminus, Bil  shoulders, Bil axillary nodes, R inguinal nodes, R axillo-inguinal anastomosis, lateral R brachium, medial to lateral brachium, lateral brachium, all surfaces of antebrachium, dorsum of the hand in R side-lying posterior inter-axillary anastomosis, then toward R axillo-inguinal anastomosis back to supine re-worked all surfaces and deep abdominals             PT Education - 05/12/19 1033    Education Details  Pt will perform HEP and self MLD at home. She was taught how to perform self MLD today with an emphasis on direction, skin stretch and pressure with VC, demonstration and hand over hand demonstration for correct pressure and direction.    Person(s) Educated  Patient    Methods  Explanation;Demonstration;Tactile cues;Handout;Verbal cues    Comprehension  Verbalized understanding;Returned demonstration       PT Short Term Goals - 05/10/19 0924      PT SHORT TERM GOAL #1   Title  Pt will be independent with initial HEP and MLD in order to demonstrate autonomy of care.    Baseline  pt does not have an HEP or know how to perform MLD    Time  2    Period  Weeks    Status  New    Target Date  05/31/19        PT Long Term Goals - 05/10/19 0924      PT LONG TERM GOAL #1   Title  Pt will demonstrate improvement in R shoulder ROM to 160 degrees flexion, 150 degrees abduction in order to demonstrate improve functional mobility.    Baseline  R shoulder flexion 153, R shoulder abduction 141    Time  4    Period  Weeks    Status  New    Target Date  06/14/19      PT LONG TERM GOAL #2   Title  Pt will report 50% improvement in function and pain in the R breast/R shoulder from her initial evaluation in order to demonstrate an improve quality of life to decrease risk for immobility.    Baseline  10/10 pain at the worst, every day intermittently, 3/10 pain base line.    Time  4    Period  Weeks    Status  New    Target Date  06/14/19      PT LONG TERM GOAL #3   Title  Pt will have  appropriate compression garments to wear on a dialy basis in order to decrease risk for increase edema.    Baseline  pt does not have compression garments.    Time  4    Period  Weeks    Status  New    Target Date  06/14/19      PT LONG TERM GOAL #4   Title  Pt will have 1 cm reduction in RUE circumferential measurements in the R distal brachium and R proximal antebrachium in order to demonstrate successful management of fluid at home.    Baseline  see measurements.    Time  4    Period  Weeks    Status  New    Target Date  06/14/19            Plan - 05/12/19 1009    Clinical Impression Statement  Pt presents to physical therapy with palpable tightness/tenderness noted at the R upper trapezius and deltoid; decreased minimally following light STM. Pt was educated on how to perform self MLD for the RUE and inferior breast with modifications due to Bil masectomy. MLD was performed following light STM to the RUE along the R axillo-inguinal anastomosis and along the posterior inter-axillary anastomosis. Pt will benefit from continued POC at this time.    Rehab Potential  Excellent    PT Frequency  2x / week    PT Duration  4 weeks    PT Treatment/Interventions  Iontophoresis 4mg /ml Dexamethasone;Therapeutic activities;Therapeutic exercise;Neuromuscular re-education;Manual techniques    PT Next Visit Plan  Assess self MLD, check for signed script, sign the alight form, Assess STM contine with MLD/STM and progress shouler exercises    PT Home Exercise Plan  Access Code: [email or web address]    Consulted and Agree with Plan of Care  Patient       Patient will benefit from skilled therapeutic intervention in order to improve the following deficits and impairments:  Decreased range of motion, Pain, Increased edema  Visit Diagnosis: Malignant neoplasm of lower-outer quadrant of right breast of female, estrogen receptor positive ([place])  Lymphedema, not elsewhere classified  Stiffness of right  shoulder, not elsewhere classified     Problem List Patient Active Problem List   Diagnosis Date Noted  . Family history of breast cancer   . Malignant neoplasm of lower-outer quadrant of right breast of female, estrogen receptor positive ([place]) 05/31/2014  . Essential hypertension 12/08/2013  . Dyspnea on exertion 12/08/2013    Ander Purpura, PT 05/12/2019, 11:10 AM  [place], [place], [address] Phone: [phone]   Fax:  [phone]  Name: Debbie Joseph MRN: [id number] Date of Birth: June 19, 1940

## 2019-05-12 NOTE — Patient Instructions (Signed)
Deep Effective Breath   Standing, sitting, or laying down, place both hands on the belly. Take a deep breath IN, expanding the belly; then breath OUT, contracting the belly. Repeat __5__ times. Do __2-3__ sessions per day and before your self massage.  Hug yourself and right below the collar bone cross your hands and make light circles 7-10 times.   http://gt2.exer.us/866   Copyright  VHI. All rights reserved.  Axillary nodes   On Both sides make 5 circles in the armpitAxilla to Inguinal Nodes    On both sides, make 5 circles at groin at panty line, then pump _5__ times from armpit along side of trunk to outer hip, making your other pathway. Do __1_ time per day.  Draw an imaginary line from R axillar to medial lower breast. On the bottom half of this line you will make down and out motions toward the pathway you just established 5x. Then repeat the last step form armpit to groin 5x.   Copyright  VHI. All rights reserved.  Arm Posterior: Elbow to Shoulder - Sweep   Pump _5__ times from back of elbow to top of shoulder. Then inner to outer upper arm _5_ times, then outer arm again _5_ times. Then back to the pathway from arm pit to groin _2-3_ times. Do _1__ time per day.  Copyright  VHI. All rights reserved.  ARM: [place] Wrist to Elbow - Sweep   Pump or stationary circles _5__ times from wrist to elbow making sure to do both sides of the forearm. Then retrace your steps to the outer arm, and the pathways _2-3_ times each. Do _1__ time per day.  Copyright  VHI. All rights reserved.  ARM: Dorsum of Hand to Shoulder - Sweep   Pump or stationary circles _5__ times on back of hand including knuckle spaces and individual fingers if needed working up towards the wrist, then retrace all your steps working back up the forearm, doing both sides; upper outer arm and back to your pathways _2-3_ times each. Then do 5 circles again at uninvolved armpit and involved groin where you  started! Good job!! Do __1_ time per day.  Copyright  VHI. All rights reserved.

## 2019-05-16 ENCOUNTER — Other Ambulatory Visit: Payer: Self-pay

## 2019-05-16 ENCOUNTER — Ambulatory Visit: Payer: Medicare Other | Admitting: Rehabilitation

## 2019-05-16 ENCOUNTER — Encounter: Payer: Self-pay | Admitting: Rehabilitation

## 2019-05-16 DIAGNOSIS — Z170 Estrogen receptor positive status [ER+]: Secondary | ICD-10-CM

## 2019-05-16 DIAGNOSIS — C50511 Malignant neoplasm of lower-outer quadrant of right female breast: Secondary | ICD-10-CM | POA: Diagnosis not present

## 2019-05-16 DIAGNOSIS — M25611 Stiffness of right shoulder, not elsewhere classified: Secondary | ICD-10-CM

## 2019-05-16 DIAGNOSIS — I890 Lymphedema, not elsewhere classified: Secondary | ICD-10-CM

## 2019-05-16 NOTE — Therapy (Signed)
[place], [place], [address] Phone: [phone]   Fax:  [phone]  Physical Therapy Treatment  Patient Details  Name: Debbie Joseph MRN: [id number] Date of Birth: 10-23-1940 Referring Provider (PT): Debbie Klein MD   Encounter Date: 05/16/2019  PT End of Session - 05/16/19 0956    Visit Number  3    Number of Visits  9    Date for PT Re-Evaluation  06/14/19    PT Start Time  0900    PT Stop Time  0954    PT Time Calculation (min)  54 min    Activity Tolerance  Patient tolerated treatment well    Behavior During Therapy  [facility] for tasks assessed/performed       Past Medical History:  Diagnosis Date  . Breast cancer ([place])    left breast  . Breast cancer, right breast ([place])   . Diabetes mellitus without complication ([place])   . Dyspnea on exertion   . Family history of adverse reaction to anesthesia    maternal aunt- slow to wake up   . Family history of breast cancer   . GERD (gastroesophageal reflux disease)   . Headache    hx of migraines greater than 20 years ago   . Hypertension   . Personal history of radiation therapy   . Radiation 07/04/14-08/22/14   right breast 50.4 Gy, lumpectomy cavity boosted to 10 Gy    Past Surgical History:  Procedure Laterality Date  . APPENDECTOMY  1974   with hernia  . BREAST BIOPSY    . BREAST LUMPECTOMY Bilateral    right 2016/ left 2002  . BREAST LUMPECTOMY WITH NEEDLE LOCALIZATION Right 05/08/2014   Procedure: BREAST LUMPECTOMY WITH NEEDLE LOCALIZATION;  Surgeon: Excell Seltzer, MD;  Location: [place];  Service: General;  Laterality: Right;  . BREAST SURGERY  2002   lt breast lump-cancer  . CATARACT EXTRACTION Bilateral 2009  . COLONOSCOPY    . dental inplant  2015  . PARTIAL HYSTERECTOMY  1983  . RE-EXCISION OF BREAST LUMPECTOMY Right 05/15/2014   Procedure: RE-EXCISION OF BREAST LUMPECTOMY SUPERIOR AND MEDIAL MARGINS;  Surgeon:  Excell Seltzer, MD;  Location: [facility];  Service: General;  Laterality: Right;  . UMBILICAL HERNIA REPAIR  1974    There were no vitals filed for this visit.  Subjective Assessment - 05/16/19 0859    Subjective  I tried to do my massage as much as possible. I think my shoulder is loosening up a bit. I was a bit sore after last time    Pertinent History  L breast cancer in 2002 w/lumpectomy and radiation. R breast cancer in 2015 with lumpectomy and radiation    Patient Stated Goals  I want to see if I can get some of this swelling out of my breast and decrease my pain.    Currently in Pain?  No/denies                  Outpatient Re[facility]  Lymphedema Life Impact Scale Total Score  14.71 %           [facility] Adult PT Treatment/Exercise - 05/16/19 0001      Exercises   Exercises  Shoulder      Shoulder Exercises: Seated   Retraction  Both;10 reps      Shoulder Exercises: Pulleys   Flexion  1 minute    Flexion Limitations  vcs for initial performance    ABduction Limitations  x10 with vcs with initial performance; stopping after a few due to difficulting isolated shoulder motion      Manual Therapy   Manual Therapy  Passive ROM    Soft tissue mobilization  very gentle to anterior shoulder and lateral deloid and into UT    Manual Lymphatic Drainage (MLD)  reveiwed MLD sequence per handout last session in supine and again in seated.  All steps performed and all steps needing moderate vcs and tcs for hand pressure and placement.  Work on decreasing pressure and not sliding over the skin; steps modified to eliminate interaxillary pathway per handout;  PT performance of some lateral/inferior breast MLD towards the axillo inguinal pathway    Passive ROM  of the Rt shoulder to tlolerance               PT Short Term Goals - 05/10/19 0924      PT SHORT TERM GOAL #1   Title  Pt will be independent with  initial HEP and MLD in order to demonstrate autonomy of care.    Baseline  pt does not have an HEP or know how to perform MLD    Time  2    Period  Weeks    Status  New    Target Date  05/31/19        PT Long Term Goals - 05/10/19 0924      PT LONG TERM GOAL #1   Title  Pt will demonstrate improvement in R shoulder ROM to 160 degrees flexion, 150 degrees abduction in order to demonstrate improve functional mobility.    Baseline  R shoulder flexion 153, R shoulder abduction 141    Time  4    Period  Weeks    Status  New    Target Date  06/14/19      PT LONG TERM GOAL #2   Title  Pt will report 50% improvement in function and pain in the R breast/R shoulder from her initial evaluation in order to demonstrate an improve quality of life to decrease risk for immobility.    Baseline  10/10 pain at the worst, every day intermittently, 3/10 pain base line.    Time  4    Period  Weeks    Status  New    Target Date  06/14/19      PT LONG TERM GOAL #3   Title  Pt will have appropriate compression garments to wear on a dialy basis in order to decrease risk for increase edema.    Baseline  pt does not have compression garments.    Time  4    Period  Weeks    Status  New    Target Date  06/14/19      PT LONG TERM GOAL #4   Title  Pt will have 1 cm reduction in RUE circumferential measurements in the R distal brachium and R proximal antebrachium in order to demonstrate successful management of fluid at home.    Baseline  see measurements.    Time  4    Period  Weeks    Status  New    Target Date  06/14/19            Plan - 05/16/19 0957    Clinical Impression Statement  Reviewed self Rt UE and breast MLD today with all steps reviewed by patient; some difficulty with pressure and not sliding but overall  able to make changes well.  Began more shoulder mobility today with pt having trouble isolating shoulder abduction from the scapula and with some impingement here.  Getting  measured on Friday for sleeve and bra    PT Frequency  2x / week    PT Duration  4 weeks    PT Treatment/Interventions  Iontophoresis 4mg /ml Dexamethasone;Therapeutic activities;Therapeutic exercise;Neuromuscular re-education;Manual techniques    PT Next Visit Plan  any more self MLD questions?, signed script for friday?, continue Rt breast MLD and start shoulder mobility and postural strengthening    PT Home Exercise Plan  Access Code: [email or web address]    Consulted and Agree with Plan of Care  Patient       Patient will benefit from skilled therapeutic intervention in order to improve the following deficits and impairments:     Visit Diagnosis: Malignant neoplasm of lower-outer quadrant of right breast of female, estrogen receptor positive ([place])  Lymphedema, not elsewhere classified  Stiffness of right shoulder, not elsewhere classified     Problem List Patient Active Problem List   Diagnosis Date Noted  . Family history of breast cancer   . Malignant neoplasm of lower-outer quadrant of right breast of female, estrogen receptor positive ([place]) 05/31/2014  . Essential hypertension 12/08/2013  . Dyspnea on exertion 12/08/2013    Debbie Joseph 05/16/2019, 9:59 AM  [place], [place], [address] Phone: [phone]   Fax:  [phone]  Name: Debbie Joseph MRN: [id number] Date of Birth: 23-Aug-1940

## 2019-05-18 ENCOUNTER — Ambulatory Visit: Payer: Medicare Other

## 2019-05-18 ENCOUNTER — Other Ambulatory Visit: Payer: Self-pay

## 2019-05-18 DIAGNOSIS — I890 Lymphedema, not elsewhere classified: Secondary | ICD-10-CM | POA: Diagnosis not present

## 2019-05-18 DIAGNOSIS — C50511 Malignant neoplasm of lower-outer quadrant of right female breast: Secondary | ICD-10-CM | POA: Diagnosis not present

## 2019-05-18 DIAGNOSIS — M25611 Stiffness of right shoulder, not elsewhere classified: Secondary | ICD-10-CM | POA: Diagnosis not present

## 2019-05-18 DIAGNOSIS — Z170 Estrogen receptor positive status [ER+]: Secondary | ICD-10-CM | POA: Diagnosis not present

## 2019-05-18 NOTE — Therapy (Signed)
[place], [place], [address] Phone: [phone]   Fax:  [phone]  Physical Therapy Treatment  Patient Details  Name: Debbie Joseph MRN: [id number] Date of Birth: Nov 06, 1940 Referring Provider (PT): Stark Klein MD   Encounter Date: 05/18/2019  PT End of Session - 05/18/19 1006    Visit Number  4    Number of Visits  9    Date for PT Re-Evaluation  06/14/19    PT Start Time  0901    PT Stop Time  1003    PT Time Calculation (min)  62 min    Activity Tolerance  Patient tolerated treatment well    Behavior During Therapy  [facility] for tasks assessed/performed       Past Medical History:  Diagnosis Date  . Breast cancer ([place])    left breast  . Breast cancer, right breast ([place])   . Diabetes mellitus without complication ([place])   . Dyspnea on exertion   . Family history of adverse reaction to anesthesia    maternal aunt- slow to wake up   . Family history of breast cancer   . GERD (gastroesophageal reflux disease)   . Headache    hx of migraines greater than 20 years ago   . Hypertension   . Personal history of radiation therapy   . Radiation 07/04/14-08/22/14   right breast 50.4 Gy, lumpectomy cavity boosted to 10 Gy    Past Surgical History:  Procedure Laterality Date  . APPENDECTOMY  1974   with hernia  . BREAST BIOPSY    . BREAST LUMPECTOMY Bilateral    right 2016/ left 2002  . BREAST LUMPECTOMY WITH NEEDLE LOCALIZATION Right 05/08/2014   Procedure: BREAST LUMPECTOMY WITH NEEDLE LOCALIZATION;  Surgeon: Excell Seltzer, MD;  Location: [place];  Service: General;  Laterality: Right;  . BREAST SURGERY  2002   lt breast lump-cancer  . CATARACT EXTRACTION Bilateral 2009  . COLONOSCOPY    . dental inplant  2015  . PARTIAL HYSTERECTOMY  1983  . RE-EXCISION OF BREAST LUMPECTOMY Right 05/15/2014   Procedure: RE-EXCISION OF BREAST LUMPECTOMY SUPERIOR AND MEDIAL MARGINS;  Surgeon:  Excell Seltzer, MD;  Location[facility];  Service: General;  Laterality: Right;  . UMBILICAL HERNIA REPAIR  1974    There were no vitals filed for this visit.  Subjective Assessment - 05/18/19 0904    Subjective  I'm doing okay with the massage, still probably need more review. My Rt shoulder is feeling pretty good today, I can tell the A/ROM is improving.    Pertinent History  L breast cancer in 2002 w/lumpectomy and radiation. R breast cancer in 2015 with lumpectomy and radiation    Patient Stated Goals  I want to see if I can get some of this swelling out of my breast and decrease my pain.    Currently in Pain?  No/denies                  Outpatient Rehab from [facility]/10/2019 in [facility]  Lymphedema Life Impact Scale Total Score  14.71 %           [facility] Adult PT Treatment/Exercise - 05/18/19 0001      Shoulder Exercises: Supine   Horizontal ABduction  Strengthening;Both;5 reps;Theraband    Theraband Level (Shoulder Horizontal ABduction)  Level 1 (Yellow)    External Rotation  Strengthening;Both;5 reps;Theraband    Theraband Level (Shoulder External Rotation)  Level  1 (Yellow)    External Rotation Limitations  tactile cuing for technique and full A/ROM    Flexion  Strengthening;Both;5 reps;Theraband   Narrow and Wide Grip, 5 times each   Theraband Level (Shoulder Flexion)  Level 1 (Yellow)    Diagonals  Strengthening;Right;Left;5 reps;Theraband    Theraband Level (Shoulder Diagonals)  Level 1 (Yellow)    Diagonals Limitations  Pt returned demo of each above exercise       Shoulder Exercises: Pulleys   Flexion  2 minutes    Flexion Limitations  VCs to relax shoulders    ABduction  2 minutes    ABduction Limitations  Pt with better technqiue today, able to decrease scapular compensations      Shoulder Exercises: Therapy Ball   Flexion  Both;10 reps   forward lean into end of stretch     Manual Therapy   Manual Lymphatic  Drainage (MLD)  Reviewing with pt while performing and having herreturn demo of each step using hand over hand pressure for correct light pressure: In Supine: short neck, superficial abdominals and 5 diaphragmatic breaths (review of technique with deep breathing), Rt inguinal nodes, Rt axillo-inguinal anastomosis, Rt breast, and then Rt UE working from lateral upper arm to dorsum of hand then retracing steps of UE retracing to anastomosis, then into Lt S/L for further work to lateral breast and further instruction to pt here, redirecting to same anastomosis, and then retraced steps agin finishing in supine.    Passive ROM  In Supine to Rt shoulder into flexion, abduction and D2 to pts tolerance             PT Education - 05/18/19 0922    Education Details  Supine scapular series with yellow theraband    Person(s) Educated  Patient    Methods  Explanation;Demonstration;Handout    Comprehension  Verbalized understanding;Returned demonstration;Need further instruction       PT Short Term Goals - 05/10/19 [id number]      PT SHORT TERM GOAL #1   Title  Pt will be independent with initial HEP and MLD in order to demonstrate autonomy of care.    Baseline  pt does not have an HEP or know how to perform MLD    Time  2    Period  Weeks    Status  New    Target Date  05/31/19        PT Long Term Goals - 05/10/19 0924      PT LONG TERM GOAL #1   Title  Pt will demonstrate improvement in R shoulder ROM to 160 degrees flexion, 150 degrees abduction in order to demonstrate improve functional mobility.    Baseline  R shoulder flexion 153, R shoulder abduction 141    Time  4    Period  Weeks    Status  New    Target Date  06/14/19      PT LONG TERM GOAL #2   Title  Pt will report 50% improvement in function and pain in the R breast/R shoulder from her initial evaluation in order to demonstrate an improve quality of life to decrease risk for immobility.    Baseline  10/10 pain at the worst, every  day intermittently, 3/10 pain base line.    Time  4    Period  Weeks    Status  New    Target Date  06/14/19      PT LONG TERM GOAL #3   Title  Pt  will have appropriate compression garments to wear on a dialy basis in order to decrease risk for increase edema.    Baseline  pt does not have compression garments.    Time  4    Period  Weeks    Status  New    Target Date  06/14/19      PT LONG TERM GOAL #4   Title  Pt will have 1 cm reduction in RUE circumferential measurements in the R distal brachium and R proximal antebrachium in order to demonstrate successful management of fluid at home.    Baseline  see measurements.    Time  4    Period  Weeks    Status  New    Target Date  06/14/19            Plan - 05/18/19 1015    Clinical Impression Statement  Continued with AA/ROM and pt was able to return better techinque with less scapular compensation today. Progressed HEP to include supine scapular series as pt tolerated these well. Continued with performing while reviewing with pt manual lymph drainage and by end of session she was able to report she felt better about her technique. She was also demonstrating lighter pressure and able to verbalize better understanding of sequence of MLD.    Stability/Clinical Decision Making  Stable/Uncomplicated    Rehab Potential  Excellent    PT Frequency  2x / week    PT Duration  4 weeks    PT Treatment/Interventions  Iontophoresis 4mg /ml Dexamethasone;Therapeutic activities;Therapeutic exercise;Neuromuscular re-education;Manual techniques    PT Next Visit Plan  any more self MLD questions?, signed script for friday?, continue Rt breast MLD (reviewing with pt prn) and shoulder mobility and postural strengthening (review new HEP issued today)    PT Home Exercise Plan  Access Code: [email or web address]; supine scapular series    Consulted and Agree with Plan of Care  Patient       Patient will benefit from skilled therapeutic intervention in order to  improve the following deficits and impairments:  Decreased range of motion, Pain, Increased edema  Visit Diagnosis: Malignant neoplasm of lower-outer quadrant of right breast of female, estrogen receptor positive ([place])  Lymphedema, not elsewhere classified  Stiffness of right shoulder, not elsewhere classified     Problem List Patient Active Problem List   Diagnosis Date Noted  . Family history of breast cancer   . Malignant neoplasm of lower-outer quadrant of right breast of female, estrogen receptor positive ([place]) 05/31/2014  . Essential hypertension 12/08/2013  . Dyspnea on exertion 12/08/2013    Otelia Limes, PTA 05/18/2019, 10:29 AM  [place], [place], [address] Phone: [phone]   Fax:  [phone]  Name: MEKHIA CASAL MRN: [id number] Date of Birth: [birth year]

## 2019-05-18 NOTE — Patient Instructions (Signed)

## 2019-05-23 ENCOUNTER — Ambulatory Visit: Payer: Medicare Other | Attending: General Surgery

## 2019-05-23 ENCOUNTER — Other Ambulatory Visit: Payer: Self-pay

## 2019-05-23 DIAGNOSIS — M25611 Stiffness of right shoulder, not elsewhere classified: Secondary | ICD-10-CM | POA: Diagnosis not present

## 2019-05-23 DIAGNOSIS — C50511 Malignant neoplasm of lower-outer quadrant of right female breast: Secondary | ICD-10-CM | POA: Diagnosis not present

## 2019-05-23 DIAGNOSIS — I890 Lymphedema, not elsewhere classified: Secondary | ICD-10-CM | POA: Insufficient documentation

## 2019-05-23 DIAGNOSIS — Z170 Estrogen receptor positive status [ER+]: Secondary | ICD-10-CM | POA: Diagnosis not present

## 2019-05-23 NOTE — Therapy (Signed)
[place], [place], [address] Phone: [phone]   Fax:  [phone]  Physical Therapy Treatment  Patient Details  Name: Debbie Joseph MRN: [id number] Date of Birth: 1940/07/21 Referring Provider (PT): Stark Klein MD   Encounter Date: 05/23/2019  PT End of Session - 05/23/19 1305    Visit Number  5    Number of Visits  9    Date for PT Re-Evaluation  06/14/19    PT Start Time  [id number]    PT Stop Time  1359    PT Time Calculation (min)  54 min    Activity Tolerance  Patient tolerated treatment well    Behavior During Therapy  [facility] for tasks assessed/performed       Past Medical History:  Diagnosis Date  . Breast cancer ([place])    left breast  . Breast cancer, right breast ([place])   . Diabetes mellitus without complication ([place])   . Dyspnea on exertion   . Family history of adverse reaction to anesthesia    maternal aunt- slow to wake up   . Family history of breast cancer   . GERD (gastroesophageal reflux disease)   . Headache    hx of migraines greater than 20 years ago   . Hypertension   . Personal history of radiation therapy   . Radiation 07/04/14-08/22/14   right breast 50.4 Gy, lumpectomy cavity boosted to 10 Gy    Past Surgical History:  Procedure Laterality Date  . APPENDECTOMY  1974   with hernia  . BREAST BIOPSY    . BREAST LUMPECTOMY Bilateral    right 2016/ left 2002  . BREAST LUMPECTOMY WITH NEEDLE LOCALIZATION Right 05/08/2014   Procedure: BREAST LUMPECTOMY WITH NEEDLE LOCALIZATION;  Surgeon: Excell Seltzer, MD;  Location: [place];  Service: General;  Laterality: Right;  . BREAST SURGERY  2002   lt breast lump-cancer  . CATARACT EXTRACTION Bilateral 2009  . COLONOSCOPY    . dental inplant  2015  . PARTIAL HYSTERECTOMY  1983  . RE-EXCISION OF BREAST LUMPECTOMY Right 05/15/2014   Procedure: RE-EXCISION OF BREAST LUMPECTOMY SUPERIOR AND MEDIAL MARGINS;  Surgeon:  Excell Seltzer, MD;  Location[facility];  Service: General;  Laterality: Right;  . UMBILICAL HERNIA REPAIR  1974    There were no vitals filed for this visit.  Subjective Assessment - 05/23/19 1305    Subjective  Pt reports that she is progressing with physical therapy and can move her arm much better without pain. She states that she is feeling comfortable performing MLD after her last session.    Pertinent History  L breast cancer in 2002 w/lumpectomy and radiation. R breast cancer in 2015 with lumpectomy and radiation    Patient Stated Goals  I want to see if I can get some of this swelling out of my breast and decrease my pain.    Currently in Pain?  Yes    Pain Score  3     Pain Location  Breast    Pain Orientation  Right    Pain Descriptors / Indicators  Aching    Pain Onset  More than a month ago                  Outpatient Rehab [facility]10/[facility]  Lymphedema Life Impact Scale Total Score  14.71 %           [facility] Adult PT Treatment/Exercise -  05/23/19 0001      Shoulder Exercises: Supine   Horizontal ABduction  Strengthening;Both;5 reps;Theraband    Theraband Level (Shoulder Horizontal ABduction)  Level 1 (Yellow)    Horizontal ABduction Limitations  VC for slow movement to prevent compensation w/momentum and for scapular squeeze.     External Rotation  Strengthening;Both;5 reps;Theraband    Theraband Level (Shoulder External Rotation)  Level 1 (Yellow)    External Rotation Limitations  VC to keep elbows tucked to prevent compensation w/abduction and to squeeze shoulder blades    Flexion  Strengthening;Both;5 reps;Theraband   Narrow and Wide Grip, 5 times each   Theraband Level (Shoulder Flexion)  Level 1 (Yellow)    Flexion Limitations  VC for direction of thumbs to prevent impingment and to maintain resistance throughout.     Diagonals  Strengthening;Right;Left;5 reps;Theraband    Theraband Level (Shoulder  Diagonals)  Level 1 (Yellow)    Other Supine Exercises  Supine shoulder flexion w/dowel for stretch following STM with good movement      Manual Therapy   Manual Therapy  Passive ROM;Soft tissue mobilization;Manual Lymphatic Drainage (MLD)    Soft tissue mobilization  STM to the R deltoid, supraspinatus, pectoralis major and biceps; decreased moderately following light STM.     Manual Lymphatic Drainage (MLD)  In supine following STM: short neck, swimming in the terminus, Bil shoulders, Bil axillary and R inguinal nodes, inferior R breast, superior R breast working towards the inferior anastomosis and the R axilla then re-worked the R axillo-inguinal anastomosis w/extra time spent here, medial to lateral R brachium, lateral R brachium, all surfaces of R antebrachium, dorsum of the R hand;re-worked all surfaces    Passive ROM  In supine R shoulder abduction and flexion with myofascial release at the lateral R breast at the thoracic wall during abduction and TpR at the R upper trap during flexion w/good improvement in pain-free movement.              PT Education - 05/23/19 1354    Education Details  Pt will continue with self MLD and scapular series at home.    Person(s) Educated  Patient    Methods  Explanation    Comprehension  Verbalized understanding       PT Short Term Goals - 05/10/19 0924      PT SHORT TERM GOAL #1   Title  Pt will be independent with initial HEP and MLD in order to demonstrate autonomy of care.    Baseline  pt does not have an HEP or know how to perform MLD    Time  2    Period  Weeks    Status  New    Target Date  05/31/19        PT Long Term Goals - 05/10/19 0924      PT LONG TERM GOAL #1   Title  Pt will demonstrate improvement in R shoulder ROM to 160 degrees flexion, 150 degrees abduction in order to demonstrate improve functional mobility.    Baseline  R shoulder flexion 153, R shoulder abduction 141    Time  4    Period  Weeks    Status  New     Target Date  06/14/19      PT LONG TERM GOAL #2   Title  Pt will report 50% improvement in function and pain in the R breast/R shoulder from her initial evaluation in order to demonstrate an improve quality of life to decrease risk  for immobility.    Baseline  10/10 pain at the worst, every day intermittently, 3/10 pain base line.    Time  4    Period  Weeks    Status  New    Target Date  06/14/19      PT LONG TERM GOAL #3   Title  Pt will have appropriate compression garments to wear on a dialy basis in order to decrease risk for increase edema.    Baseline  pt does not have compression garments.    Time  4    Period  Weeks    Status  New    Target Date  06/14/19      PT LONG TERM GOAL #4   Title  Pt will have 1 cm reduction in RUE circumferential measurements in the R distal brachium and R proximal antebrachium in order to demonstrate successful management of fluid at home.    Baseline  see measurements.    Time  4    Period  Weeks    Status  New    Target Date  06/14/19            Plan - 05/23/19 1304    Clinical Impression Statement  Palpable tightness/tenderness noted at the R biceps, upper trapezius, supraspinatus and levator scap this session; decreased moderately following STM. Myofascial release at the lateral base of the R breast during abduction and TpR at the distal Upper trapezius on the R during P/ROM flexion with significant improvement in pain-free mobility following manual work. MLD was performed following STM in order to decrease risk for increased edema with extra time spent at the cervical nodes, at the R axillo-inguinal anastomosis. Pt was able to perform scapular series with only occasional cueing for correct movement. Pt will benefit from continued POC at this time.    Rehab Potential  Excellent    PT Frequency  2x / week    PT Duration  4 weeks    PT Treatment/Interventions  Iontophoresis 4mg /ml Dexamethasone;Therapeutic activities;Therapeutic  exercise;Neuromuscular re-education;Manual techniques    PT Next Visit Plan  any more self MLD questions?, signed script for friday?, continue Rt breast MLD (reviewing with pt prn) and shoulder mobility and postural strengthening (review new HEP issued today)    PT Home Exercise Plan  Access Code: [email or web address]; supine scapular series    Consulted and Agree with Plan of Care  Patient       Patient will benefit from skilled therapeutic intervention in order to improve the following deficits and impairments:  Decreased range of motion, Pain, Increased edema  Visit Diagnosis: Malignant neoplasm of lower-outer quadrant of right breast of female, estrogen receptor positive ([place])  Lymphedema, not elsewhere classified  Stiffness of right shoulder, not elsewhere classified     Problem List Patient Active Problem List   Diagnosis Date Noted  . Family history of breast cancer   . Malignant neoplasm of lower-outer quadrant of right breast of female, estrogen receptor positive ([place]) 05/31/2014  . Essential hypertension 12/08/2013  . Dyspnea on exertion 12/08/2013    Ander Purpura, PT 05/23/2019, 2:00 PM  [place], [place], [address] Phone: [phone]   Fax:  [phone]  Name: Debbie Joseph MRN: [id number] Date of Birth: [birth year]

## 2019-05-24 ENCOUNTER — Ambulatory Visit: Payer: Medicare Other | Attending: Internal Medicine

## 2019-05-24 DIAGNOSIS — Z23 Encounter for immunization: Secondary | ICD-10-CM

## 2019-05-24 NOTE — Progress Notes (Signed)
   Covid-19 Vaccination Clinic  Name:  Debbie Joseph    MRN: [id number] DOB: 1941/02/09  05/24/2019  [name] was observed post Covid-19 immunization for 15 minutes without incidence. She was provided with Vaccine Information Sheet and instruction to access the V-Safe system.   [name] was instructed to call 911 with any severe reactions post vaccine: [name] Difficulty breathing  . Swelling of your face and throat  . A fast heartbeat  . A bad rash all over your body  . Dizziness and weakness    Immunizations Administered    Name Date Dose VIS Date Route   Pfizer COVID-19 Vaccine 05/24/2019  9:25 AM 0.3 mL 04/01/2019 Intramuscular   Manufacturer: [place]   Lot: [id number]   [place]: [id number]

## 2019-05-26 ENCOUNTER — Other Ambulatory Visit: Payer: Self-pay | Admitting: Oncology

## 2019-05-26 ENCOUNTER — Other Ambulatory Visit: Payer: Self-pay

## 2019-05-26 ENCOUNTER — Ambulatory Visit: Payer: Medicare Other

## 2019-05-26 DIAGNOSIS — M25611 Stiffness of right shoulder, not elsewhere classified: Secondary | ICD-10-CM

## 2019-05-26 DIAGNOSIS — I890 Lymphedema, not elsewhere classified: Secondary | ICD-10-CM | POA: Diagnosis not present

## 2019-05-26 DIAGNOSIS — Z170 Estrogen receptor positive status [ER+]: Secondary | ICD-10-CM | POA: Diagnosis not present

## 2019-05-26 DIAGNOSIS — Z853 Personal history of malignant neoplasm of breast: Secondary | ICD-10-CM

## 2019-05-26 DIAGNOSIS — C50511 Malignant neoplasm of lower-outer quadrant of right female breast: Secondary | ICD-10-CM

## 2019-05-26 NOTE — Therapy (Signed)
[place], [place], [address] Phone: [phone]   Fax:  [phone]  Physical Therapy Treatment  Patient Details  Name: Debbie Joseph MRN: [id number] Date of Birth: July 22, 1940 Referring Provider (PT): Stark Klein MD   Encounter Date: 05/26/2019  PT End of Session - 05/26/19 1210    Visit Number  6    Number of Visits  9    Date for PT Re-Evaluation  06/14/19    PT Start Time  1106    PT Stop Time  1204    PT Time Calculation (min)  58 min    Activity Tolerance  Patient tolerated treatment well    Behavior During Therapy  [facility] for tasks assessed/performed       Past Medical History:  Diagnosis Date  . Breast cancer ([place])    left breast  . Breast cancer, right breast ([place])   . Diabetes mellitus without complication ([place])   . Dyspnea on exertion   . Family history of adverse reaction to anesthesia    maternal aunt- slow to wake up   . Family history of breast cancer   . GERD (gastroesophageal reflux disease)   . Headache    hx of migraines greater than 20 years ago   . Hypertension   . Personal history of radiation therapy   . Radiation 07/04/14-08/22/14   right breast 50.4 Gy, lumpectomy cavity boosted to 10 Gy    Past Surgical History:  Procedure Laterality Date  . APPENDECTOMY  1974   with hernia  . BREAST BIOPSY    . BREAST LUMPECTOMY Bilateral    right 2016/ left 2002  . BREAST LUMPECTOMY WITH NEEDLE LOCALIZATION Right 05/08/2014   Procedure: BREAST LUMPECTOMY WITH NEEDLE LOCALIZATION;  Surgeon: Excell Seltzer, MD;  Location: [place];  Service: General;  Laterality: Right;  . BREAST SURGERY  2002   lt breast lump-cancer  . CATARACT EXTRACTION Bilateral 2009  . COLONOSCOPY    . dental inplant  2015  . PARTIAL HYSTERECTOMY  1983  . RE-EXCISION OF BREAST LUMPECTOMY Right 05/15/2014   Procedure: RE-EXCISION OF BREAST LUMPECTOMY SUPERIOR AND MEDIAL MARGINS;  Surgeon:  Excell Seltzer, MD;  Location[facility];  Service: General;  Laterality: Right;  . UMBILICAL HERNIA REPAIR  1974    There were no vitals filed for this visit.  Subjective Assessment - 05/26/19 1112    Subjective  My husband and I had our second vaccine Tuesday and we did well. Was maybe a little tired but that's all. My Rt breast is doing really good, it's my shoulder that's been bothering me. I think I pushed too hard when I was doing the "sword" exericse yesterday. It just feels real acht today.    Pertinent History  L breast cancer in 2002 w/lumpectomy and radiation. R breast cancer in 2015 with lumpectomy and radiation    Patient Stated Goals  I want to see if I can get some of this swelling out of my breast and decrease my pain.    Currently in Pain?  No/denies   5/10                 Outpatient Rehab from 05/10/2019 in [place]  Lymphedema Life Impact Scale Total Score  14.71 %           [facility] Adult PT Treatment/Exercise - 05/26/19 0001      Self-Care   Self-Care  Other Self-Care Comments  Other Self-Care Comments   With demonstration answered pts questions about working on lateral breast with manal lymph drainage, and then demonstrated correct technique with D2 that pt was reporting having pain with.       Manual Therapy   Soft tissue mobilization  STM to the R deltoid, supraspinatus, pectoralis major and biceps; decreased moderately following light STM.     Passive ROM  In supine R shoulder abduction and flexion with myofascial release at the lateral R breast at the thoracic wall during abduction and TpR at the R upper trap during flexion w/good improvement in pain-free movement.                PT Short Term Goals - 05/10/19 [id number]      PT SHORT TERM GOAL #1   Title  Pt will be independent with initial HEP and MLD in order to demonstrate autonomy of care.    Baseline  pt does not have an HEP or know how to perform  MLD    Time  2    Period  Weeks    Status  New    Target Date  05/31/19        PT Long Term Goals - 05/10/19 0924      PT LONG TERM GOAL #1   Title  Pt will demonstrate improvement in R shoulder ROM to 160 degrees flexion, 150 degrees abduction in order to demonstrate improve functional mobility.    Baseline  R shoulder flexion 153, R shoulder abduction 141    Time  4    Period  Weeks    Status  New    Target Date  06/14/19      PT LONG TERM GOAL #2   Title  Pt will report 50% improvement in function and pain in the R breast/R shoulder from her initial evaluation in order to demonstrate an improve quality of life to decrease risk for immobility.    Baseline  10/10 pain at the worst, every day intermittently, 3/10 pain base line.    Time  4    Period  Weeks    Status  New    Target Date  06/14/19      PT LONG TERM GOAL #3   Title  Pt will have appropriate compression garments to wear on a dialy basis in order to decrease risk for increase edema.    Baseline  pt does not have compression garments.    Time  4    Period  Weeks    Status  New    Target Date  06/14/19      PT LONG TERM GOAL #4   Title  Pt will have 1 cm reduction in RUE circumferential measurements in the R distal brachium and R proximal antebrachium in order to demonstrate successful management of fluid at home.    Baseline  see measurements.    Time  4    Period  Weeks    Status  New    Target Date  06/14/19            Plan - 05/26/19 1216    Clinical Impression Statement  Pts tenderness was much improved today and by end of session her Lt shoulder P/ROM was near full without c/o pain. Spent time at beginning of session answering pts questions regarding correct direction of stretch at lateral breast and demonstrated this for her on myself to show lateral breast horizontal, to vertical at anastmosis. she verbalized good understanding  after demonstration. Then pt demonstrated D2 from new HEP reporting  this caused increased achiness in her shoulder today so instructed her to limit her ROM with each exercise to only working within painfree ROM, and if painful throughout then place that particular exercise on hold. Pt able to verbalize good understanding of all. She wanted focus to Rt shoulder today as she reports her breast MLD feeling very controlled and minimal as of late.    Stability/Clinical Decision Making  Stable/Uncomplicated    Rehab Potential  Excellent    PT Frequency  2x / week    PT Duration  4 weeks    PT Treatment/Interventions  Iontophoresis 4mg /ml Dexamethasone;Therapeutic activities;Therapeutic exercise;Neuromuscular re-education;Manual techniques    PT Next Visit Plan  any more self MLD questions?, signed script for friday?, continue Rt breast MLD (reviewing with pt prn) and shoulder mobility and postural strengthening    PT Home Exercise Plan  Access Code: [email or web address]; supine scapular series    Consulted and Agree with Plan of Care  Patient       Patient will benefit from skilled therapeutic intervention in order to improve the following deficits and impairments:  Decreased range of motion, Pain, Increased edema  Visit Diagnosis: Malignant neoplasm of lower-outer quadrant of right breast of female, estrogen receptor positive ([place])  Lymphedema, not elsewhere classified  Stiffness of right shoulder, not elsewhere classified     Problem List Patient Active Problem List   Diagnosis Date Noted  . Family history of breast cancer   . Malignant neoplasm of lower-outer quadrant of right breast of female, estrogen receptor positive ([place]) 05/31/2014  . Essential hypertension 12/08/2013  . Dyspnea on exertion 12/08/2013    Otelia Limes, PTA 05/26/2019, 12:23 PM  [place], [place], [address] Phone: [phone]   Fax:  [phone]  Name: SINDHUJA BOLSTAD MRN: [id number] Date of Birth:  February 11, 1941

## 2019-05-27 ENCOUNTER — Other Ambulatory Visit: Payer: Self-pay | Admitting: Family Medicine

## 2019-05-30 ENCOUNTER — Other Ambulatory Visit: Payer: Self-pay | Admitting: Oncology

## 2019-05-30 ENCOUNTER — Ambulatory Visit
Admission: RE | Admit: 2019-05-30 | Discharge: 2019-05-30 | Disposition: A | Discharge status: Home / Self Care | Payer: Medicare Other | Source: Ambulatory Visit | Attending: Oncology | Admitting: Oncology

## 2019-05-30 ENCOUNTER — Ambulatory Visit
Admission: RE | Admit: 2019-05-30 | Discharge: 2019-05-30 | Disposition: A | Discharge status: Home / Self Care | Payer: Medicare Other | Source: Ambulatory Visit | Attending: Family Medicine | Admitting: Family Medicine

## 2019-05-30 ENCOUNTER — Other Ambulatory Visit: Payer: Self-pay

## 2019-05-30 ENCOUNTER — Ambulatory Visit: Payer: Medicare Other

## 2019-05-30 DIAGNOSIS — Z853 Personal history of malignant neoplasm of breast: Secondary | ICD-10-CM | POA: Diagnosis not present

## 2019-05-30 DIAGNOSIS — N6320 Unspecified lump in the left breast, unspecified quadrant: Secondary | ICD-10-CM | POA: Diagnosis not present

## 2019-05-30 DIAGNOSIS — I890 Lymphedema, not elsewhere classified: Secondary | ICD-10-CM

## 2019-05-30 DIAGNOSIS — M25611 Stiffness of right shoulder, not elsewhere classified: Secondary | ICD-10-CM

## 2019-05-30 DIAGNOSIS — C50511 Malignant neoplasm of lower-outer quadrant of right female breast: Secondary | ICD-10-CM | POA: Diagnosis not present

## 2019-05-30 DIAGNOSIS — Z170 Estrogen receptor positive status [ER+]: Secondary | ICD-10-CM | POA: Diagnosis not present

## 2019-05-30 DIAGNOSIS — R922 Inconclusive mammogram: Secondary | ICD-10-CM | POA: Diagnosis not present

## 2019-05-30 NOTE — Therapy (Addendum)
[place], [place], [address] Phone: [phone]   Fax:  [phone]  Physical Therapy Treatment  Patient Details  Name: Debbie Joseph MRN: [id number] Date of Birth: Apr 30, 1940 Referring Provider (PT): Stark Klein MD   Encounter Date: 05/30/2019  PT End of Session - 05/30/19 1217    Visit Number  7    Number of Visits  9    Date for PT Re-Evaluation  06/14/19    PT Start Time  1102    PT Stop Time  1202    PT Time Calculation (min)  60 min    Activity Tolerance  Patient tolerated treatment well    Behavior During Therapy  [facility] for tasks assessed/performed       Past Medical History:  Diagnosis Date  . Breast cancer ([place])    left breast  . Breast cancer, right breast ([place])   . Diabetes mellitus without complication ([place])   . Dyspnea on exertion   . Family history of adverse reaction to anesthesia    maternal aunt- slow to wake up   . Family history of breast cancer   . GERD (gastroesophageal reflux disease)   . Headache    hx of migraines greater than 20 years ago   . Hypertension   . Personal history of radiation therapy   . Radiation 07/04/14-08/22/14   right breast 50.4 Gy, lumpectomy cavity boosted to 10 Gy    Past Surgical History:  Procedure Laterality Date  . APPENDECTOMY  1974   with hernia  . BREAST BIOPSY    . BREAST LUMPECTOMY Bilateral    right 2016/ left 2002  . BREAST LUMPECTOMY WITH NEEDLE LOCALIZATION Right 05/08/2014   Procedure: BREAST LUMPECTOMY WITH NEEDLE LOCALIZATION;  Surgeon: Excell Seltzer, MD;  Location: [place];  Service: General;  Laterality: Right;  . BREAST SURGERY  2002   lt breast lump-cancer  . CATARACT EXTRACTION Bilateral 2009  . COLONOSCOPY    . dental inplant  2015  . PARTIAL HYSTERECTOMY  1983  . RE-EXCISION OF BREAST LUMPECTOMY Right 05/15/2014   Procedure: RE-EXCISION OF BREAST LUMPECTOMY SUPERIOR AND MEDIAL MARGINS;  Surgeon:  Excell Seltzer, MD;  Location[facility];  Service: General;  Laterality: Right;  . UMBILICAL HERNIA REPAIR  1974    There were no vitals filed for this visit.  Subjective Assessment - 05/30/19 1108    Subjective  I just had my mammogram this morning and my Rt side looked great, but at my Lt axillary lymph nodes Dr. Joneen Caraway saw an area he didn't like so they are going to biopsy that spot tomorrow. But I also had my second COVID vaccine last week (Tuesday) and he said maybe my lymph nodes are flared up from that.    Pertinent History  L breast cancer in 2002 w/lumpectomy and radiation. R breast cancer in 2015 with lumpectomy and radiation    Patient Stated Goals  I want to see if I can get some of this swelling out of my breast and decrease my pain.    Currently in Pain?  Yes    Pain Score  2     Pain Location  Shoulder    Pain Orientation  Right    Pain Descriptors / Indicators  Aching    Pain Type  Chronic pain    Pain Onset  More than a month ago    Pain Frequency  Intermittent    Aggravating Factors  reaching quickly    Pain Relieving Factors  compression         [facility] PT Assessment - 05/30/19 0001      AROM   Right Shoulder Flexion  164 Degrees    Right Shoulder ABduction  166 Degrees    Right Shoulder Internal Rotation  60 Degrees              Outpatient Re[facility]  Lymphedema Life Impact Scale Total Score  14.71 %           [facility] Adult PT Treatment/Exercise - 05/30/19 0001      Shoulder Exercises: Pulleys   Flexion  2 minutes    Flexion Limitations  VCs to relax shoulders    ABduction  2 minutes    ABduction Limitations  Bried tactile cues to decrease scap compensation, then returned good technique      Shoulder Exercises: Therapy Ball   Flexion  Both;5 reps   forward lean into end of stretch   Flexion Limitations  pt reports feeling minimal stretch    ABduction  Right;10 reps   same side lean  into end of stretch     Shoulder Exercises: Stretch   Wall Stretch - ABduction  5 reps   5 sec holds, back against wall for "snow angel"   Wall Stretch - ABduction Limitations  VCs throughout to keep hands on wall      Manual Therapy   Manual Therapy  Soft tissue mobilization;Myofascial release;Manual Lymphatic Drainage (MLD);Passive ROM    Soft tissue mobilization  STM to the R deltoid and upper trap, as well as supraspinatus    Myofascial Release  To Rt axilla during P/ROM, and lateral trunk at end passive abduction    Manual Lymphatic Drainage (MLD)  In Supine: Short neck, 5 diaphragmatic breaths, Rt inguinal nodes, and rt axillo-inguinal anastomosis, then briefly focused on lateral breast redirecting towards anastomosis.    Passive ROM  In supine R shoulder abduction and flexion to tolerance               PT Short Term Goals - 05/10/19 0924      PT SHORT TERM GOAL #1   Title  Pt will be independent with initial HEP and MLD in order to demonstrate autonomy of care.    Baseline  pt does not have an HEP or know how to perform MLD    Time  2    Period  Weeks    Status  New    Target Date  05/31/19        PT Long Term Goals - 05/10/19 0924      PT LONG TERM GOAL #1   Title  Pt will demonstrate improvement in R shoulder ROM to 160 degrees flexion, 150 degrees abduction in order to demonstrate improve functional mobility.    Baseline  R shoulder flexion 153, R shoulder abduction 141    Time  4    Period  Weeks    Status  New    Target Date  06/14/19      PT LONG TERM GOAL #2   Title  Pt will report 50% improvement in function and pain in the R breast/R shoulder from her initial evaluation in order to demonstrate an improve quality of life to decrease risk for immobility.    Baseline  10/10 pain at the worst, every day intermittently, 3/10 pain base line.    Time  4  Period  Weeks    Status  New    Target Date  06/14/19      PT LONG TERM GOAL #3   Title  Pt will  have appropriate compression garments to wear on a dialy basis in order to decrease risk for increase edema.    Baseline  pt does not have compression garments.    Time  4    Period  Weeks    Status  New    Target Date  06/14/19      PT LONG TERM GOAL #4   Title  Pt will have 1 cm reduction in RUE circumferential measurements in the R distal brachium and R proximal antebrachium in order to demonstrate successful management of fluid at home.    Baseline  see measurements.    Time  4    Period  Weeks    Status  New    Target Date  06/14/19            Plan - 05/30/19 1218    Clinical Impression Statement  Pt tolerated AA/ROM exercises well, though requires tactile cuing to decrease scapular compensation with most activities. Continued with manual therapy working to decrease achiness she reports in upper arm to upper trap area. Her P/ROM seemes improved today from last visit. Pt reports achiness improved by end of session. Pt reports compression bra has arrived, she hasn't been able to try it on yet.    Stability/Clinical Decision Making  Stable/Uncomplicated    Rehab Potential  Excellent    PT Frequency  2x / week    PT Duration  4 weeks    PT Treatment/Interventions  Iontophoresis 4mg /ml Dexamethasone;Therapeutic activities;Therapeutic exercise;Neuromuscular re-education;Manual techniques    PT Next Visit Plan  Pt would like to renew for 1x/wk after this week. Compression bra good fit? continue Rt breast MLD (reviewing with pt prn) and shoulder mobility and postural strengthening, try forearms on wall activities    PT Home Exercise Plan  Access Code: [email or web address]; supine scapular series    Consulted and Agree with Plan of Care  Patient       Patient will benefit from skilled therapeutic intervention in order to improve the following deficits and impairments:  Decreased range of motion, Pain, Increased edema  Visit Diagnosis: Malignant neoplasm of lower-outer quadrant of right breast  of female, estrogen receptor positive ([place])  Lymphedema, not elsewhere classified  Stiffness of right shoulder, not elsewhere classified     Problem List Patient Active Problem List   Diagnosis Date Noted  . Family history of breast cancer   . Malignant neoplasm of lower-outer quadrant of right breast of female, estrogen receptor positive ([place]) 05/31/2014  . Essential hypertension 12/08/2013  . Dyspnea on exertion 12/08/2013    Otelia Limes, PTA 05/30/2019, 12:27 PM  [place], [place], [address] Phone: [phone]   Fax:  [phone]  Name: Debbie Joseph MRN: [id number] Date of Birth: 12-21-1940

## 2019-05-31 ENCOUNTER — Ambulatory Visit
Admission: RE | Admit: 2019-05-31 | Discharge: 2019-05-31 | Disposition: A | Discharge status: Home / Self Care | Payer: Medicare Other | Source: Ambulatory Visit | Attending: Oncology | Admitting: Oncology

## 2019-05-31 DIAGNOSIS — R590 Localized enlarged lymph nodes: Secondary | ICD-10-CM | POA: Diagnosis not present

## 2019-05-31 DIAGNOSIS — Z853 Personal history of malignant neoplasm of breast: Secondary | ICD-10-CM

## 2019-06-01 ENCOUNTER — Other Ambulatory Visit: Payer: Self-pay

## 2019-06-01 ENCOUNTER — Ambulatory Visit: Payer: Medicare Other

## 2019-06-01 DIAGNOSIS — M25611 Stiffness of right shoulder, not elsewhere classified: Secondary | ICD-10-CM | POA: Diagnosis not present

## 2019-06-01 DIAGNOSIS — C50511 Malignant neoplasm of lower-outer quadrant of right female breast: Secondary | ICD-10-CM

## 2019-06-01 DIAGNOSIS — I890 Lymphedema, not elsewhere classified: Secondary | ICD-10-CM | POA: Diagnosis not present

## 2019-06-01 DIAGNOSIS — Z170 Estrogen receptor positive status [ER+]: Secondary | ICD-10-CM

## 2019-06-01 NOTE — Therapy (Signed)
[place], [place], [address] Phone: [phone]   Fax:  [phone]  Physical Therapy Treatment  Patient Details  Name: Debbie Joseph MRN: [id number] Date of Birth: [birth year] Referring Provider (PT): Stark Klein MD   Encounter Date: 06/01/2019  PT End of Session - 06/01/19 1006    Visit Number  8    Number of Visits  9    Date for PT Re-Evaluation  06/14/19    PT Start Time  1005    PT Stop Time  1100    PT Time Calculation (min)  55 min    Activity Tolerance  Patient tolerated treatment well    Behavior During Therapy  [facility] for tasks assessed/performed       Past Medical History:  Diagnosis Date  . Breast cancer ([place])    left breast  . Breast cancer, right breast ([place])   . Diabetes mellitus without complication ([place])   . Dyspnea on exertion   . Family history of adverse reaction to anesthesia    maternal aunt- slow to wake up   . Family history of breast cancer   . GERD (gastroesophageal reflux disease)   . Headache    hx of migraines greater than 20 years ago   . Hypertension   . Personal history of radiation therapy   . Radiation 07/04/14-08/22/14   right breast 50.4 Gy, lumpectomy cavity boosted to 10 Gy    Past Surgical History:  Procedure Laterality Date  . APPENDECTOMY  1974   with hernia  . BREAST BIOPSY    . BREAST LUMPECTOMY Bilateral    right 2016/ left 2002  . BREAST LUMPECTOMY WITH NEEDLE LOCALIZATION Right 05/08/2014   Procedure: BREAST LUMPECTOMY WITH NEEDLE LOCALIZATION;  Surgeon: Excell Seltzer, MD;  Location: [place];  Service: General;  Laterality: Right;  . BREAST SURGERY  2002   lt breast lump-cancer  . CATARACT EXTRACTION Bilateral 2009  . COLONOSCOPY    . dental inplant  2015  . PARTIAL HYSTERECTOMY  1983  . RE-EXCISION OF BREAST LUMPECTOMY Right 05/15/2014   Procedure: RE-EXCISION OF BREAST LUMPECTOMY SUPERIOR AND MEDIAL MARGINS;  Surgeon:  Excell Seltzer, MD;  Location: [facility];  Service: General;  Laterality: Right;  . UMBILICAL HERNIA REPAIR  1974    There were no vitals filed for this visit.  Subjective Assessment - 06/01/19 1007    Subjective  Pt reports that she had a biopsy of her lymph nodes on her L side yesterday afternoon. She staets that her neck is stiff and she is a little sore. She has received her compression sleeve and wore it at night stating that it made her arm feel better (se education). She reports that she didn't do her exercises last night due to the biopsy but has been performing her exercises regularly. Pt states that she is feeling better but continues with pain when she performs Abduction.    Pertinent History  L breast cancer in 2002 w/lumpectomy and radiation. R breast cancer in 2015 with lumpectomy and radiation    Patient Stated Goals  I want to see if I can get some of this swelling out of my breast and decrease my pain.    Currently in Pain?  Yes    Pain Score  3     Pain Location  Shoulder    Pain Orientation  Right    Pain Descriptors / Indicators  Aching    Pain Type  Chronic pain    Pain Onset  More than a month ago    Pain Frequency  Intermittent    Aggravating Factors   abduction    Pain Relieving Factors  rest                  Outpatient Rehab from [facility]/10/[facility]  Lymphedema Life Impact Scale Total Score  14.71 %           [facility] Adult PT Treatment/Exercise - 06/01/19 0001      Shoulder Exercises: Supine   Other Supine Exercises  Supine chest press and shoulder flexion w/VC to avoid pain where L axillary node biopsy was performed yesterday.      Shoulder Exercises: Standing   Other Standing Exercises  wall wash into flexoin and abduction 10x w/5 second hold for each arm. VC to avoid pain and for correct movement. Pt has significant improvement in ROM with Assistance at this time most likely related to weakness.        Manual Therapy   Manual Therapy  Soft tissue mobilization;Manual Lymphatic Drainage (MLD);Passive ROM;Myofascial release    Soft tissue mobilization  STM to the L upper trapezius, deltoid, triceps, lattismus dorsi wing at insertion on the humerus and supraspinatus; slight decrease following STM with 1x Tp release in the superspinatus.     Myofascial Release  Myofascial release longitudinally with flexion and horizontally with abduction    Manual Lymphatic Drainage (MLD)  In supine: short neck, swimming in the terminus, R axillary and inguinal nodes, R axillo-inguinal anastomosis, superior breast toward the R axilla and inferior breast towrard the R axillo-inguinal anastomosis, R shoulder; reworked all surfaces following STM    Passive ROM  In supine R shoulder fleixon, external rotation and abduction with slight increase in resistance into abduction with tightness noted at the R latissimus dorsi; decrease minimally following STM to this area.              PT Education - 06/01/19 1057    Education Details  Pt will continues with MLD and HEP at home. Pt was educated that she should not wear her sleeve at night. She needs to wear it only during the day in order to prevent lymphatic constriction at night.    Person(s) Educated  Patient    Methods  Explanation    Comprehension  Verbalized understanding       PT Short Term Goals - 05/10/19 0924      PT SHORT TERM GOAL #1   Title  Pt will be independent with initial HEP and MLD in order to demonstrate autonomy of care.    Baseline  pt does not have an HEP or know how to perform MLD    Time  2    Period  Weeks    Status  New    Target Date  05/31/19        PT Long Term Goals - 05/10/19 0924      PT LONG TERM GOAL #1   Title  Pt will demonstrate improvement in R shoulder ROM to 160 degrees flexion, 150 degrees abduction in order to demonstrate improve functional mobility.    Baseline  R shoulder flexion 153, R shoulder abduction 141     Time  4    Period  Weeks    Status  New    Target Date  06/14/19      PT LONG TERM GOAL #2   Title  Pt will  report 50% improvement in function and pain in the R breast/R shoulder from her initial evaluation in order to demonstrate an improve quality of life to decrease risk for immobility.    Baseline  10/10 pain at the worst, every day intermittently, 3/10 pain base line.    Time  4    Period  Weeks    Status  New    Target Date  06/14/19      PT LONG TERM GOAL #3   Title  Pt will have appropriate compression garments to wear on a dialy basis in order to decrease risk for increase edema.    Baseline  pt does not have compression garments.    Time  4    Period  Weeks    Status  New    Target Date  06/14/19      PT LONG TERM GOAL #4   Title  Pt will have 1 cm reduction in RUE circumferential measurements in the R distal brachium and R proximal antebrachium in order to demonstrate successful management of fluid at home.    Baseline  see measurements.    Time  4    Period  Weeks    Status  New    Target Date  06/14/19            Plan - 06/01/19 1006    Clinical Impression Statement  Palpable tightnes/tenderness noted at the R upper trapezius into the neck, deltoid, triceps, latissimus dorsi at insertion on the humerus and supraspinatus; decreased minimally following light STM and TpR. Pt P/ROM is improving following STM with myofascial release. She was able to perform AA/ROM into flexion w/o pain but reports slight increase in numbness/symptoms with abduction. MLD was performed following STM. Pt will benefit from continued POC at this time.    Rehab Potential  Excellent    PT Frequency  2x / week    PT Duration  4 weeks    PT Treatment/Interventions  Iontophoresis 4mg /ml Dexamethasone;Therapeutic activities;Therapeutic exercise;Neuromuscular re-education;Manual techniques    PT Next Visit Plan  Progress Note 1x/wk for 4 weeks, try isometric strenthening in neutral onthe R,  continue with MLD for the R breast, ask how biopsy site on the L is feeling.    PT Home Exercise Plan  Access Code: [email or web address]; supine scapular series    Consulted and Agree with Plan of Care  Patient       Patient will benefit from skilled therapeutic intervention in order to improve the following deficits and impairments:  Decreased range of motion, Pain, Increased edema  Visit Diagnosis: Malignant neoplasm of lower-outer quadrant of right breast of female, estrogen receptor positive ([place])  Lymphedema, not elsewhere classified  Stiffness of right shoulder, not elsewhere classified     Problem List Patient Active Problem List   Diagnosis Date Noted  . Family history of breast cancer   . Malignant neoplasm of lower-outer quadrant of right breast of female, estrogen receptor positive ([place]) 05/31/2014  . Essential hypertension 12/08/2013  . Dyspnea on exertion 12/08/2013    Ander Purpura, PT 06/01/2019, 11:04 AM  [place], [place], [address] Phone: [phone]   Fax:  [phone]  Name: CHELSAY SALCIDO MRN: [id number] Date of Birth: 07-May-1940

## 2019-06-07 ENCOUNTER — Other Ambulatory Visit: Payer: Self-pay

## 2019-06-07 ENCOUNTER — Ambulatory Visit: Payer: Medicare Other

## 2019-06-07 DIAGNOSIS — I10 Essential (primary) hypertension: Secondary | ICD-10-CM | POA: Diagnosis not present

## 2019-06-07 DIAGNOSIS — E1169 Type 2 diabetes mellitus with other specified complication: Secondary | ICD-10-CM | POA: Diagnosis not present

## 2019-06-07 DIAGNOSIS — M25611 Stiffness of right shoulder, not elsewhere classified: Secondary | ICD-10-CM | POA: Diagnosis not present

## 2019-06-07 DIAGNOSIS — Z170 Estrogen receptor positive status [ER+]: Secondary | ICD-10-CM | POA: Diagnosis not present

## 2019-06-07 DIAGNOSIS — I890 Lymphedema, not elsewhere classified: Secondary | ICD-10-CM | POA: Diagnosis not present

## 2019-06-07 DIAGNOSIS — C50911 Malignant neoplasm of unspecified site of right female breast: Secondary | ICD-10-CM | POA: Diagnosis not present

## 2019-06-07 DIAGNOSIS — E7800 Pure hypercholesterolemia, unspecified: Secondary | ICD-10-CM | POA: Diagnosis not present

## 2019-06-07 DIAGNOSIS — C50511 Malignant neoplasm of lower-outer quadrant of right female breast: Secondary | ICD-10-CM

## 2019-06-07 NOTE — Patient Instructions (Signed)
Access Code: [email or web address]  URL: [email or web address]  Date: 06/07/2019  Prepared by: Tomma Rakers   Exercises Seated Scapular Retraction - 20 reps - 1 sets - 1x daily - 7x weekly Isometric Shoulder Internal Rotation - 10 reps - 1 sets - 10 seconds hold - 1x daily - 7x weekly Isometric Shoulder External Rotation - 10 reps - 1 sets - 10 seconds hold - 1x daily - 7x weekly

## 2019-06-07 NOTE — Therapy (Signed)
[place], [place], [address] Phone: [phone]   Fax:  [phone]  Physical Therapy Progress Note  Progress Note Reporting Period 05/10/2019 to 06/07/2019  See note below for Objective Data and Assessment of Progress/Goals.      Patient Details  Name: Debbie Joseph MRN: [id number] Date of Birth: 1940/05/08 Referring Provider (PT): Stark Klein MD   Encounter Date: 06/07/2019  PT End of Session - 06/07/19 1002    Visit Number  9    Number of Visits  13    Date for PT Re-Evaluation  07/12/19    PT Start Time  [id number]    PT Stop Time  1056    PT Time Calculation (min)  54 min    Activity Tolerance  Patient tolerated treatment well    Behavior During Therapy  [facility] for tasks assessed/performed       Past Medical History:  Diagnosis Date  . Breast cancer ([place])    left breast  . Breast cancer, right breast ([place])   . Diabetes mellitus without complication ([place])   . Dyspnea on exertion   . Family history of adverse reaction to anesthesia    maternal aunt- slow to wake up   . Family history of breast cancer   . GERD (gastroesophageal reflux disease)   . Headache    hx of migraines greater than 20 years ago   . Hypertension   . Personal history of radiation therapy   . Radiation 07/04/14-08/22/14   right breast 50.4 Gy, lumpectomy cavity boosted to 10 Gy    Past Surgical History:  Procedure Laterality Date  . APPENDECTOMY  1974   with hernia  . BREAST BIOPSY    . BREAST LUMPECTOMY Bilateral    right 2016/ left 2002  . BREAST LUMPECTOMY WITH NEEDLE LOCALIZATION Right 05/08/2014   Procedure: BREAST LUMPECTOMY WITH NEEDLE LOCALIZATION;  Surgeon: Excell Seltzer, MD;  Location: [facility];  Service: General;  Laterality: Right;  . BREAST SURGERY  2002   lt breast lump-cancer  . CATARACT EXTRACTION Bilateral 2009  . COLONOSCOPY    . dental inplant  2015  . PARTIAL HYSTERECTOMY   1983  . RE-EXCISION OF BREAST LUMPECTOMY Right 05/15/2014   Procedure: RE-EXCISION OF BREAST LUMPECTOMY SUPERIOR AND MEDIAL MARGINS;  Surgeon: Excell Seltzer, MD;  Location: [facility];  Service: General;  Laterality: Right;  . UMBILICAL HERNIA REPAIR  1974    There were no vitals filed for this visit.  Subjective Assessment - 06/07/19 1002    Subjective  Pt reports that she had some increased pain on her R side last night that was real sharp. She states that she turned a certain way and reached then she started to experience her pain again. Pt reports that her biopsy was negative.    Pertinent History  L breast cancer in 2002 w/lumpectomy and radiation. R breast cancer in 2015 with lumpectomy and radiation    Patient Stated Goals  I want to see if I can get some of this swelling out of my breast and decrease my pain.    Currently in Pain?  Yes    Pain Score  5     Pain Location  Breast    Pain Orientation  Right    Pain Descriptors / Indicators  Aching    Pain Type  Chronic pain    Pain Radiating Towards  into the R breast    Pain Onset  More  than a month ago    Pain Frequency  Intermittent    Aggravating Factors   abduction/twisting    Pain Relieving Factors  rest    Multiple Pain Sites  No         [facility] PT Assessment - 06/07/19 0001      AROM   Right Shoulder Flexion  140 Degrees    Right Shoulder ABduction  143 Degrees        LYMPHEDEMA/ONCOLOGY QUESTIONNAIRE - 06/07/19 1016      Right Upper Extremity Lymphedema   15 cm Proximal to Olecranon Process  31.8 cm    Olecranon Process  26 cm    15 cm Proximal to Ulnar Styloid Process  24.8 cm    Just Proximal to Ulnar Styloid Process  16 cm    Across Hand at [employer]  17.7 cm    At [place] of 2nd Digit  5.9 cm           Outpatient [facility]10/[facility]  Lymphedema Life Impact Scale Total Score  14.71 %           [facility] Adult PT Treatment/Exercise - 06/07/19  0001      Shoulder Exercises: Seated   Retraction  Strengthening;Both;20 reps    Retraction Limitations  Demonstration for correct movement and VC to avoid shoulder extension compensation.     External Rotation  Strengthening;Right;10 reps    External Rotation Limitations  isometric 10 second hold w/demonstration and VC for correct muscle activation.     Internal Rotation  Strengthening;Right;10 reps    Internal Rotation Limitations  isometric 10 second hold, tactile cueing and VC with demonstration to address correct muscles      Manual Therapy   Manual Therapy  Manual Lymphatic Drainage (MLD);Edema management;Soft tissue mobilization    Edema Management  1/2 inch gray foam for the R breast/axillary area     Soft tissue mobilization  STM to the R upper trap, R thoracic paraspinals and supra/infrapsinatus; slight improvement following STM.     Manual Lymphatic Drainage (MLD)  In supine: short neck, swimming in the terminus, R axillary and inguinal nodes, R axillo-inguinal anastomosis, superior breast toward the R axilla and inferior breast towrard the R axillo-inguinal anastomosis, R shoulder, in L side-lying posterior anastomosis; reworked all surfaces following STM             PT Education - 06/07/19 1036    Education Details  Pt is going to wear 1/2 inch gray foam in compression bra every day, perform isometric shoulder exercises and will wear her compression sleeve with repetitive activities, hot weather or if she takes a plane for travel.    Person(s) Educated  Patient    Methods  Explanation;Demonstration;Verbal cues;Handout;Tactile cues    Comprehension  Verbalized understanding;Returned demonstration       PT Short Term Goals - 06/07/19 1007      PT SHORT TERM GOAL #1   Title  Pt will be independent with initial HEP and MLD in order to demonstrate autonomy of care.    Baseline  pt reports that she is performing HEP and MLD almost every evening. She has missed a few days  due to some family issues.    Status  Achieved        PT Long Term Goals - 06/07/19 1009      PT LONG TERM GOAL #1   Title  Pt will demonstrate improvement in R shoulder ROM  to 160 degrees flexion, 150 degrees abduction in order to demonstrate improve functional mobility.    Baseline  R shoulder flexion 140, R shoulder abduction 143    Time  4    Period  Weeks    Status  On-going    Target Date  07/12/19      PT LONG TERM GOAL #2   Title  Pt will report 75% improvement in function and pain in the R breast/R shoulder from her initial evaluation in order to demonstrate an improve quality of life to decrease risk for immobility.    Baseline  pt reports that she is 50% or better, she states pain is 5/10 at the worst and 0/10 at baseline.    Time  4    Period  Weeks    Status  Revised    Target Date  07/12/19      PT LONG TERM GOAL #3   Title  Pt will have appropriate compression garments to wear on a dialy basis in order to decrease risk for increase edema.    Baseline  Pt reports that she has received her compression garments but is not wearing them consistently at this time.    Time  4    Period  Weeks    Status  On-going    Target Date  07/12/19      PT LONG TERM GOAL #4   Title  Pt will have 1 cm reduction in RUE circumferential measurements in the R distal brachium and R proximal antebrachium in order to demonstrate successful management of fluid at home.    Baseline  progressing see measurements.    Time  4    Period  Weeks    Status  On-going    Target Date  07/12/19            Plan - 06/07/19 1002    Clinical Impression Statement  Pt is progressing with physical therapy towards her goals. Her circumferential measurements in her proximal antebrachium and distal brachium have improved. She continues with shoulder ROM deficits which improved but have now gotten a little worse most likely due to pt is helping a lot of family members with moving and other activities.  Discussed performing isometric activities to stabilize the rotator cuff. Pt was provided with isometric rotator cuff activities this session. Pt was provided with 1/2 inch gray foam for the R breast due to increased reports of aching/pain after sweeping her daughters house. STM was performed to the shoulder and posterior upper quadrant shoulder/neck musculature; slight improvement following moderate pressure STM. MLD was performed for the R breast across the posterior inter-axillary anastomosis this session. Pt will benefit from continued skilled physical therapy services 1x/week for 4 weeks to addres the above limitations.    PT Frequency  1x / week    PT Duration  4 weeks    PT Treatment/Interventions  Iontophoresis 4mg /ml Dexamethasone;Therapeutic activities;Therapeutic exercise;Neuromuscular re-education;Manual techniques    PT Next Visit Plan  assess foam and  isometric strenthening in neutral onthe R, continue with MLD for the R breast, ask how biopsy site on the L is feeling.    PT Home Exercise Plan  Access Code: [email or web address]; supine scapular series    Consulted and Agree with Plan of Care  Patient       Patient will benefit from skilled therapeutic intervention in order to improve the following deficits and impairments:  Decreased range of motion, Pain, Increased edema  Visit Diagnosis: Malignant neoplasm of  lower-outer quadrant of right breast of female, estrogen receptor positive ([place])  Lymphedema, not elsewhere classified  Stiffness of right shoulder, not elsewhere classified     Problem List Patient Active Problem List   Diagnosis Date Noted  . Family history of breast cancer   . Malignant neoplasm of lower-outer quadrant of right breast of female, estrogen receptor positive ([place]) 05/31/2014  . Essential hypertension 12/08/2013  . Dyspnea on exertion 12/08/2013    Ander Purpura, PT 06/07/2019, 11:25 AM  [place], [place], [address] Phone: [phone]   Fax:  [phone]  Name: TYRONZA KITTELL MRN: [id number] Date of Birth: [birth year]

## 2019-06-14 ENCOUNTER — Ambulatory Visit: Payer: Medicare Other

## 2019-06-14 ENCOUNTER — Encounter: Payer: Self-pay | Admitting: Oncology

## 2019-06-14 ENCOUNTER — Other Ambulatory Visit: Payer: Self-pay

## 2019-06-14 DIAGNOSIS — M25611 Stiffness of right shoulder, not elsewhere classified: Secondary | ICD-10-CM

## 2019-06-14 DIAGNOSIS — I890 Lymphedema, not elsewhere classified: Secondary | ICD-10-CM

## 2019-06-14 DIAGNOSIS — C50511 Malignant neoplasm of lower-outer quadrant of right female breast: Secondary | ICD-10-CM | POA: Diagnosis not present

## 2019-06-14 DIAGNOSIS — Z170 Estrogen receptor positive status [ER+]: Secondary | ICD-10-CM | POA: Diagnosis not present

## 2019-06-14 DIAGNOSIS — E1169 Type 2 diabetes mellitus with other specified complication: Secondary | ICD-10-CM | POA: Diagnosis not present

## 2019-06-14 NOTE — Therapy (Signed)
[place], [place], [address] Phone: [phone]   Fax:  [phone]  Physical Therapy Treatment  Patient Details  Name: Debbie Joseph MRN: [id number] Date of Birth: 1940-07-26 Referring Provider (PT): Stark Klein MD   Encounter Date: 06/14/2019  PT End of Session - 06/14/19 1034    Visit Number  10    Number of Visits  13    Date for PT Re-Evaluation  07/12/19    PT Start Time  1005    PT Stop Time  1104    PT Time Calculation (min)  59 min    Activity Tolerance  Patient tolerated treatment well    Behavior During Therapy  [facility] for tasks assessed/performed       Past Medical History:  Diagnosis Date  . Breast cancer ([place])    left breast  . Breast cancer, right breast ([place])   . Diabetes mellitus without complication ([place])   . Dyspnea on exertion   . Family history of adverse reaction to anesthesia    maternal aunt- slow to wake up   . Family history of breast cancer   . GERD (gastroesophageal reflux disease)   . Headache    hx of migraines greater than 20 years ago   . Hypertension   . Personal history of radiation therapy   . Radiation 07/04/14-08/22/14   right breast 50.4 Gy, lumpectomy cavity boosted to 10 Gy    Past Surgical History:  Procedure Laterality Date  . APPENDECTOMY  1974   with hernia  . BREAST BIOPSY    . BREAST LUMPECTOMY Bilateral    right 2016/ left 2002  . BREAST LUMPECTOMY WITH NEEDLE LOCALIZATION Right 05/08/2014   Procedure: BREAST LUMPECTOMY WITH NEEDLE LOCALIZATION;  Surgeon: Excell Seltzer, MD;  Location: [facility];  Service: General;  Laterality: Right;  . BREAST SURGERY  2002   lt breast lump-cancer  . CATARACT EXTRACTION Bilateral 2009  . COLONOSCOPY    . dental inplant  2015  . PARTIAL HYSTERECTOMY  1983  . RE-EXCISION OF BREAST LUMPECTOMY Right 05/15/2014   Procedure: RE-EXCISION OF BREAST LUMPECTOMY SUPERIOR AND MEDIAL MARGINS;   Surgeon: Excell Seltzer, MD;  [facility];  Service: General;  Laterality: Right;  . UMBILICAL HERNIA REPAIR  1974    There were no vitals filed for this visit.  Subjective Assessment - 06/14/19 1012    Subjective  My biopsy site is feeling much better, it doesn't even bother me anymore. And I was feeling off last week but this week my Rt upper quadrant is feeling great.    Pertinent History  L breast cancer in 2002 w/lumpectomy and radiation. R breast cancer in 2015 with lumpectomy and radiation    Patient Stated Goals  I want to see if I can get some of this swelling out of my breast and decrease my pain.    Currently in Pain?  No/denies         [facility] PT Assessment - 06/14/19 0001      AROM   Right Shoulder Flexion  155 Degrees    Right Shoulder ABduction  156 Degrees        LYMPHEDEMA/ONCOLOGY QUESTIONNAIRE - 06/14/19 1014      Right Upper Extremity Lymphedema   15 cm Proximal to Olecranon Process  32.5 cm    Olecranon Process  27.2 cm    15 cm Proximal to Ulnar Styloid Process  25.9 cm    Just  Proximal to Ulnar Styloid Process  16.6 cm    Across Hand at [employer]  18.9 cm    At [place] of 2nd Digit  6.3 cm           Outpatient Re[facility] from [facility]/10/[facility]  Lymphedema Life Impact Scale Total Score  14.71 %           [facility] Adult PT Treatment/Exercise - 06/14/19 0001      Shoulder Exercises: Supine   Horizontal ABduction  Strengthening;Both;5 reps;Theraband    Theraband Level (Shoulder Horizontal ABduction)  Level 1 (Yellow)    External Rotation  Strengthening;Both;5 reps;Theraband    Theraband Level (Shoulder External Rotation)  Level 1 (Yellow)    Flexion  Strengthening;Both;5 reps;Theraband   Narrow and Wide Grip, 5x each   Theraband Level (Shoulder Flexion)  Level 1 (Yellow)    Diagonals  Strengthening;Right;Left;5 reps;Theraband    Theraband Level (Shoulder Diagonals)  Level 1 (Yellow)      Manual  Therapy   Myofascial Release  To Rt axilla at end passive abduction    Manual Lymphatic Drainage (MLD)  In supine: short neck, swimming in the terminus, superficial abdominals and 5 diaphragmatic breaths, Rt axillary and inguinal nodes, Rt axillo-inguinal anastomosis, superior breast toward the R axilla and inferior breast towrard the R axillo-inguinal anastomosis, Rt UE working from lateral upper arm to dorsum of hand; reworked all surfaces     Passive ROM  In supine Rt shoulder fleixon and abduction               PT Short Term Goals - 06/07/19 1007      PT SHORT TERM GOAL #1   Title  Pt will be independent with initial HEP and MLD in order to demonstrate autonomy of care.    Baseline  pt reports that she is performing HEP and MLD almost every evening. She has missed a few days due to some family issues.    Status  Achieved        PT Long Term Goals - 06/07/19 1009      PT LONG TERM GOAL #1   Title  Pt will demonstrate improvement in R shoulder ROM to 160 degrees flexion, 150 degrees abduction in order to demonstrate improve functional mobility.    Baseline  R shoulder flexion 140, R shoulder abduction 143    Time  4    Period  Weeks    Status  On-going    Target Date  07/12/19      PT LONG TERM GOAL #2   Title  Pt will report 75% improvement in function and pain in the R breast/R shoulder from her initial evaluation in order to demonstrate an improve quality of life to decrease risk for immobility.    Baseline  pt reports that she is 50% or better, she states pain is 5/10 at the worst and 0/10 at baseline.    Time  4    Period  Weeks    Status  Revised    Target Date  07/12/19      PT LONG TERM GOAL #3   Title  Pt will have appropriate compression garments to wear on a dialy basis in order to decrease risk for increase edema.    Baseline  Pt reports that she has received her compression garments but is not wearing them consistently at this time.    Time  4    Period   Weeks  Status  On-going    Target Date  07/12/19      PT LONG TERM GOAL #4   Title  Pt will have 1 cm reduction in RUE circumferential measurements in the R distal brachium and R proximal antebrachium in order to demonstrate successful management of fluid at home.    Baseline  progressing see measurements.    Time  4    Period  Weeks    Status  On-going    Target Date  07/12/19            Plan - 06/14/19 1034    Clinical Impression Statement  Pt comes in today reporting feeling much improved from last session with increased A/ROM and decreased pain. Her A/ROM was improved partially meeting that goal. Did include Rt UE in MLD sequence today as her Rt UE circumference was increased today. Also instructed pt to wear her compression sleeve and gauntlet until next session and we will remeasure. She verbalized understanding.    Stability/Clinical Decision Making  Stable/Uncomplicated    Rehab Potential  Excellent    PT Frequency  1x / week    PT Duration  4 weeks    PT Treatment/Interventions  Iontophoresis 4mg /ml Dexamethasone;Therapeutic activities;Therapeutic exercise;Neuromuscular re-education;Manual techniques    PT Next Visit Plan  Reassess circumference of Rt UE, continue with MLD for the R breast. Probable D/C next session if still doing well.    PT Home Exercise Plan  Access Code: [email or web address]; supine scapular series    Consulted and Agree with Plan of Care  Patient       Patient will benefit from skilled therapeutic intervention in order to improve the following deficits and impairments:  Decreased range of motion, Pain, Increased edema  Visit Diagnosis: Malignant neoplasm of lower-outer quadrant of right breast of female, estrogen receptor positive ([place])  Lymphedema, not elsewhere classified  Stiffness of right shoulder, not elsewhere classified     Problem List Patient Active Problem List   Diagnosis Date Noted  . Family history of breast cancer   . Malignant  neoplasm of lower-outer quadrant of right breast of female, estrogen receptor positive ([place]) 05/31/2014  . Essential hypertension 12/08/2013  . Dyspnea on exertion 12/08/2013    Otelia Limes, PTA 06/14/2019, 11:05 AM  [place], [place], [address] Phone: [phone]   Fax:  [phone]  Name: SIBEL STACHOWICZ MRN: [id number] Date of Birth: 11/16/1940

## 2019-06-21 ENCOUNTER — Ambulatory Visit: Payer: Medicare Other | Attending: General Surgery

## 2019-06-21 ENCOUNTER — Other Ambulatory Visit: Payer: Self-pay

## 2019-06-21 DIAGNOSIS — I890 Lymphedema, not elsewhere classified: Secondary | ICD-10-CM | POA: Diagnosis not present

## 2019-06-21 DIAGNOSIS — Z170 Estrogen receptor positive status [ER+]: Secondary | ICD-10-CM | POA: Insufficient documentation

## 2019-06-21 DIAGNOSIS — M25611 Stiffness of right shoulder, not elsewhere classified: Secondary | ICD-10-CM | POA: Insufficient documentation

## 2019-06-21 DIAGNOSIS — C50511 Malignant neoplasm of lower-outer quadrant of right female breast: Secondary | ICD-10-CM | POA: Diagnosis not present

## 2019-06-21 NOTE — Therapy (Signed)
[place], [place], [address] Phone: [phone]   Fax:  [phone]  Physical Therapy Treatment  Patient Details  Name: Debbie Joseph MRN: [id number] Date of Birth: 01-Oct-1940 Referring Provider (PT): Stark Klein MD   Encounter Date: 06/21/2019  PT End of Session - 06/21/19 1104    Visit Number  11    Number of Visits  13    Date for PT Re-Evaluation  07/12/19    PT Start Time  1006    PT Stop Time  1103    PT Time Calculation (min)  57 min    Activity Tolerance  Patient tolerated treatment well    Behavior During Therapy  [facility] for tasks assessed/performed       Past Medical History:  Diagnosis Date  . Breast cancer ([place])    left breast  . Breast cancer, right breast ([place])   . Diabetes mellitus without complication ([place])   . Dyspnea on exertion   . Family history of adverse reaction to anesthesia    maternal aunt- slow to wake up   . Family history of breast cancer   . GERD (gastroesophageal reflux disease)   . Headache    hx of migraines greater than 20 years ago   . Hypertension   . Personal history of radiation therapy   . Radiation 07/04/14-08/22/14   right breast 50.4 Gy, lumpectomy cavity boosted to 10 Gy    Past Surgical History:  Procedure Laterality Date  . APPENDECTOMY  1974   with hernia  . BREAST BIOPSY    . BREAST LUMPECTOMY Bilateral    right 2016/ left 2002  . BREAST LUMPECTOMY WITH NEEDLE LOCALIZATION Right 05/08/2014   Procedure: BREAST LUMPECTOMY WITH NEEDLE LOCALIZATION;  Surgeon: Excell Seltzer, MD;  Location: [place];  Service: General;  Laterality: Right;  . BREAST SURGERY  2002   lt breast lump-cancer  . CATARACT EXTRACTION Bilateral 2009  . COLONOSCOPY    . dental inplant  2015  . PARTIAL HYSTERECTOMY  1983  . RE-EXCISION OF BREAST LUMPECTOMY Right 05/15/2014   Procedure: RE-EXCISION OF BREAST LUMPECTOMY SUPERIOR AND MEDIAL MARGINS;   Surgeon: Excell Seltzer, MD;  Location[facility];  Service: General;  Laterality: Right;  . UMBILICAL HERNIA REPAIR  1974    There were no vitals filed for this visit.  Subjective Assessment - 06/21/19 1009    Subjective  Today my Rt breast does feel a little fuller and been having some sharp pains with the increased swelling. I think though I haven't been drinking enough water over the past week though and I'm retaining.    Pertinent History  L breast cancer in 2002 w/lumpectomy and radiation. R breast cancer in 2015 with lumpectomy and radiation    Patient Stated Goals  I want to see if I can get some of this swelling out of my breast and decrease my pain.    Currently in Pain?  No/denies         [facility] PT Assessment - 06/21/19 0001      AROM   Right Shoulder Flexion  161 Degrees    Right Shoulder ABduction  165 Degrees        LYMPHEDEMA/ONCOLOGY QUESTIONNAIRE - 06/21/19 1014      Right Upper Extremity Lymphedema   15 cm Proximal to Olecranon Process  32.2 cm    10 cm Proximal to Olecranon Process  31 cm    Olecranon Process  26.6  cm    15 cm Proximal to Ulnar Styloid Process  25.6 cm    10 cm Proximal to Ulnar Styloid Process  22.1 cm    Just Proximal to Ulnar Styloid Process  16.3 cm    Across Hand at [employer]  18.8 cm    At [place] of 2nd Digit  6.2 cm           Outpatient [facility]  Lymphedema Life Impact Scale Total Score  14.71 %           [facility] Adult PT Treatment/Exercise - 06/21/19 0001      Manual Therapy   Myofascial Release  To Rt axilla at end passive abduction    Manual Lymphatic Drainage (MLD)  In supine: short neck, supraclavicular fossa, bil shoulder collectors, superficial abdominals and 5 diaphragmatic breaths, Rt inguinal nodes, Rt axillo-inguinal anastomosis, Rt breast toward the Rt axillo-inguinal anastomosis focusing more on lateral aspect where fullness palpable though was  softer by end of session, and Rt UE working from lateral upper arm to dorsum of hand; reworked all surfaces     Passive ROM  In supine Rt shoulder flexion and abduction               PT Short Term Goals - 06/07/19 1007      PT SHORT TERM GOAL #1   Title  Pt will be independent with initial HEP and MLD in order to demonstrate autonomy of care.    Baseline  pt reports that she is performing HEP and MLD almost every evening. She has missed a few days due to some family issues.    Status  Achieved        PT Long Term Goals - 06/21/19 1024      PT LONG TERM GOAL #1   Title  Pt will demonstrate improvement in R shoulder ROM to 160 degrees flexion, 150 degrees abduction in order to demonstrate improve functional mobility.      PT LONG TERM GOAL #2   Title  Pt will report 75% improvement in function and pain in the R breast/R shoulder from her initial evaluation in order to demonstrate an improve quality of life to decrease risk for immobility.    Baseline  pt reports that she is 50% or better, she states pain is 5/10 at the worst and 0/10 at baseline; 95% improvement with this - 06/21/19    Status  Achieved      PT LONG TERM GOAL #3   Title  Pt will have appropriate compression garments to wear on a dialy basis in order to decrease risk for increase edema.    Baseline  Pt reports that she has received her compression garments but is not wearing them consistently at this time; has compression sleeve that she wears prn and bra she wears daily - 06/21/19    Status  Achieved      PT LONG TERM GOAL #4   Title  Pt will have 1 cm reduction in RUE circumferential measurements in the R distal brachium and R proximal antebrachium in order to demonstrate successful management of fluid at home.            Plan - 06/21/19 1105    Clinical Impression Statement  Pt comes in today reporting she feels the fullness is back some in her lateral Rt breast today. Also reports that she knows her  fluid/water intake was much less than normal  over past week as the water dispenser from her fridge wasn't working last week, and this can be contributing factor to feeling increased fluid. Her circumference measurements had some reduced from last sessions flare up but not reduced as much as 2 weeks ago. Encouraged her to work on increasing water intake over next week to see if this helps decrease more recent (past few days) fullness she has been experiencing in her lateral breast. Her A/ROM has improved well meeting that goal, as well as her reports of decr pain and inc function of Rt UE with ADLs meeting that goal as well. Pt wil benefit from another session in a week to reassess latera lbreast fullness/lymphedema. Continued to avoid Lt axillary lymph nodes and anterior inter-axillary anastomosis as her lymph nodes are still palpably and visibly swollen since her 2nd COVID vaccine shot then biopsy.    Stability/Clinical Decision Making  Stable/Uncomplicated    Rehab Potential  Excellent    PT Frequency  1x / week    PT Duration  4 weeks    PT Treatment/Interventions  Iontophoresis 4mg /ml Dexamethasone;Therapeutic activities;Therapeutic exercise;Neuromuscular re-education;Manual techniques    PT Next Visit Plan  Reassess circumference of Rt UE, continue with MLD for the R breast. Probable D/C next session if doing better.    PT Home Exercise Plan  Access Code: [email or web address]; supine scapular series    Consulted and Agree with Plan of Care  Patient       Patient will benefit from skilled therapeutic intervention in order to improve the following deficits and impairments:  Decreased range of motion, Pain, Increased edema  Visit Diagnosis: Malignant neoplasm of lower-outer quadrant of right breast of female, estrogen receptor positive ([place])  Lymphedema, not elsewhere classified  Stiffness of right shoulder, not elsewhere classified     Problem List Patient Active Problem List   Diagnosis Date Noted   . Family history of breast cancer   . Malignant neoplasm of lower-outer quadrant of right breast of female, estrogen receptor positive ([place]) 05/31/2014  . Essential hypertension 12/08/2013  . Dyspnea on exertion 12/08/2013    Otelia Limes, PTA 06/21/2019, 12:28 PM  [place], [place], [address] Phone: [phone]   Fax:  [phone]  Name: RIFKY KASTOR MRN: [id number] Date of Birth: January 27, 1941

## 2019-06-28 ENCOUNTER — Other Ambulatory Visit: Payer: Self-pay | Admitting: Oncology

## 2019-06-28 ENCOUNTER — Ambulatory Visit: Payer: Medicare Other

## 2019-06-28 ENCOUNTER — Other Ambulatory Visit: Payer: Self-pay

## 2019-06-28 DIAGNOSIS — I890 Lymphedema, not elsewhere classified: Secondary | ICD-10-CM

## 2019-06-28 DIAGNOSIS — M25611 Stiffness of right shoulder, not elsewhere classified: Secondary | ICD-10-CM

## 2019-06-28 DIAGNOSIS — C50511 Malignant neoplasm of lower-outer quadrant of right female breast: Secondary | ICD-10-CM

## 2019-06-28 DIAGNOSIS — Z170 Estrogen receptor positive status [ER+]: Secondary | ICD-10-CM | POA: Diagnosis not present

## 2019-06-28 NOTE — Therapy (Signed)
[place], [place], [address] Phone: [phone]   Fax:  [phone]  Physical Therapy Treatment  Patient Details  Name: Debbie Joseph MRN: [id number] Date of Birth: 07-11-1940 Referring Provider (PT): Stark Klein MD   Encounter Date: 06/28/2019  PT End of Session - 06/28/19 1246    Visit Number  12    Number of Visits  13    Date for PT Re-Evaluation  07/12/19    PT Start Time  1003    PT Stop Time  1102    PT Time Calculation (min)  59 min    Activity Tolerance  Patient tolerated treatment well    Behavior During Therapy  [facility] for tasks assessed/performed       Past Medical History:  Diagnosis Date  . Breast cancer ([place])    left breast  . Breast cancer, right breast ([place])   . Diabetes mellitus without complication ([place])   . Dyspnea on exertion   . Family history of adverse reaction to anesthesia    maternal aunt- slow to wake up   . Family history of breast cancer   . GERD (gastroesophageal reflux disease)   . Headache    hx of migraines greater than 20 years ago   . Hypertension   . Personal history of radiation therapy   . Radiation 07/04/14-08/22/14   right breast 50.4 Gy, lumpectomy cavity boosted to 10 Gy    Past Surgical History:  Procedure Laterality Date  . APPENDECTOMY  1974   with hernia  . BREAST BIOPSY    . BREAST LUMPECTOMY Bilateral    right 2016/ left 2002  . BREAST LUMPECTOMY WITH NEEDLE LOCALIZATION Right 05/08/2014   Procedure: BREAST LUMPECTOMY WITH NEEDLE LOCALIZATION;  Surgeon: Excell Seltzer, MD;  Location: [place];  Service: General;  Laterality: Right;  . BREAST SURGERY  2002   lt breast lump-cancer  . CATARACT EXTRACTION Bilateral 2009  . COLONOSCOPY    . dental inplant  2015  . PARTIAL HYSTERECTOMY  1983  . RE-EXCISION OF BREAST LUMPECTOMY Right 05/15/2014   Procedure: RE-EXCISION OF BREAST LUMPECTOMY SUPERIOR AND MEDIAL MARGINS;   Surgeon: Excell Seltzer, MD;  [facility];  Service: General;  Laterality: Right;  . UMBILICAL HERNIA REPAIR  1974    There were no vitals filed for this visit.  Subjective Assessment - 06/28/19 1006    Subjective  I've been able to reach further with my Rt arm and use it more. I do notice my Rt shoulder still pops when I do the sword exercise with the band.    Pertinent History  L breast cancer in 2002 w/lumpectomy and radiation. R breast cancer in 2015 with lumpectomy and radiation    Patient Stated Goals  I want to see if I can get some of this swelling out of my breast and decrease my pain.    Currently in Pain?  No/denies         [facility] PT Assessment - 06/28/19 0001      AROM   Right Shoulder Flexion  160 Degrees    Right Shoulder ABduction  162 Degrees        LYMPHEDEMA/ONCOLOGY QUESTIONNAIRE - 06/28/19 1054      Right Upper Extremity Lymphedema   15 cm Proximal to Olecranon Process  32.5 cm    10 cm Proximal to Olecranon Process  30.5 cm    Olecranon Process  26.9 cm    15  cm Proximal to Ulnar Styloid Process  25.4 cm    10 cm Proximal to Ulnar Styloid Process  22.3 cm    Just Proximal to Ulnar Styloid Process  16.2 cm    Across Hand at [employer]  18.7 cm    At [place] of 2nd Digit  6.2 cm           Outpatient [facility] from [facility]10/[facility]  Lymphedema Life Impact Scale Total Score  14.71 %           [facility] Adult PT Treatment/Exercise - 06/28/19 0001      Shoulder Exercises: Standing   Other Standing Exercises  Bil UE 3 way raises returning therapist demo with back against wall/core engaged and head/shoulders against wall for flexion, scaption and abduction x5 each      Manual Therapy   Myofascial Release  To Rt axilla at end passive abduction    Manual Lymphatic Drainage (MLD)  In supine: short neck, supraclavicular fossa, bil shoulder collectors, superficial abdominals and 5 diaphragmatic breaths, Rt  inguinal nodes, Rt axillo-inguinal anastomosis, Rt breast toward the Rt axillo-inguinal anastomosis focusing more on lateral aspect and Rt UE working from lateral upper arm to dorsum of hand; reworked all surfaces; then into Lt S/L for further work to lateral aspect of breast working towards Rt axillo-inguinal anastomosis, then finished retracing steps in supine. Very minimal fullness palpable today, and only when pt was in S/L    Passive ROM  In supine Rt shoulder flexion, abduction, and D2             PT Education - 06/28/19 1105    Education Details  Standing bil UE 3 way raises    Person(s) Educated  Patient    Methods  Explanation;Demonstration;Handout    Comprehension  Verbalized understanding;Returned demonstration       PT Short Term Goals - 06/07/19 1007      PT SHORT TERM GOAL #1   Title  Pt will be independent with initial HEP and MLD in order to demonstrate autonomy of care.    Baseline  pt reports that she is performing HEP and MLD almost every evening. She has missed a few days due to some family issues.    Status  Achieved        PT Long Term Goals - 06/28/19 1240      PT LONG TERM GOAL #1   Title  Pt will demonstrate improvement in R shoulder ROM to 160 degrees flexion, 150 degrees abduction in order to demonstrate improve functional mobility.    Baseline  R shoulder flexion 140, R shoulder abduction 143; Rt shoulder flexion 160 and abduction 162 degrees A/ROM - 06/28/19    Status  Achieved      PT LONG TERM GOAL #2   Title  Pt will report 75% improvement in function and pain in the R breast/R shoulder from her initial evaluation in order to demonstrate an improve quality of life to decrease risk for immobility.    Baseline  pt reports that she is 50% or better, she states pain is 5/10 at the worst and 0/10 at baseline; 95% improvement with this - 06/21/19    Status  Achieved      PT LONG TERM GOAL #3   Title  Pt will have appropriate compression garments to wear  on a dialy basis in order to decrease risk for increase edema.    Baseline  Pt reports that she  has received her compression garments but is not wearing them consistently at this time; has compression sleeve that she wears prn and bra she wears daily - 06/21/19    Status  Achieved      PT LONG TERM GOAL #4   Title  Pt will have 1 cm reduction in RUE circumferential measurements in the R distal brachium and R proximal antebrachium in order to demonstrate successful management of fluid at home.    Baseline  progressing see measurements; 0.5 cm reduction was attained at 15 cm proximal to olecranon process and 0.3 cm reduction at 15 cm proximal to ulnar styloid process, however these measurements did maintain well over the weeks with intermittent use of her compression sleeve - 06/28/19    Status  Partially Met            Plan - 06/28/19 1243    Clinical Impression Statement  Pt has progressed well overall and since last session. She had very minimal palpable lymphedema in Rt breast today and she reports noticing this has been much better. Her A/ROM measurements have improved meeting that goal. Progressed HEP to include standing bil UE strength. Overall pt has done well wth this episode of care and is ready for D/C at this time.    Stability/Clinical Decision Making  Stable/Uncomplicated    Rehab Potential  Excellent    PT Frequency  1x / week    PT Duration  4 weeks    PT Treatment/Interventions  Iontophoresis '4mg'$ /ml Dexamethasone;Therapeutic activities;Therapeutic exercise;Neuromuscular re-education;Manual techniques    PT Next Visit Plan  D/C this visit.    PT Home Exercise Plan  Access Code: [email or web address]; supine scapular series; standing 3 way raises for bil UE's    Consulted and Agree with Plan of Care  Patient       Patient will benefit from skilled therapeutic intervention in order to improve the following deficits and impairments:  Decreased range of motion, Pain, Increased edema  Visit  Diagnosis: Malignant neoplasm of lower-outer quadrant of right breast of female, estrogen receptor positive ([place])  Lymphedema, not elsewhere classified  Stiffness of right shoulder, not elsewhere classified     Problem List Patient Active Problem List   Diagnosis Date Noted  . Family history of breast cancer   . Malignant neoplasm of lower-outer quadrant of right breast of female, estrogen receptor positive ([place]) 05/31/2014  . Essential hypertension 12/08/2013  . Dyspnea on exertion 12/08/2013    Otelia Limes, PTA 06/28/2019, 12:46 PM  [place], [place], [address] Phone: [phone]   Fax:  [phone]  Name: Debbie Joseph MRN: [id number] Date of Birth: 04/17/1941

## 2019-06-28 NOTE — Patient Instructions (Signed)

## 2019-08-01 NOTE — Progress Notes (Signed)
Debbie Joseph  [phone] [phone]   ID: Debbie Joseph DOB: [birth year]  MR#: [id number]  [id number]  Patient Care Team: Shirline Frees, MD as PCP - General (Family Medicine) Corra Kaine, Virgie Dad, MD as Consulting Physician (Oncology) Excell Seltzer, MD (Inactive) as Consulting Physician (General Surgery) Gery Pray, MD as Consulting Physician (Radiation Oncology) Lorretta Harp, MD as Consulting Physician (Cardiology) OTHERS:    CHIEF COMPLAINT: Noninvasive breast cancer  CURRENT TREATMENT: Completing 5 years of tamoxifen tamoxifen   INTERVAL HISTORY: Swara returns today for follow-up of her ductal carcinoma in situ.   She continues on tamoxifen.  She has tolerated this well, with no significant side effects.  Since her last visit, she underwent bilateral diagnostic mammography with tomography and left breast ultrasonography at [facility] on 05/30/2019 showing: breast density category C; interval enlargement of a left axillary lymph node.  She and the radiologist did not know at the time that the [place] vaccine she had received recently could cause the adenopathy.  She proceeded to biopsy of the suspicious lymph node the following day. Pathology from the procedure ([id number]) showed a benign reactive lymph node.  This was felt to be concordant by radiology.   REVIEW OF SYSTEMS: Debbie Joseph feels tired all the time.  She is not exercising regularly.  Her hemoglobin A1c has risen to 6.8 as a result.  She is also enjoying carbs more than she knows she should.  She had her daughter and her daughter's husband and the daughter's mother-in-law lived with her for a year until November 2020 but they have since moved out and the patient and her husband are back alone at home.  Aside from these changes a detailed review of systems today was noncontributory   BREAST CANCER HISTORY: From the original intake note:   Debbie Joseph was first diagnosed with  breast cancer in 11/19/2000, when she underwent biopsy of a left breast ductal carcinoma in situ under [facility]. She underwent a fuller excision 12/18/2000 and achieve clear margins, although the closest margin remained at 1 mm. She then received radiation therapy and took raloxifene/Evista for approximately 5 years. She discontinued the medication partly because of cost and partly because of concerns regarding arthralgias and myalgias, which however most likely were not related.  She then did fine until 04/17/2014 where bilateral screening mammography with tomography at the breast Center showed new calcifications in the right breast. On 05/01/2014 she underwent right diagnostic mammography which found the breast density to be category C. There were fine pleomorphic calcifications spanning approximately 5 cm in the lower outer quadrant of the right breast. There was no associated mass.  On the same day needle core biopsy was obtained and showed ([id number]) ductal carcinoma in situ, high-grade, estrogen receptor 88% positive, with strong staining intensity; progesterone receptor 60% positive, with moderate staining intensity.  On 05/04/2014 the patient underwent bilateral breast MRI, which showed no findings of interest in the left breast and no abnormal appearing lymph nodes. In the right breast lower outer quadrant there was a 4.57 m area of clumped nodular enhancement. This was felt to correspond closely to the extent of mammographic calcifications.  Accordingly on 05/08/2014 the patient underwent right lumpectomy with additional right margin resection. The pathology from this procedure ([id number]) showed ductal carcinoma in situ, high-grade, with no invasive component. It measured 3 cm in greatest dimension. There was an additional centimeter of disease associated with the right medial margin portion, but even so margins  remained extremely close at less than a millimeter.  Accordingly on 05/15/2014  the patient underwent further surgery for margin clearance. Excision of the right medial and right superior margins found no residual carcinoma.  The patient's subsequent history is as detailed below   PAST MEDICAL HISTORY: Past Medical History:  Diagnosis Date  . Breast cancer ([place])    left breast  . Breast cancer, right breast ([place])   . Diabetes mellitus without complication ([place])   . Dyspnea on exertion   . Family history of adverse reaction to anesthesia    maternal aunt- slow to wake up   . Family history of breast cancer   . GERD (gastroesophageal reflux disease)   . Headache    hx of migraines greater than 20 years ago   . Hypertension   . Personal history of radiation therapy   . Radiation 07/04/14-08/22/14   right breast 50.4 Gy, lumpectomy cavity boosted to 10 Gy    PAST SURGICAL HISTORY: Past Surgical History:  Procedure Laterality Date  . APPENDECTOMY  1974   with hernia  . BREAST BIOPSY    . BREAST LUMPECTOMY Bilateral    right 2016/ left 2002  . BREAST LUMPECTOMY WITH NEEDLE LOCALIZATION Right 05/08/2014   Procedure: BREAST LUMPECTOMY WITH NEEDLE LOCALIZATION;  Surgeon: Excell Seltzer, MD;  Location: [facility];  Service: General;  Laterality: Right;  . BREAST SURGERY  2002   lt breast lump-cancer  . CATARACT EXTRACTION Bilateral 2009  . COLONOSCOPY    . dental inplant  2015  . PARTIAL HYSTERECTOMY  1983  . RE-EXCISION OF BREAST LUMPECTOMY Right 05/15/2014   Procedure: RE-EXCISION OF BREAST LUMPECTOMY SUPERIOR AND MEDIAL MARGINS;  Surgeon: Excell Seltzer, MD;  Location: [facility];  Service: General;  Laterality: Right;  . UMBILICAL HERNIA REPAIR  1974    FAMILY HISTORY Family History  Problem Relation Age of Onset  . Stroke Mother 5  . Hypertension Mother   . Lung cancer Father 71  . Stroke Maternal Grandfather 60  . Hypertension Maternal Grandfather   . Tuberculosis Paternal Grandmother   . Hypertension Brother   . Diabetes Brother    . Urolithiasis Brother   . Leukemia Sister 26       AML  . Diabetes Sister   . Hypertension Maternal Aunt   . Breast cancer Maternal Aunt 58  . Hypertension Maternal Uncle   . Colon cancer Maternal Grandmother        dx in her 101s   The patient's father died from lung cancer at the age of 40, in the setting of tobacco abuse. The patient's mother died at the age of 61. The patient's mother only sister was diagnosed with breast cancer at the age of 101. Kree herself has 3 brothers and 2 sisters. One of her sisters was diagnosed with acute myeloid leukemia at the age of 80. She survives. A maternal grandmother may have had colon or other cancer, diagnosed in her 3s. There is no other history of cancer in the family to the patient's knowledge   GYNECOLOGIC HISTORY:  No LMP recorded. Patient has had a hysterectomy. Menarche age 31 or the patient is GX P0. She took hormone replacement for approximately 15 years, and fertility treatments again very briefly. 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 She and her husband  do quite a bit of [place] work. 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 Their 2 adopted children are [name] lives in [facility] [place] and is currently looking for a job (his will be teaching at [employer] there), and Montpelier lives in [place] and is a definite card specialist. The patient has no grandchildren.   ADVANCED DIRECTIVES: Not in place; at her 06/26/2014 visit the patient was given the appropriate documents 2 complete and not arise at her discretion.   HEALTH MAINTENANCE: Social History   Tobacco Use  . Smoking status: Never Smoker  . Smokeless tobacco: Never Used  Substance Use Topics  . Alcohol use: No  . Drug use: No     Colonoscopy: 2008?/[facility]  PAP: Status post  hysterectomy  Bone density: 2007/normal  Lipid panel:  Allergies  Allergen Reactions  . Evista [Raloxifene] Other (See Comments)    Made joints hurt "really bad"    Current Outpatient Medications  Medication Sig Dispense Refill  . Digestive Enzymes (MULTI-ENZYME PO) Take by mouth.    . hydroxypropyl methylcellulose / hypromellose (ISOPTO TEARS / GONIOVISC) 2.5 % ophthalmic solution Place 1 drop into both eyes 4 (four) times daily.     . irbesartan-hydrochlorothiazide (AVALIDE) 150-12.5 MG tablet Take 1 tablet by mouth daily.    . Multiple Vitamins-Minerals (WOMENS 50+ MULTI VITAMIN/MIN PO) Take 1 tablet by mouth daily.    . RED YEAST RICE EXTRACT PO Take 1 tablet by mouth daily.     . tamoxifen (NOLVADEX) 20 MG tablet Take 1 tablet by mouth once daily 90 tablet 0  . UNABLE TO FIND 1 tablet daily. Vit B12 B6     No current facility-administered medications for this visit.    OBJECTIVE:  white woman in no acute distress Vitals:   08/02/19 1120  BP: (!) 144/68  Pulse: 79  Resp: 18  Temp: 98.3 F (36.8 C)  SpO2: 97%     Body mass index is 29.5 kg/m.    ECOG FS:1 - Symptomatic but completely ambulatory  Sclerae unicteric, EOMs intact Wearing a mask No cervical or supraclavicular adenopathy Lungs no rales or rhonchi Heart regular rate and rhythm Abd soft, nontender, positive bowel sounds MSK no focal spinal tenderness, no upper extremity lymphedema Neuro: nonfocal, well oriented, appropriate affect Breasts: Status post bilateral lumpectomies and bilateral radiation.  The left breast laterally does have significant scar tissue which makes the physical exam a little bit more complex.  Both axillae are benign.   CMP     Component Value Date/Time   NA 142 08/02/2019 1100   K 4.0 08/02/2019 1100   CL 106 08/02/2019 1100   CO2 27 08/02/2019 1100   GLUCOSE 109 (H) 08/02/2019 1100   BUN 18 08/02/2019 1100   CREATININE 0.80 08/02/2019 1100   CREATININE 0.76 02/22/2014 0855    CALCIUM 9.0 08/02/2019 1100   PROT 6.8 08/02/2019 1100   ALBUMIN 4.0 08/02/2019 1100   AST 21 08/02/2019 1100   ALT 21 08/02/2019 1100   ALKPHOS 56 08/02/2019 1100   BILITOT 0.5 08/02/2019 1100   GFRNONAA >60 08/02/2019 1100   GFRAA >60 08/02/2019 1100    INo results found for: SPEP, UPEP  Lab Results  Component Value Date   WBC 5.2 08/02/2019   NEUTROABS 2.3 08/02/2019   HGB 13.6 08/02/2019   HCT 42.1 08/02/2019   MCV 91.1 08/02/2019   PLT 198 08/02/2019      Chemistry      Component  Value Date/Time   NA 142 08/02/2019 1100   K 4.0 08/02/2019 1100   CL 106 08/02/2019 1100   CO2 27 08/02/2019 1100   BUN 18 08/02/2019 1100   CREATININE 0.80 08/02/2019 1100   CREATININE 0.76 02/22/2014 0855      Component Value Date/Time   CALCIUM 9.0 08/02/2019 1100   ALKPHOS 56 08/02/2019 1100   AST 21 08/02/2019 1100   ALT 21 08/02/2019 1100   BILITOT 0.5 08/02/2019 1100      No results found for: LABCA2  No components found for: LABCA125  No results for input(s): INR in the last 168 hours.  Urinalysis No results found for: COLORURINE, APPEARANCEUR, LABSPEC, PHURINE, GLUCOSEU, HGBUR, BILIRUBINUR, KETONESUR, PROTEINUR, UROBILINOGEN, NITRITE, LEUKOCYTESUR   STUDIES: No results found.   ASSESSMENT: 79 y.o. [place] woman  (1) status post left breast biopsy 11/19/2000 for ductal carcinoma in situ, high-grade followed by reexcision for clear margins 12/18/2000  (a) status post adjuvant radiation to the left breast  (b) received raloxifene for approximately 5 years  (2) status post right breast lower outer quadrant core biopsy 05/01/2014 for ductal carcinoma in situ, high-grade, estrogen receptor 88% positive, progesterone receptor 60% positive  (3)  right breast lumpectomy with additional right medial margin surgery 05/08/2014 showed a 3 cm ductal carcinoma in situ, high-grade, less than 1 mm from the final medial margin  (4) right breast reexcision 05/15/2014  showed no residual ductal carcinoma in situ  (5) adjuvant radiation 07/05/2014-08/22/2014: Right breast 50.4 Gy, lumpectomy cavity boost 10 Gy  (6) tamoxifen started May 2016, completed 5 years April 2021.   PLAN: Rainell is a little over 5 years out from definitive surgery for her breast cancer with no evidence of disease recurrence.  This is very favorable.  She has completed 5 years of anastrozole and now may stop that medication with no tapering.  I reassured her that what she is feeling in the lateral aspect of her left breast is scar tissue as was recently shown on her mammogram.  At this point I feel comfortable releasing her to her primary care physician.  All she will need as far as breast cancer follow-up is concerned is a yearly mammography and a yearly physician breast exam  I will be glad to see Natayah again at any point in the future if and when the need arises but as of now are making no further routine appointments for her here.  Total encounter time 25 minutes.*   Latash Nouri, Virgie Dad, MD  08/02/19 11:44 AM Medical Oncology and Hematology [facility] [place], [place] [address] Tel. [phone]    Fax. [phone]   I, Wilburn Mylar, am acting as scribe for Dr. Virgie Dad. Joelyn Lover.  I, Lurline Del MD, have reviewed the above documentation for accuracy and completeness, and I agree with the above.    *Total Encounter Time as defined by the Centers for Medicare and Medicaid Services includes, in addition to the face-to-face time of a patient visit (documented in the note above) non-face-to-face time: obtaining and reviewing outside history, ordering and reviewing medications, tests or procedures, care coordination (communications with other health care professionals or caregivers) and documentation in the medical record.

## 2019-08-02 ENCOUNTER — Inpatient Hospital Stay: Payer: Medicare Other

## 2019-08-02 ENCOUNTER — Inpatient Hospital Stay: Payer: Medicare Other | Attending: Oncology | Admitting: Oncology

## 2019-08-02 ENCOUNTER — Other Ambulatory Visit: Payer: Self-pay

## 2019-08-02 VITALS — BP 144/68 | HR 79 | Temp 98.3°F | Resp 18 | Ht 65.0 in | Wt 177.3 lb

## 2019-08-02 DIAGNOSIS — D0511 Intraductal carcinoma in situ of right breast: Secondary | ICD-10-CM | POA: Insufficient documentation

## 2019-08-02 DIAGNOSIS — Z79810 Long term (current) use of selective estrogen receptor modulators (SERMs): Secondary | ICD-10-CM | POA: Insufficient documentation

## 2019-08-02 DIAGNOSIS — Z79899 Other long term (current) drug therapy: Secondary | ICD-10-CM | POA: Insufficient documentation

## 2019-08-02 DIAGNOSIS — Z170 Estrogen receptor positive status [ER+]: Secondary | ICD-10-CM

## 2019-08-02 DIAGNOSIS — I10 Essential (primary) hypertension: Secondary | ICD-10-CM

## 2019-08-02 DIAGNOSIS — C50511 Malignant neoplasm of lower-outer quadrant of right female breast: Secondary | ICD-10-CM | POA: Diagnosis not present

## 2019-08-02 DIAGNOSIS — E119 Type 2 diabetes mellitus without complications: Secondary | ICD-10-CM | POA: Diagnosis not present

## 2019-08-02 LAB — COMPREHENSIVE METABOLIC PANEL
ALT: 21 U/L (ref 0–44)
AST: 21 U/L (ref 15–41)
Albumin: 4 g/dL (ref 3.5–5.0)
Alkaline Phosphatase: 56 U/L (ref 38–126)
Anion gap: 9 (ref 5–15)
BUN: 18 mg/dL (ref 8–23)
CO2: 27 mmol/L (ref 22–32)
Calcium: 9 mg/dL (ref 8.9–10.3)
Chloride: 106 mmol/L (ref 98–111)
Creatinine, Ser: 0.8 mg/dL (ref 0.44–1.00)
GFR calc Af Amer: >60 mL/min (ref >60)
GFR calc non Af Amer: >60 mL/min (ref >60)
Glucose, Bld: 109 mg/dL — ABNORMAL HIGH (ref 70–99)
Potassium: 4 mmol/L (ref 3.5–5.1)
Sodium: 142 mmol/L (ref 135–145)
Total Bilirubin: 0.5 mg/dL (ref 0.3–1.2)
Total Protein: 6.8 g/dL (ref 6.5–8.1)

## 2019-08-02 LAB — CBC WITH DIFFERENTIAL/PLATELET
Abs Immature Granulocytes: 0.01 10*3/uL (ref 0.00–0.07)
Basophils Absolute: 0.1 10*3/uL (ref 0.0–0.1)
Basophils Relative: 1 %
Eosinophils Absolute: 0.1 10*3/uL (ref 0.0–0.5)
Eosinophils Relative: 2 %
HCT: 42.1 % (ref 36.0–46.0)
Hemoglobin: 13.6 g/dL (ref 12.0–15.0)
Immature Granulocytes: 0 %
Lymphocytes Relative: 45 %
Lymphs Abs: 2.3 10*3/uL (ref 0.7–4.0)
MCH: 29.4 pg (ref 26.0–34.0)
MCHC: 32.3 g/dL (ref 30.0–36.0)
MCV: 91.1 fL (ref 80.0–100.0)
Monocytes Absolute: 0.4 10*3/uL (ref 0.1–1.0)
Monocytes Relative: 8 %
Neutro Abs: 2.3 10*3/uL (ref 1.7–7.7)
Neutrophils Relative %: 44 %
Platelets: 198 10*3/uL (ref 150–400)
RBC: 4.62 MIL/uL (ref 3.87–5.11)
RDW: 12.9 % (ref 11.5–15.5)
WBC: 5.2 10*3/uL (ref 4.0–10.5)
nRBC: 0 % (ref 0.0–0.2)

## 2019-08-03 ENCOUNTER — Telehealth: Payer: Self-pay | Admitting: Oncology

## 2019-08-03 NOTE — Telephone Encounter (Signed)
No 4/13 los. No changes made to pt's schedule.

## 2019-08-08 DIAGNOSIS — H35033 Hypertensive retinopathy, bilateral: Secondary | ICD-10-CM | POA: Diagnosis not present

## 2019-08-08 DIAGNOSIS — E119 Type 2 diabetes mellitus without complications: Secondary | ICD-10-CM | POA: Diagnosis not present

## 2019-08-08 DIAGNOSIS — Z961 Presence of intraocular lens: Secondary | ICD-10-CM | POA: Diagnosis not present

## 2019-08-08 DIAGNOSIS — H04123 Dry eye syndrome of bilateral lacrimal glands: Secondary | ICD-10-CM | POA: Diagnosis not present

## 2019-08-08 DIAGNOSIS — H353131 Nonexudative age-related macular degeneration, bilateral, early dry stage: Secondary | ICD-10-CM | POA: Diagnosis not present

## 2019-11-10 DIAGNOSIS — Z20822 Contact with and (suspected) exposure to covid-19: Secondary | ICD-10-CM | POA: Diagnosis not present

## 2019-12-07 DIAGNOSIS — K219 Gastro-esophageal reflux disease without esophagitis: Secondary | ICD-10-CM | POA: Diagnosis not present

## 2019-12-07 DIAGNOSIS — R8281 Pyuria: Secondary | ICD-10-CM | POA: Diagnosis not present

## 2019-12-07 DIAGNOSIS — I10 Essential (primary) hypertension: Secondary | ICD-10-CM | POA: Diagnosis not present

## 2019-12-07 DIAGNOSIS — E7800 Pure hypercholesterolemia, unspecified: Secondary | ICD-10-CM | POA: Diagnosis not present

## 2019-12-07 DIAGNOSIS — Z0000 Encounter for general adult medical examination without abnormal findings: Secondary | ICD-10-CM | POA: Diagnosis not present

## 2019-12-07 DIAGNOSIS — I700 Atherosclerosis of aorta: Secondary | ICD-10-CM | POA: Diagnosis not present

## 2019-12-07 DIAGNOSIS — E1169 Type 2 diabetes mellitus with other specified complication: Secondary | ICD-10-CM | POA: Diagnosis not present

## 2020-01-09 DIAGNOSIS — Z23 Encounter for immunization: Secondary | ICD-10-CM | POA: Diagnosis not present

## 2020-01-31 DIAGNOSIS — Z23 Encounter for immunization: Secondary | ICD-10-CM | POA: Diagnosis not present

## 2020-04-18 DIAGNOSIS — L570 Actinic keratosis: Secondary | ICD-10-CM | POA: Diagnosis not present

## 2020-04-18 DIAGNOSIS — D040 Carcinoma in situ of skin of lip: Secondary | ICD-10-CM | POA: Diagnosis not present

## 2020-04-18 DIAGNOSIS — I872 Venous insufficiency (chronic) (peripheral): Secondary | ICD-10-CM | POA: Diagnosis not present

## 2020-04-18 DIAGNOSIS — L853 Xerosis cutis: Secondary | ICD-10-CM | POA: Diagnosis not present

## 2020-04-18 DIAGNOSIS — X32XXXD Exposure to sunlight, subsequent encounter: Secondary | ICD-10-CM | POA: Diagnosis not present

## 2020-04-19 ENCOUNTER — Other Ambulatory Visit: Payer: Self-pay | Admitting: Family Medicine

## 2020-04-19 DIAGNOSIS — Z1231 Encounter for screening mammogram for malignant neoplasm of breast: Secondary | ICD-10-CM

## 2020-05-29 DIAGNOSIS — L570 Actinic keratosis: Secondary | ICD-10-CM | POA: Diagnosis not present

## 2020-05-29 DIAGNOSIS — X32XXXD Exposure to sunlight, subsequent encounter: Secondary | ICD-10-CM | POA: Diagnosis not present

## 2020-05-29 DIAGNOSIS — S1081XS Abrasion of other specified part of neck, sequela: Secondary | ICD-10-CM | POA: Diagnosis not present

## 2020-06-04 ENCOUNTER — Ambulatory Visit
Admission: RE | Admit: 2020-06-04 | Discharge: 2020-06-04 | Disposition: A | Discharge status: Home / Self Care | Payer: Medicare Other | Source: Ambulatory Visit | Attending: Family Medicine | Admitting: Family Medicine

## 2020-06-04 ENCOUNTER — Other Ambulatory Visit: Payer: Self-pay

## 2020-06-04 DIAGNOSIS — Z1231 Encounter for screening mammogram for malignant neoplasm of breast: Secondary | ICD-10-CM

## 2020-06-08 DIAGNOSIS — E1169 Type 2 diabetes mellitus with other specified complication: Secondary | ICD-10-CM | POA: Diagnosis not present

## 2020-06-08 DIAGNOSIS — I10 Essential (primary) hypertension: Secondary | ICD-10-CM | POA: Diagnosis not present

## 2020-06-08 DIAGNOSIS — R8281 Pyuria: Secondary | ICD-10-CM | POA: Diagnosis not present

## 2020-06-08 DIAGNOSIS — E7800 Pure hypercholesterolemia, unspecified: Secondary | ICD-10-CM | POA: Diagnosis not present

## 2020-08-08 DIAGNOSIS — H35033 Hypertensive retinopathy, bilateral: Secondary | ICD-10-CM | POA: Diagnosis not present

## 2020-08-08 DIAGNOSIS — H353131 Nonexudative age-related macular degeneration, bilateral, early dry stage: Secondary | ICD-10-CM | POA: Diagnosis not present

## 2020-08-08 DIAGNOSIS — H04123 Dry eye syndrome of bilateral lacrimal glands: Secondary | ICD-10-CM | POA: Diagnosis not present

## 2020-08-08 DIAGNOSIS — H5213 Myopia, bilateral: Secondary | ICD-10-CM | POA: Diagnosis not present

## 2020-08-08 DIAGNOSIS — H52223 Regular astigmatism, bilateral: Secondary | ICD-10-CM | POA: Diagnosis not present

## 2020-08-08 DIAGNOSIS — Z961 Presence of intraocular lens: Secondary | ICD-10-CM | POA: Diagnosis not present

## 2020-08-13 DIAGNOSIS — Z20822 Contact with and (suspected) exposure to covid-19: Secondary | ICD-10-CM | POA: Diagnosis not present

## 2020-08-28 DIAGNOSIS — L570 Actinic keratosis: Secondary | ICD-10-CM | POA: Diagnosis not present

## 2020-08-28 DIAGNOSIS — X32XXXD Exposure to sunlight, subsequent encounter: Secondary | ICD-10-CM | POA: Diagnosis not present

## 2020-10-21 IMAGING — US US AXILLARY NODE CORE BIOPSY LEFT
1 series · 7 of 7 positions shown · non-contrast
Comparison: Previous exam(s).
COMPARISON: Previous exam(s).

Addendum:
CLINICAL DATA: 78-year-old female presenting for ultrasound-guided
biopsy of a left axillary lymph node.

EXAM:
US AXILLARY NODE CORE BIOPSY LEFT

[Series 1: us axillary node core biopsy left · 0.06mm/px · 7 of 7 slices shown]
[im 1/7]
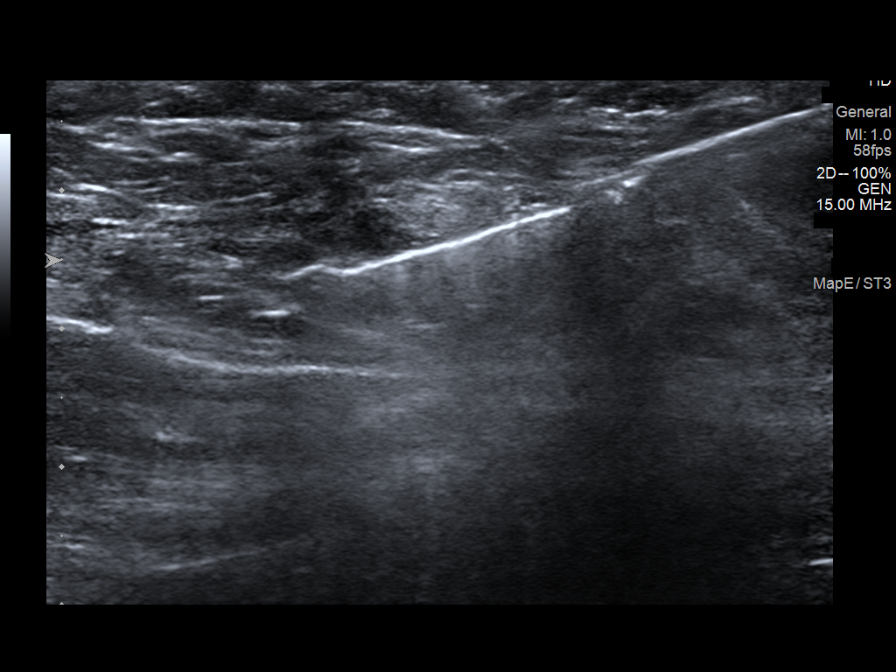
[im 2/7]
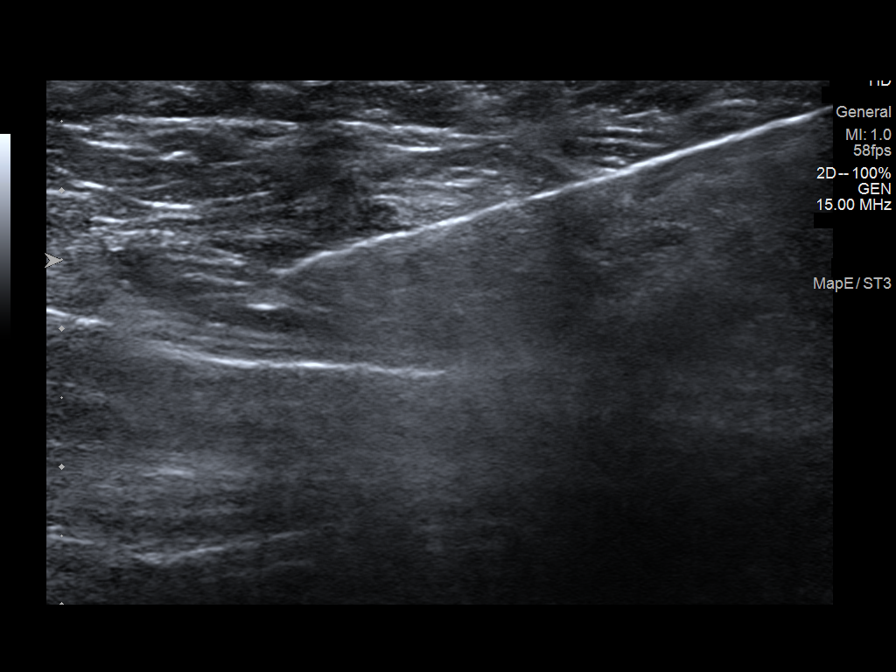
[im 3/7]
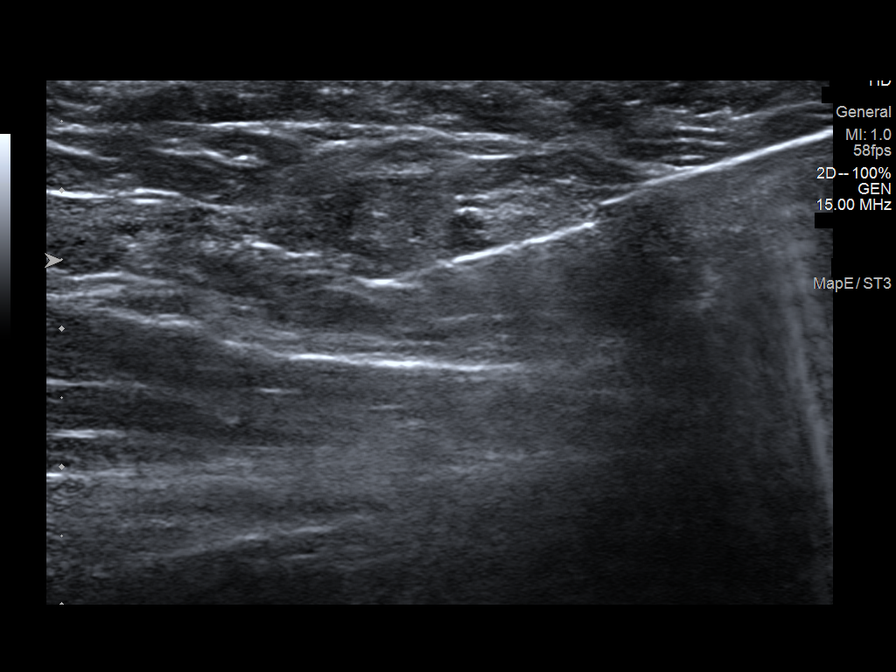
[im 4/7]
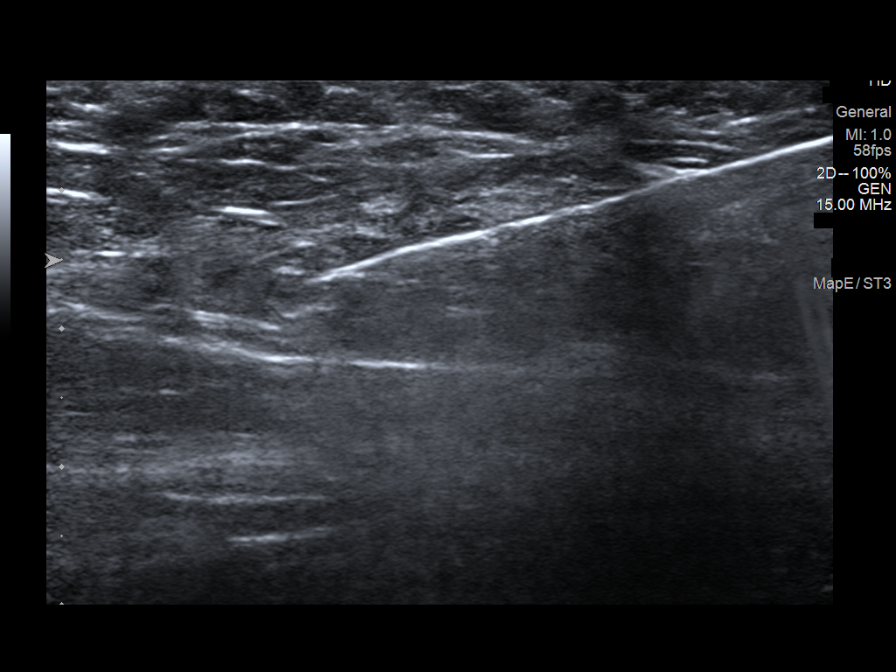
[im 5/7]
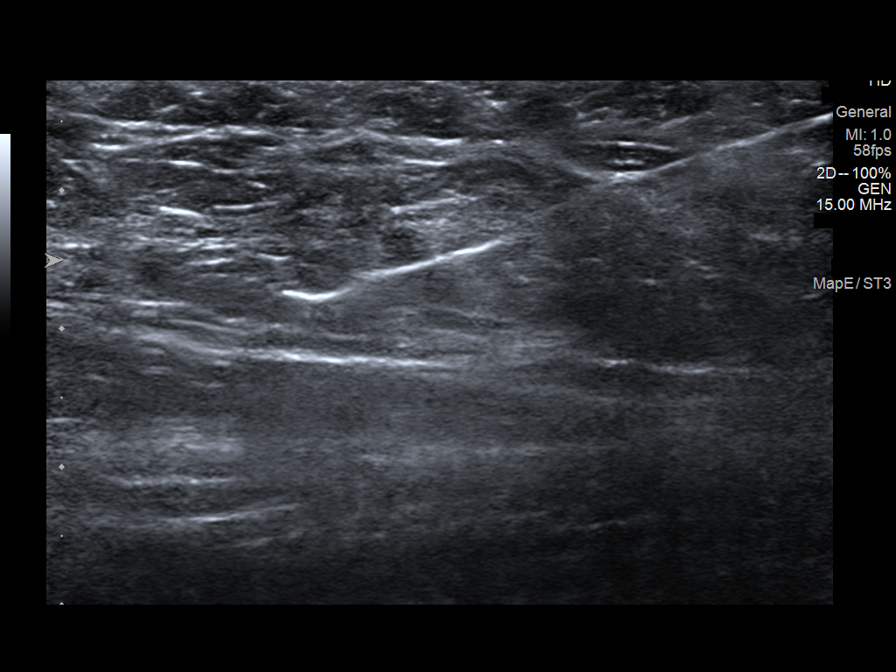
[im 6/7]
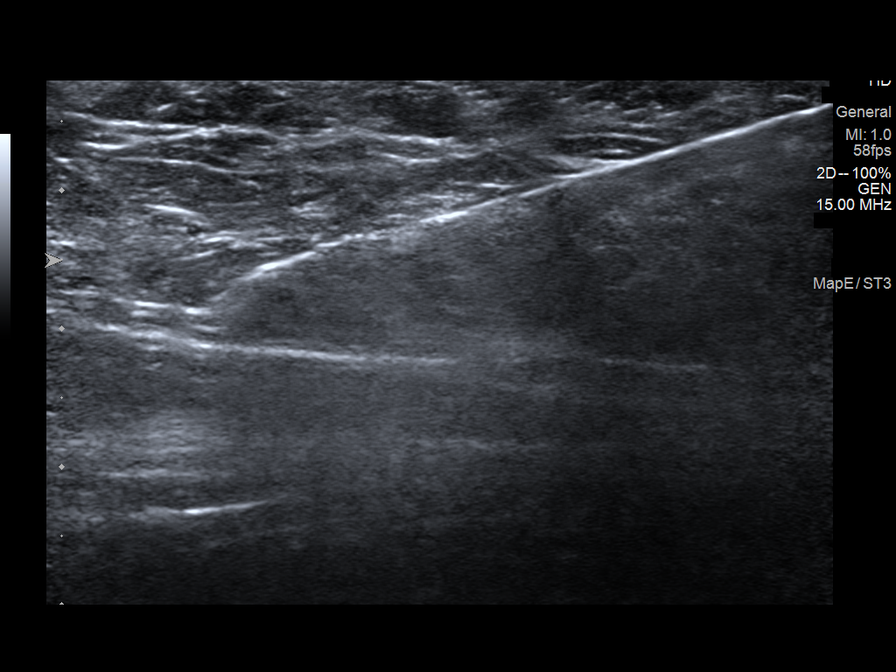
[im 7/7]
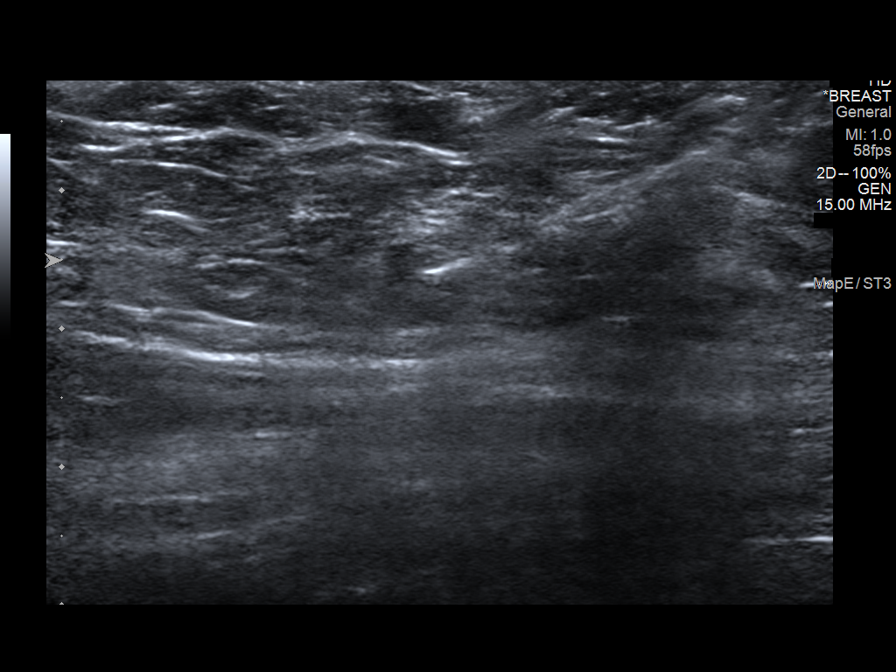

[7 of 7 positions shown; findings below may reference images not displayed]



Using sterile technique and 1% Lidocaine as local anesthetic, under
direct ultrasound visualization, a 14 gauge Tiger device was
used to perform biopsy of a left axillary lymph node using an
inferior approach. At the conclusion of the procedure HydroMARK
tissue marker clip was deployed into the biopsy cavity.
IMPRESSION: Ultrasound guided biopsy of a left axillary lymph node. No apparent
complications.

ADDENDUM:
Pathology revealed BENIGN REACTIVE LYMPH NODE of the LEFT axillary
lymph node. This was found to be concordant by Dr. Deeqa Rayaan Adlaho.

Pathology results were discussed with the patient by telephone. The
patient reported doing well after the biopsy with tenderness at the
site. Post biopsy instructions and care were reviewed and questions
were answered. The patient was encouraged to call The [REDACTED]

The patient was instructed to return for annual screening
mammography and informed a reminder notice would be sent regarding
this appointment.

Pathology results reported by Qli Rim Taleski RN on 06/01/2019.



Using sterile technique and 1% Lidocaine as local anesthetic, under
direct ultrasound visualization, a 14 gauge Tiger device was
used to perform biopsy of a left axillary lymph node using an
inferior approach. At the conclusion of the procedure HydroMARK
tissue marker clip was deployed into the biopsy cavity.
IMPRESSION: Ultrasound guided biopsy of a left axillary lymph node. No apparent
complications.

## 2020-12-14 DIAGNOSIS — I10 Essential (primary) hypertension: Secondary | ICD-10-CM | POA: Diagnosis not present

## 2020-12-14 DIAGNOSIS — E1169 Type 2 diabetes mellitus with other specified complication: Secondary | ICD-10-CM | POA: Diagnosis not present

## 2020-12-14 DIAGNOSIS — K219 Gastro-esophageal reflux disease without esophagitis: Secondary | ICD-10-CM | POA: Diagnosis not present

## 2020-12-14 DIAGNOSIS — Z853 Personal history of malignant neoplasm of breast: Secondary | ICD-10-CM | POA: Diagnosis not present

## 2020-12-14 DIAGNOSIS — E7800 Pure hypercholesterolemia, unspecified: Secondary | ICD-10-CM | POA: Diagnosis not present

## 2020-12-14 DIAGNOSIS — Z0000 Encounter for general adult medical examination without abnormal findings: Secondary | ICD-10-CM | POA: Diagnosis not present

## 2020-12-27 DIAGNOSIS — Z23 Encounter for immunization: Secondary | ICD-10-CM | POA: Diagnosis not present

## 2021-01-02 DIAGNOSIS — Z23 Encounter for immunization: Secondary | ICD-10-CM | POA: Diagnosis not present

## 2021-02-08 ENCOUNTER — Ambulatory Visit
Admission: RE | Admit: 2021-02-08 | Discharge: 2021-02-08 | Disposition: A | Discharge status: Home / Self Care | Payer: Medicare Other | Source: Ambulatory Visit | Attending: Family Medicine | Admitting: Family Medicine

## 2021-02-08 ENCOUNTER — Other Ambulatory Visit: Payer: Self-pay | Admitting: Family Medicine

## 2021-02-08 DIAGNOSIS — J029 Acute pharyngitis, unspecified: Secondary | ICD-10-CM | POA: Diagnosis not present

## 2021-02-08 DIAGNOSIS — Z03818 Encounter for observation for suspected exposure to other biological agents ruled out: Secondary | ICD-10-CM | POA: Diagnosis not present

## 2021-02-08 DIAGNOSIS — R059 Cough, unspecified: Secondary | ICD-10-CM

## 2021-02-08 DIAGNOSIS — B349 Viral infection, unspecified: Secondary | ICD-10-CM | POA: Diagnosis not present

## 2021-02-26 DIAGNOSIS — L570 Actinic keratosis: Secondary | ICD-10-CM | POA: Diagnosis not present

## 2021-02-26 DIAGNOSIS — X32XXXD Exposure to sunlight, subsequent encounter: Secondary | ICD-10-CM | POA: Diagnosis not present

## 2021-02-26 DIAGNOSIS — D040 Carcinoma in situ of skin of lip: Secondary | ICD-10-CM | POA: Diagnosis not present

## 2021-03-11 ENCOUNTER — Ambulatory Visit
Admission: RE | Admit: 2021-03-11 | Discharge: 2021-03-11 | Disposition: A | Discharge status: Home / Self Care | Payer: Medicare Other | Source: Ambulatory Visit | Attending: Family Medicine | Admitting: Family Medicine

## 2021-03-11 ENCOUNTER — Other Ambulatory Visit: Payer: Self-pay | Admitting: Family Medicine

## 2021-03-11 DIAGNOSIS — Z8701 Personal history of pneumonia (recurrent): Secondary | ICD-10-CM | POA: Diagnosis not present

## 2021-04-09 DIAGNOSIS — D040 Carcinoma in situ of skin of lip: Secondary | ICD-10-CM | POA: Diagnosis not present

## 2021-04-23 DIAGNOSIS — X32XXXD Exposure to sunlight, subsequent encounter: Secondary | ICD-10-CM | POA: Diagnosis not present

## 2021-04-23 DIAGNOSIS — D040 Carcinoma in situ of skin of lip: Secondary | ICD-10-CM | POA: Diagnosis not present

## 2021-04-23 DIAGNOSIS — L570 Actinic keratosis: Secondary | ICD-10-CM | POA: Diagnosis not present

## 2021-05-03 ENCOUNTER — Other Ambulatory Visit: Payer: Self-pay | Admitting: Family Medicine

## 2021-05-03 DIAGNOSIS — Z1231 Encounter for screening mammogram for malignant neoplasm of breast: Secondary | ICD-10-CM

## 2021-06-05 ENCOUNTER — Ambulatory Visit
Admission: RE | Admit: 2021-06-05 | Discharge: 2021-06-05 | Disposition: A | Discharge status: Home / Self Care | Payer: Medicare Other | Source: Ambulatory Visit | Attending: Family Medicine | Admitting: Family Medicine

## 2021-06-05 DIAGNOSIS — Z1231 Encounter for screening mammogram for malignant neoplasm of breast: Secondary | ICD-10-CM | POA: Diagnosis not present

## 2021-06-14 DIAGNOSIS — I10 Essential (primary) hypertension: Secondary | ICD-10-CM | POA: Diagnosis not present

## 2021-06-14 DIAGNOSIS — E7800 Pure hypercholesterolemia, unspecified: Secondary | ICD-10-CM | POA: Diagnosis not present

## 2021-06-14 DIAGNOSIS — R350 Frequency of micturition: Secondary | ICD-10-CM | POA: Diagnosis not present

## 2021-06-14 DIAGNOSIS — E1165 Type 2 diabetes mellitus with hyperglycemia: Secondary | ICD-10-CM | POA: Diagnosis not present

## 2021-06-14 DIAGNOSIS — Z853 Personal history of malignant neoplasm of breast: Secondary | ICD-10-CM | POA: Diagnosis not present

## 2021-06-14 DIAGNOSIS — E1169 Type 2 diabetes mellitus with other specified complication: Secondary | ICD-10-CM | POA: Diagnosis not present

## 2021-06-18 DIAGNOSIS — Z85828 Personal history of other malignant neoplasm of skin: Secondary | ICD-10-CM | POA: Diagnosis not present

## 2021-06-18 DIAGNOSIS — Z08 Encounter for follow-up examination after completed treatment for malignant neoplasm: Secondary | ICD-10-CM | POA: Diagnosis not present

## 2021-06-27 ENCOUNTER — Other Ambulatory Visit: Payer: Self-pay | Admitting: Family Medicine

## 2021-06-27 DIAGNOSIS — E2839 Other primary ovarian failure: Secondary | ICD-10-CM

## 2021-07-25 DIAGNOSIS — R053 Chronic cough: Secondary | ICD-10-CM | POA: Diagnosis not present

## 2021-07-25 DIAGNOSIS — J40 Bronchitis, not specified as acute or chronic: Secondary | ICD-10-CM | POA: Diagnosis not present

## 2021-07-30 DIAGNOSIS — J189 Pneumonia, unspecified organism: Secondary | ICD-10-CM | POA: Diagnosis not present

## 2021-07-30 DIAGNOSIS — J4520 Mild intermittent asthma, uncomplicated: Secondary | ICD-10-CM | POA: Diagnosis not present

## 2021-08-02 DIAGNOSIS — J189 Pneumonia, unspecified organism: Secondary | ICD-10-CM | POA: Diagnosis not present

## 2021-08-02 DIAGNOSIS — J45909 Unspecified asthma, uncomplicated: Secondary | ICD-10-CM | POA: Diagnosis not present

## 2021-08-06 DIAGNOSIS — R062 Wheezing: Secondary | ICD-10-CM | POA: Diagnosis not present

## 2021-08-06 DIAGNOSIS — J209 Acute bronchitis, unspecified: Secondary | ICD-10-CM | POA: Diagnosis not present

## 2021-08-06 DIAGNOSIS — R059 Cough, unspecified: Secondary | ICD-10-CM | POA: Diagnosis not present

## 2021-08-06 DIAGNOSIS — J019 Acute sinusitis, unspecified: Secondary | ICD-10-CM | POA: Diagnosis not present

## 2021-08-14 DIAGNOSIS — H353131 Nonexudative age-related macular degeneration, bilateral, early dry stage: Secondary | ICD-10-CM | POA: Diagnosis not present

## 2021-08-14 DIAGNOSIS — E119 Type 2 diabetes mellitus without complications: Secondary | ICD-10-CM | POA: Diagnosis not present

## 2021-08-14 DIAGNOSIS — Z961 Presence of intraocular lens: Secondary | ICD-10-CM | POA: Diagnosis not present

## 2021-08-14 DIAGNOSIS — H35033 Hypertensive retinopathy, bilateral: Secondary | ICD-10-CM | POA: Diagnosis not present

## 2021-08-14 DIAGNOSIS — H18513 Endothelial corneal dystrophy, bilateral: Secondary | ICD-10-CM | POA: Diagnosis not present

## 2021-08-21 ENCOUNTER — Encounter: Payer: Self-pay | Admitting: Pulmonary Disease

## 2021-08-21 ENCOUNTER — Ambulatory Visit (INDEPENDENT_AMBULATORY_CARE_PROVIDER_SITE_OTHER): Payer: Medicare Other | Admitting: Pulmonary Disease

## 2021-08-21 ENCOUNTER — Ambulatory Visit (INDEPENDENT_AMBULATORY_CARE_PROVIDER_SITE_OTHER): Payer: Medicare Other

## 2021-08-21 VITALS — BP 130/72 | HR 95 | Temp 97.9°F | Ht 65.0 in | Wt 176.4 lb

## 2021-08-21 DIAGNOSIS — R053 Chronic cough: Secondary | ICD-10-CM

## 2021-08-21 LAB — CBC WITH DIFFERENTIAL/PLATELET
Basophils Absolute: 0 10*3/uL (ref 0.0–0.1)
Basophils Relative: 0.7 % (ref 0.0–3.0)
Eosinophils Absolute: 0.2 10*3/uL (ref 0.0–0.7)
Eosinophils Relative: 2.8 % (ref 0.0–5.0)
HCT: 41.4 % (ref 36.0–46.0)
Hemoglobin: 13.6 g/dL (ref 12.0–15.0)
Lymphocytes Relative: 40 % (ref 12.0–46.0)
Lymphs Abs: 2.2 10*3/uL (ref 0.7–4.0)
MCHC: 32.8 g/dL (ref 30.0–36.0)
MCV: 88.8 fl (ref 78.0–100.0)
Monocytes Absolute: 0.5 10*3/uL (ref 0.1–1.0)
Monocytes Relative: 9.5 % (ref 3.0–12.0)
Neutro Abs: 2.6 10*3/uL (ref 1.4–7.7)
Neutrophils Relative %: 47 % (ref 43.0–77.0)
Platelets: 211 10*3/uL (ref 150.0–400.0)
RBC: 4.66 Mil/uL (ref 3.87–5.11)
RDW: 13.9 % (ref 11.5–15.5)
WBC: 5.6 10*3/uL (ref 4.0–10.5)

## 2021-08-21 LAB — POCT EXHALED NITRIC OXIDE: FeNO level (ppb): 8

## 2021-08-21 NOTE — Patient Instructions (Signed)
Use saline nasal rinse (nasal irrigation) daily ?Use flonase or nasacort 1 spray in each nostril daily ?Budesonide 1 puff twice per day, and rinse your mouth after each use ?Albuterol two puffs every 6 hours as needed for cough, wheeze, chest congestion, or shortness of breath ?Chest xray and lab tests today ?Will arrange for follow up after you do a pulmonary function test in 6 weeks ?

## 2021-08-21 NOTE — Addendum Note (Signed)
Addended by: Fran Lowes on: 08/21/2021 01:45 PM ? ? Modules accepted: Orders ? ?

## 2021-08-21 NOTE — Progress Notes (Signed)
? ?[facility] ? ?Chief Complaint  ?Patient presents with  ? Consult  ?  Patient is here to talk about cough and congestion. Patient has had bronchitis and pneumonia for a month.   ? ? ?Past Surgical History:  ?She  has a past surgical history that includes Partial hysterectomy (1983); Umbilical hernia repair (1974); Breast surgery (2002); Appendectomy (1974); Colonoscopy; Breast lumpectomy with needle localization (Right, 05/08/2014); Re-excision of breast lumpectomy (Right, 05/15/2014); Cataract extraction (Bilateral, 2009); dental inplant (2015); Breast biopsy; and Breast lumpectomy (Bilateral). ? ?Past Medical History:  ?Lt Breast cancer, DM type 2, GERD, Migraine headaches, HTN, Pneumonia October 2022 ? ?Constitutional:  ?BP 130/72 (BP Location: Right Arm, Patient Position: Sitting, Cuff Size: Normal)   Pulse 95   Temp 97.9 ?F (36.6 ?C) (Oral)   Ht '5\' 5"'$  (1.651 m)   Wt 176 lb 6.4 oz (80 kg)   SpO2 92%   BMI 29.35 kg/m?  ? ?Brief Summary:  ?Debbie Joseph is a 81 y.o. female with chronic cough. ?  ? ? ? ?Subjective:  ? ?She is here with her husband. ? ?She travels frequently for work.  She has lived in [place], [place], [place], and [place]. ? ?She had bronchitis and pneumonia several times before.  She was exposed to tuberculosis while living in [place] a few decades ago.  She never smoked cigarettes, but her parents did.  She was never told she had allergies or asthma, but several relatives have these.  She gets sneezing episodes during month of May. ? ?She started getting a cough again at the end of March 2023.  She had a fever when this started.  She was cough up phlegm.  She has sinus congestion and post nasal drip.  Would hear herself wheeze at times.  She saw her PCP.  Treated with several course of antibiotics and prednisone/medrol.  She was given albuterol and budesonide inhalers.  She wasn't sure when to use these, but these seem to help some when she did use  them.  She was told she had pneumonia.  Chest xray from 07/30/21 showed bronchial wall thickening and bilateral reticulonodular opacities ? ?Physical Exam:  ? ?Appearance - well kempt  ? ?ENMT - no sinus tenderness, no oral exudate, no LAN, Mallampati 3 airway, no stridor ? ?Respiratory - equal breath sounds bilaterally, no wheezing or rales ? ?CV - s1s2 regular rate and rhythm, no murmurs ? ?Ext - no clubbing, no edema ? ?Skin - no rashes ? ?Psych - normal mood and affect ?  ?Pulmonary testing:  ?FeNO 08/21/21 >> 8 ? ?Chest Imaging:  ? ? ?Cardiac Tests:  ?Echo 12/15/13 >> EF 60 to 65%, grade 1 DD ? ?Social History:  ?She  reports that she has never smoked. She has never used smokeless tobacco. She reports that she does not drink alcohol and does not use drugs. ? ?Family History:  ?Her family history includes Breast cancer (age of onset: 17) in her maternal aunt; Colon cancer in her maternal grandmother; Diabetes in her brother and sister; Hypertension in her brother, maternal aunt, maternal grandfather, maternal uncle, and mother; Leukemia (age of onset: 57) in her sister; Lung cancer (age of onset: 59) in her father; Stroke (age of onset: 61) in her maternal grandfather; Stroke (age of onset: 33) in her mother; Tuberculosis in her paternal grandmother; Urolithiasis in her brother. ?  ? ?Discussion:  ?She has recurrent episodes of rhino-sinusitis, bronchitis and pneumonia.  These are triggered  by respiratory infections and environmental/allergen exposures.  She has family history of asthma.  I am concerned she also has asthma contributing to her symptoms. ? ?Assessment/Plan:  ? ?Chronic rhinitis. ?- nasal irrigation and flonase ? ?Chronic cough with probable asthma. ?- continue budesonide 1 puff bid ?- prn albuterol ?- will arrange for chest xray, PFT, CBC with diff and IgE ? ?Time Spent Involved in Patient Care on Day of Examination:  ?52 minutes ? ?Follow up:  ? ?Patient Instructions  ?Use saline nasal rinse (nasal  irrigation) daily ?Use flonase or nasacort 1 spray in each nostril daily ?Budesonide 1 puff twice per day, and rinse your mouth after each use ?Albuterol two puffs every 6 hours as needed for cough, wheeze, chest congestion, or shortness of breath ?Chest xray and lab tests today ?Will arrange for follow up after you do a pulmonary function test in 6 weeks ? ?Medication List:  ? ?Allergies as of 08/21/2021   ? ?   Reactions  ? Levofloxacin   ? Patient gets joint pain  ? Evista [raloxifene] Other (See Comments)  ? Made joints hurt "really bad"  ? ?  ? ?  ?Medication List  ?  ? ?  ? Accurate as of Aug 21, 2021 11:55 AM. If you have any questions, ask your nurse or doctor.  ?  ?  ? ?  ? ?STOP taking these medications   ? ?MULTI-ENZYME PO ?Stopped by: Chesley Mires, MD ?  ?tamoxifen 20 MG tablet ?Commonly known as: NOLVADEX ?Stopped by: Chesley Mires, MD ?  ? ?  ? ?TAKE these medications   ? ?albuterol 108 (90 Base) MCG/ACT inhaler ?Commonly known as: VENTOLIN HFA ?2 puff as needed ?  ?Budesonide 90 MCG/ACT inhaler ?Inhale 1 puff into the lungs 2 (two) times daily. ?  ?CALCIUM & MAGNESIUM CARBONATES PO ?500 mg ?  ?E[place]berry Zinc/Vit C/Immune Lozg ?See admin instructions. ?  ?famotidine 20 MG tablet ?Commonly known as: PEPCID ?1 tablet at bedtime as needed ?  ?fluconazole 150 MG tablet ?Commonly known as: DIFLUCAN ?1 tablet ?  ?hydroxypropyl methylcellulose / hypromellose 2.5 % ophthalmic solution ?Commonly known as: ISOPTO TEARS / GONIOVISC ?Place 1 drop into both eyes 4 (four) times daily. ?  ?irbesartan-hydrochlorothiazide 150-12.5 MG tablet ?Commonly known as: AVALIDE ?Take 1 tablet by mouth daily. ?  ?irbesartan-hydrochlorothiazide 300-12.5 MG tablet ?Commonly known as: AVALIDE ?1 tablet ?  ?RED YEAST RICE EXTRACT PO ?Take 1 tablet by mouth daily. ?  ?UNABLE TO FIND ?1 tablet daily. Vit B12 B6 ?  ?WOMENS 50+ MULTI VITAMIN/MIN PO ?Take 1 tablet by mouth daily. ?  ? ?  ? ? ?Signature:  ?Chesley Mires, MD ?[place] ?Pager - [phone] - [phone] ?08/21/2021, 11:55 AM ?  ? ? ? ? ? ? ? ? ?

## 2021-08-22 LAB — IGE: IgE (Immunoglobulin E), Serum: 27 kU/L (ref <=114)

## 2021-10-03 ENCOUNTER — Ambulatory Visit (INDEPENDENT_AMBULATORY_CARE_PROVIDER_SITE_OTHER): Payer: Medicare Other | Admitting: Pulmonary Disease

## 2021-10-03 DIAGNOSIS — R053 Chronic cough: Secondary | ICD-10-CM | POA: Diagnosis not present

## 2021-10-03 LAB — PULMONARY FUNCTION TEST
DL/VA % pred: 124 %
DL/VA: 5.06 ml/min/mmHg/L
DLCO cor % pred: 85 %
DLCO cor: 16.35 ml/min/mmHg
DLCO unc % pred: 85 %
DLCO unc: 16.45 ml/min/mmHg
FEF 25-75 Post: 2.62 L/sec
FEF 25-75 Pre: 1.8 L/sec
FEF2575-%Change-Post: 46 %
FEF2575-%Pred-Post: 176 %
FEF2575-%Pred-Pre: 120 %
FEV1-%Change-Post: 10 %
FEV1-%Pred-Post: 103 %
FEV1-%Pred-Pre: 94 %
FEV1-Post: 2.1 L
FEV1-Pre: 1.91 L
FEV1FVC-%Change-Post: 4 %
FEV1FVC-%Pred-Pre: 107 %
FEV6-%Change-Post: 4 %
FEV6-%Pred-Post: 97 %
FEV6-%Pred-Pre: 93 %
FEV6-Post: 2.51 L
FEV6-Pre: 2.39 L
FEV6FVC-%Change-Post: 0 %
FEV6FVC-%Pred-Post: 105 %
FEV6FVC-%Pred-Pre: 105 %
FVC-%Change-Post: 5 %
FVC-%Pred-Post: 92 %
FVC-%Pred-Pre: 88 %
FVC-Post: 2.51 L
FVC-Pre: 2.39 L
Post FEV1/FVC ratio: 83 %
Post FEV6/FVC ratio: 100 %
Pre FEV1/FVC ratio: 80 %
Pre FEV6/FVC Ratio: 100 %
RV % pred: 58 %
RV: 1.41 L
TLC % pred: 75 %
TLC: 3.87 L

## 2021-10-03 NOTE — Progress Notes (Signed)
Full PFT Performed Today  

## 2021-10-03 NOTE — Patient Instructions (Signed)
Full PFT Performed Today  

## 2021-10-23 ENCOUNTER — Ambulatory Visit (INDEPENDENT_AMBULATORY_CARE_PROVIDER_SITE_OTHER): Payer: Medicare Other | Admitting: Pulmonary Disease

## 2021-10-23 ENCOUNTER — Encounter: Payer: Self-pay | Admitting: Pulmonary Disease

## 2021-10-23 VITALS — BP 128/82 | HR 85 | Temp 98.3°F | Ht 65.0 in | Wt 179.0 lb

## 2021-10-23 DIAGNOSIS — R053 Chronic cough: Secondary | ICD-10-CM | POA: Diagnosis not present

## 2021-10-23 DIAGNOSIS — Z23 Encounter for immunization: Secondary | ICD-10-CM

## 2021-10-23 NOTE — Addendum Note (Signed)
Addended by: Dessie Coma on: 10/23/2021 03:06 PM   Modules accepted: Orders

## 2021-10-23 NOTE — Progress Notes (Signed)
[facility]  Chief Complaint  Patient presents with   Follow-up    She is here to go over PFT results.     Past Surgical History:  She  has a past surgical history that includes Partial hysterectomy (1983); Umbilical hernia repair (1974); Breast surgery (2002); Appendectomy (1974); Colonoscopy; Breast lumpectomy with needle localization (Right, 05/08/2014); Re-excision of breast lumpectomy (Right, 05/15/2014); Cataract extraction (Bilateral, 2009); dental inplant (2015); Breast biopsy; and Breast lumpectomy (Bilateral).  Past Medical History:  Lt Breast cancer, DM type 2, GERD, Migraine headaches, HTN, Pneumonia October 2022  Constitutional:  BP 128/82 (BP Location: Left Arm, Cuff Size: Normal)   Pulse 85   Temp 98.3 F (36.8 C) (Oral)   Ht '5\' 5"'$  (1.651 m)   Wt 179 lb (81.2 kg)   SpO2 98%   BMI 29.79 kg/m   Brief Summary:  Debbie Joseph is a 81 y.o. female with chronic cough.      Subjective:   Chest xray from 08/21/21 was clear.  PFT was essentially normal.  Doing better now.  Only has occasional runny nose, post nasal drip and cough.  Not having chest congestion or wheeze.  Gets winded going up steps, but does okay on level ground.  No issues with sleep.  Her daughter moved in after recent divorce.  She is trying to cut down on her international travels.  She received pneumonia vaccine last in 2015.  Physical Exam:   Appearance - well kempt   ENMT - no sinus tenderness, no oral exudate, no LAN, Mallampati 3 airway, no stridor  Respiratory - equal breath sounds bilaterally, no wheezing or rales  CV - s1s2 regular rate and rhythm, no murmurs  Ext - no clubbing, no edema  Skin - no rashes  Psych - normal mood and affect    Pulmonary testing:  FeNO 08/21/21 >> 8 IgE 08/21/21 >> 27 PFT 10/03/21 >> FEV1 2.10 (103%), FEV1% 83, TLC 3.87 (75%), DLCO 85%  Chest Imaging:    Cardiac Tests:  Echo 12/15/13 >> EF 60 to 65%,  grade 1 DD  Social History:  She  reports that she has never smoked. She has never used smokeless tobacco. She reports that she does not drink alcohol and does not use drugs.  Family History:  Her family history includes Breast cancer (age of onset: 2) in her maternal aunt; Colon cancer in her maternal grandmother; Diabetes in her brother and sister; Hypertension in her brother, maternal aunt, maternal grandfather, maternal uncle, and mother; Leukemia (age of onset: 76) in her sister; Lung cancer (age of onset: 14) in her father; Stroke (age of onset: 28) in her maternal grandfather; Stroke (age of onset: 23) in her mother; Tuberculosis in her paternal grandmother; Urolithiasis in her brother.     Assessment/Plan:   Mild, intermittent asthma. - prn budesonide and albuterol  Chronic rhinitis. - nasal irrigation and flonase  Vaccinations. - she will get pneumonia 20 vaccine today - this should complete her pneumonia vaccination schedule  Time Spent Involved in Patient Care on Day of Examination:  36 minutes  Follow up:   Patient Instructions  Pneumonia 20 vaccine today  Follow up in 1 year  Medication List:   Allergies as of 10/23/2021       Reactions   Levofloxacin Other (See Comments)   Patient gets joint pain   Evista [raloxifene] Other (See Comments)   Made joints hurt "really bad"  Medication List        Accurate as of October 23, 2021  2:56 PM. If you have any questions, ask your nurse or doctor.          STOP taking these medications    fluconazole 150 MG tablet Commonly known as: DIFLUCAN Stopped by: Chesley Mires, MD       TAKE these medications    albuterol 108 (90 Base) MCG/ACT inhaler Commonly known as: VENTOLIN HFA 2 puff as needed   Budesonide 90 MCG/ACT inhaler Inhale 1 puff into the lungs 2 (two) times daily.   CALCIUM & MAGNESIUM CARBONATES PO 500 mg   Elderberry Zinc/Vit C/Immune Lozg See admin instructions.   famotidine 20  MG tablet Commonly known as: PEPCID 1 tablet at bedtime as needed   hydroxypropyl methylcellulose / hypromellose 2.5 % ophthalmic solution Commonly known as: ISOPTO TEARS / GONIOVISC Place 1 drop into both eyes 4 (four) times daily.   irbesartan-hydrochlorothiazide 300-12.5 MG tablet Commonly known as: AVALIDE 1 tablet What changed: Another medication with the same name was removed. Continue taking this medication, and follow the directions you see here. Changed by: Chesley Mires, MD   RED YEAST RICE EXTRACT PO Take 1 tablet by mouth daily.   UNABLE TO FIND 1 tablet daily. Vit B12 B6   WOMENS 50+ MULTI VITAMIN/MIN PO Take 1 tablet by mouth daily.        Signature:  Chesley Mires, MD [place] Pager - [phone] 10/23/2021, 2:56 PM

## 2021-10-23 NOTE — Patient Instructions (Signed)
Pneumonia 20 vaccine today  Follow up in 1 year

## 2021-12-04 ENCOUNTER — Ambulatory Visit
Admission: RE | Admit: 2021-12-04 | Discharge: 2021-12-04 | Disposition: A | Discharge status: Home / Self Care | Payer: Medicare Other | Source: Ambulatory Visit | Attending: Family Medicine | Admitting: Family Medicine

## 2021-12-04 DIAGNOSIS — M8589 Other specified disorders of bone density and structure, multiple sites: Secondary | ICD-10-CM | POA: Diagnosis not present

## 2021-12-04 DIAGNOSIS — E2839 Other primary ovarian failure: Secondary | ICD-10-CM

## 2021-12-04 DIAGNOSIS — Z780 Asymptomatic menopausal state: Secondary | ICD-10-CM | POA: Diagnosis not present

## 2021-12-27 DIAGNOSIS — E559 Vitamin D deficiency, unspecified: Secondary | ICD-10-CM | POA: Diagnosis not present

## 2021-12-27 DIAGNOSIS — I10 Essential (primary) hypertension: Secondary | ICD-10-CM | POA: Diagnosis not present

## 2021-12-27 DIAGNOSIS — Z0000 Encounter for general adult medical examination without abnormal findings: Secondary | ICD-10-CM | POA: Diagnosis not present

## 2021-12-27 DIAGNOSIS — E7800 Pure hypercholesterolemia, unspecified: Secondary | ICD-10-CM | POA: Diagnosis not present

## 2021-12-27 DIAGNOSIS — M8580 Other specified disorders of bone density and structure, unspecified site: Secondary | ICD-10-CM | POA: Diagnosis not present

## 2021-12-27 DIAGNOSIS — J4520 Mild intermittent asthma, uncomplicated: Secondary | ICD-10-CM | POA: Diagnosis not present

## 2021-12-27 DIAGNOSIS — Z853 Personal history of malignant neoplasm of breast: Secondary | ICD-10-CM | POA: Diagnosis not present

## 2021-12-27 DIAGNOSIS — Z23 Encounter for immunization: Secondary | ICD-10-CM | POA: Diagnosis not present

## 2021-12-27 DIAGNOSIS — E1169 Type 2 diabetes mellitus with other specified complication: Secondary | ICD-10-CM | POA: Diagnosis not present

## 2021-12-27 DIAGNOSIS — K219 Gastro-esophageal reflux disease without esophagitis: Secondary | ICD-10-CM | POA: Diagnosis not present

## 2022-02-11 DIAGNOSIS — Z08 Encounter for follow-up examination after completed treatment for malignant neoplasm: Secondary | ICD-10-CM | POA: Diagnosis not present

## 2022-02-11 DIAGNOSIS — Z1283 Encounter for screening for malignant neoplasm of skin: Secondary | ICD-10-CM | POA: Diagnosis not present

## 2022-02-11 DIAGNOSIS — X32XXXD Exposure to sunlight, subsequent encounter: Secondary | ICD-10-CM | POA: Diagnosis not present

## 2022-02-11 DIAGNOSIS — D225 Melanocytic nevi of trunk: Secondary | ICD-10-CM | POA: Diagnosis not present

## 2022-02-11 DIAGNOSIS — L570 Actinic keratosis: Secondary | ICD-10-CM | POA: Diagnosis not present

## 2022-02-11 DIAGNOSIS — Z85828 Personal history of other malignant neoplasm of skin: Secondary | ICD-10-CM | POA: Diagnosis not present

## 2022-02-19 DIAGNOSIS — N3000 Acute cystitis without hematuria: Secondary | ICD-10-CM | POA: Diagnosis not present

## 2022-03-25 DIAGNOSIS — D040 Carcinoma in situ of skin of lip: Secondary | ICD-10-CM | POA: Diagnosis not present

## 2022-04-16 DIAGNOSIS — X32XXXD Exposure to sunlight, subsequent encounter: Secondary | ICD-10-CM | POA: Diagnosis not present

## 2022-04-16 DIAGNOSIS — L570 Actinic keratosis: Secondary | ICD-10-CM | POA: Diagnosis not present

## 2022-04-16 DIAGNOSIS — C4402 Squamous cell carcinoma of skin of lip: Secondary | ICD-10-CM | POA: Diagnosis not present

## 2022-05-20 DIAGNOSIS — X32XXXD Exposure to sunlight, subsequent encounter: Secondary | ICD-10-CM | POA: Diagnosis not present

## 2022-05-20 DIAGNOSIS — L570 Actinic keratosis: Secondary | ICD-10-CM | POA: Diagnosis not present

## 2022-06-23 ENCOUNTER — Other Ambulatory Visit: Payer: Self-pay | Admitting: Family Medicine

## 2022-06-23 DIAGNOSIS — Z1231 Encounter for screening mammogram for malignant neoplasm of breast: Secondary | ICD-10-CM

## 2022-07-01 DIAGNOSIS — Z853 Personal history of malignant neoplasm of breast: Secondary | ICD-10-CM | POA: Diagnosis not present

## 2022-07-01 DIAGNOSIS — L720 Epidermal cyst: Secondary | ICD-10-CM | POA: Diagnosis not present

## 2022-07-01 DIAGNOSIS — L568 Other specified acute skin changes due to ultraviolet radiation: Secondary | ICD-10-CM | POA: Diagnosis not present

## 2022-07-01 DIAGNOSIS — E7800 Pure hypercholesterolemia, unspecified: Secondary | ICD-10-CM | POA: Diagnosis not present

## 2022-07-01 DIAGNOSIS — L218 Other seborrheic dermatitis: Secondary | ICD-10-CM | POA: Diagnosis not present

## 2022-07-01 DIAGNOSIS — K219 Gastro-esophageal reflux disease without esophagitis: Secondary | ICD-10-CM | POA: Diagnosis not present

## 2022-07-01 DIAGNOSIS — R413 Other amnesia: Secondary | ICD-10-CM | POA: Diagnosis not present

## 2022-07-01 DIAGNOSIS — D225 Melanocytic nevi of trunk: Secondary | ICD-10-CM | POA: Diagnosis not present

## 2022-07-01 DIAGNOSIS — E1169 Type 2 diabetes mellitus with other specified complication: Secondary | ICD-10-CM | POA: Diagnosis not present

## 2022-07-01 DIAGNOSIS — I10 Essential (primary) hypertension: Secondary | ICD-10-CM | POA: Diagnosis not present

## 2022-07-14 ENCOUNTER — Ambulatory Visit
Admission: RE | Admit: 2022-07-14 | Discharge: 2022-07-14 | Disposition: A | Discharge status: Home / Self Care | Payer: Medicare Other | Source: Ambulatory Visit | Attending: Family Medicine | Admitting: Family Medicine

## 2022-07-14 DIAGNOSIS — Z1231 Encounter for screening mammogram for malignant neoplasm of breast: Secondary | ICD-10-CM

## 2022-07-30 DIAGNOSIS — D040 Carcinoma in situ of skin of lip: Secondary | ICD-10-CM | POA: Diagnosis not present

## 2022-07-30 DIAGNOSIS — L568 Other specified acute skin changes due to ultraviolet radiation: Secondary | ICD-10-CM | POA: Diagnosis not present

## 2022-09-03 DIAGNOSIS — E119 Type 2 diabetes mellitus without complications: Secondary | ICD-10-CM | POA: Diagnosis not present

## 2022-09-03 DIAGNOSIS — Z961 Presence of intraocular lens: Secondary | ICD-10-CM | POA: Diagnosis not present

## 2022-09-03 DIAGNOSIS — H353131 Nonexudative age-related macular degeneration, bilateral, early dry stage: Secondary | ICD-10-CM | POA: Diagnosis not present

## 2022-09-03 DIAGNOSIS — H18513 Endothelial corneal dystrophy, bilateral: Secondary | ICD-10-CM | POA: Diagnosis not present

## 2022-09-03 DIAGNOSIS — H35033 Hypertensive retinopathy, bilateral: Secondary | ICD-10-CM | POA: Diagnosis not present

## 2022-10-21 DIAGNOSIS — U071 COVID-19: Secondary | ICD-10-CM | POA: Diagnosis not present

## 2022-10-28 DIAGNOSIS — L568 Other specified acute skin changes due to ultraviolet radiation: Secondary | ICD-10-CM | POA: Diagnosis not present

## 2022-10-28 DIAGNOSIS — L905 Scar conditions and fibrosis of skin: Secondary | ICD-10-CM | POA: Diagnosis not present

## 2022-11-13 ENCOUNTER — Ambulatory Visit (INDEPENDENT_AMBULATORY_CARE_PROVIDER_SITE_OTHER): Payer: Medicare Other | Admitting: Pulmonary Disease

## 2022-11-13 ENCOUNTER — Encounter (HOSPITAL_BASED_OUTPATIENT_CLINIC_OR_DEPARTMENT_OTHER): Payer: Self-pay | Admitting: Pulmonary Disease

## 2022-11-13 VITALS — BP 138/62 | HR 89 | Resp 15 | Ht 65.0 in | Wt 172.0 lb

## 2022-11-13 DIAGNOSIS — R0609 Other forms of dyspnea: Secondary | ICD-10-CM

## 2022-11-13 DIAGNOSIS — J4520 Mild intermittent asthma, uncomplicated: Secondary | ICD-10-CM

## 2022-11-13 NOTE — Patient Instructions (Signed)
You can try using albuterol before doing activities.  Follow up in 1 year.

## 2022-11-13 NOTE — Progress Notes (Signed)
[facility]  Chief Complaint  Patient presents with   Follow-up    1 year follow up     Past Surgical History:  She  has a past surgical history that includes Partial hysterectomy (1983); Umbilical hernia repair (1974); Breast surgery (2002); Appendectomy (1974); Colonoscopy; Breast lumpectomy with needle localization (Right, 05/08/2014); Re-excision of breast lumpectomy (Right, 05/15/2014); Cataract extraction (Bilateral, 2009); dental inplant (2015); Breast biopsy; and Breast lumpectomy (Bilateral).  Past Medical History:  Lt Breast cancer, DM type 2, GERD, Migraine headaches, HTN, Pneumonia October 2022  Constitutional:  BP 138/62   Pulse 89   Resp 15   Ht 5\' 5"  (1.651 m)   Wt 172 lb (78 kg)   SpO2 95%   BMI 28.62 kg/m   Brief Summary:  Debbie Joseph is a 82 y.o. female with chronic cough.      Subjective:   She gets winded easily with certain activities - walking up a hill or stairs.  She recovers after resting for a minute.  She had COVID several weeks ago and had more cough and congestion.  Was using albuterol more then, and it helped.  Hadn't needed this much more recently.  Uses nasonex at night.  Budesonide inhaler was very expensive, and didn't seem to help much.  Sleeping okay.  Denies chest pain or palpitations.  Physical Exam:   Appearance - well kempt   ENMT - no sinus tenderness, no oral exudate, no LAN, Mallampati 3 airway, no stridor  Respiratory - equal breath sounds bilaterally, no wheezing or rales  CV - s1s2 regular rate and rhythm, no murmurs  Ext - no clubbing, no edema  Skin - no rashes  Psych - normal mood and affect     Pulmonary testing:  FeNO 08/21/21 >> 8 IgE 08/21/21 >> 27 PFT 10/03/21 >> FEV1 2.10 (103%), FEV1% 83, TLC 3.87 (75%), DLCO 85%  Chest Imaging:    Cardiac Tests:  Echo 12/15/13 >> EF 60 to 65%, grade 1 DD  Social History:  She  reports that she has never smoked. She has never  used smokeless tobacco. She reports that she does not drink alcohol and does not use drugs.  Family History:  Her family history includes Breast cancer (age of onset: 28) in her maternal aunt; Colon cancer in her maternal grandmother; Diabetes in her brother and sister; Hypertension in her brother, maternal aunt, maternal grandfather, maternal uncle, and mother; Leukemia (age of onset: 61) in her sister; Lung cancer (age of onset: 68) in her father; Stroke (age of onset: 17) in her maternal grandfather; Stroke (age of onset: 37) in her mother; Tuberculosis in her paternal grandmother; Urolithiasis in her brother.     Assessment/Plan:   Mild, intermittent asthma. - budesonide was too expensive - prn albuterol  Dyspnea on exertion. - seems more related to deconditioning - advised she could try using albuterol before activity, and if this helps then call to discuss maintenance asthma therapy - explained she might need repeat cardiac assessment if her symptoms progress  Chronic rhinitis. - continue nasonex at night  Time Spent Involved in Patient Care on Day of Examination:  25 minutes  Follow up:   Patient Instructions  You can try using albuterol before doing activities.  Follow up in 1 year.  Medication List:   Allergies as of 11/13/2022       Reactions   Levofloxacin Other (See Comments)   Patient gets joint pain  Evista [raloxifene] Other (See Comments)   Made joints hurt "really bad"        Medication List        Accurate as of November 13, 2022 11:58 AM. If you have any questions, ask your nurse or doctor.          STOP taking these medications    Budesonide 90 MCG/ACT inhaler Stopped by: Cleophas Yoak   famotidine 20 MG tablet Commonly known as: PEPCID Stopped by: Tekeyah Santiago   UNABLE TO FIND Stopped by: Coralyn Helling       TAKE these medications    albuterol 108 (90 Base) MCG/ACT inhaler Commonly known as: VENTOLIN HFA 2 puff as needed    ascorbic acid 500 MG tablet Commonly known as: VITAMIN C Take by mouth.   CALCIUM & MAGNESIUM CARBONATES PO 500 mg   Cholecalciferol 250 MCG (10000 UT) Caps as directed Orally   Elderberry Zinc/Vit C/Immune Lozg See admin instructions.   hydroxypropyl methylcellulose / hypromellose 2.5 % ophthalmic solution Commonly known as: ISOPTO TEARS / GONIOVISC Place 1 drop into both eyes 4 (four) times daily.   irbesartan-hydrochlorothiazide 300-12.5 MG tablet Commonly known as: AVALIDE 1 tablet   mometasone 50 MCG/ACT nasal spray Commonly known as: NASONEX Place 1 spray into the nose at bedtime.   omeprazole 20 MG capsule Commonly known as: PRILOSEC 1 capsule 30 minutes before morning meal Orally Once a day   OneTouch Verio test strip Generic drug: glucose blood SMARTSIG:Strip(s) Via Meter Daily   RED YEAST RICE EXTRACT PO Take 1 tablet by mouth daily.   vitamin B-12 100 MCG tablet Commonly known as: CYANOCOBALAMIN Take 100 mcg by mouth daily.   WOMENS 50+ MULTI VITAMIN/MIN PO Take 1 tablet by mouth daily.        Signature:  Coralyn Helling, MD [facility] Pager - [phone] 11/13/2022, 11:58 AM

## 2022-11-18 DIAGNOSIS — L570 Actinic keratosis: Secondary | ICD-10-CM | POA: Diagnosis not present

## 2022-11-18 DIAGNOSIS — X32XXXA Exposure to sunlight, initial encounter: Secondary | ICD-10-CM | POA: Diagnosis not present

## 2022-12-19 ENCOUNTER — Other Ambulatory Visit: Payer: Self-pay | Admitting: Medical Genetics

## 2022-12-19 DIAGNOSIS — Z006 Encounter for examination for normal comparison and control in clinical research program: Secondary | ICD-10-CM

## 2023-01-09 DIAGNOSIS — Z853 Personal history of malignant neoplasm of breast: Secondary | ICD-10-CM | POA: Diagnosis not present

## 2023-01-09 DIAGNOSIS — Z0000 Encounter for general adult medical examination without abnormal findings: Secondary | ICD-10-CM | POA: Diagnosis not present

## 2023-01-09 DIAGNOSIS — J4520 Mild intermittent asthma, uncomplicated: Secondary | ICD-10-CM | POA: Diagnosis not present

## 2023-01-09 DIAGNOSIS — E1169 Type 2 diabetes mellitus with other specified complication: Secondary | ICD-10-CM | POA: Diagnosis not present

## 2023-01-09 DIAGNOSIS — Z23 Encounter for immunization: Secondary | ICD-10-CM | POA: Diagnosis not present

## 2023-01-09 DIAGNOSIS — K219 Gastro-esophageal reflux disease without esophagitis: Secondary | ICD-10-CM | POA: Diagnosis not present

## 2023-01-09 DIAGNOSIS — I700 Atherosclerosis of aorta: Secondary | ICD-10-CM | POA: Diagnosis not present

## 2023-01-09 DIAGNOSIS — M2559 Pain in other specified joint: Secondary | ICD-10-CM | POA: Diagnosis not present

## 2023-01-09 DIAGNOSIS — I10 Essential (primary) hypertension: Secondary | ICD-10-CM | POA: Diagnosis not present

## 2023-01-09 DIAGNOSIS — E7800 Pure hypercholesterolemia, unspecified: Secondary | ICD-10-CM | POA: Diagnosis not present

## 2023-01-09 DIAGNOSIS — M62838 Other muscle spasm: Secondary | ICD-10-CM | POA: Diagnosis not present

## 2023-01-13 DIAGNOSIS — L568 Other specified acute skin changes due to ultraviolet radiation: Secondary | ICD-10-CM | POA: Diagnosis not present

## 2023-02-24 DIAGNOSIS — L568 Other specified acute skin changes due to ultraviolet radiation: Secondary | ICD-10-CM | POA: Diagnosis not present

## 2023-03-24 DIAGNOSIS — L570 Actinic keratosis: Secondary | ICD-10-CM | POA: Diagnosis not present

## 2023-03-24 DIAGNOSIS — X32XXXD Exposure to sunlight, subsequent encounter: Secondary | ICD-10-CM | POA: Diagnosis not present

## 2023-04-24 DIAGNOSIS — X32XXXD Exposure to sunlight, subsequent encounter: Secondary | ICD-10-CM | POA: Diagnosis not present

## 2023-04-24 DIAGNOSIS — L570 Actinic keratosis: Secondary | ICD-10-CM | POA: Diagnosis not present

## 2023-05-13 ENCOUNTER — Other Ambulatory Visit: Payer: Self-pay | Admitting: Family Medicine

## 2023-05-13 DIAGNOSIS — Z1231 Encounter for screening mammogram for malignant neoplasm of breast: Secondary | ICD-10-CM

## 2023-06-05 DIAGNOSIS — X32XXXD Exposure to sunlight, subsequent encounter: Secondary | ICD-10-CM | POA: Diagnosis not present

## 2023-06-05 DIAGNOSIS — L570 Actinic keratosis: Secondary | ICD-10-CM | POA: Diagnosis not present

## 2023-06-26 DIAGNOSIS — E1169 Type 2 diabetes mellitus with other specified complication: Secondary | ICD-10-CM | POA: Diagnosis not present

## 2023-06-26 DIAGNOSIS — I10 Essential (primary) hypertension: Secondary | ICD-10-CM | POA: Diagnosis not present

## 2023-06-26 DIAGNOSIS — I700 Atherosclerosis of aorta: Secondary | ICD-10-CM | POA: Diagnosis not present

## 2023-07-07 DIAGNOSIS — L568 Other specified acute skin changes due to ultraviolet radiation: Secondary | ICD-10-CM | POA: Diagnosis not present

## 2023-07-08 DIAGNOSIS — Z853 Personal history of malignant neoplasm of breast: Secondary | ICD-10-CM | POA: Diagnosis not present

## 2023-07-08 DIAGNOSIS — K219 Gastro-esophageal reflux disease without esophagitis: Secondary | ICD-10-CM | POA: Diagnosis not present

## 2023-07-08 DIAGNOSIS — J4520 Mild intermittent asthma, uncomplicated: Secondary | ICD-10-CM | POA: Diagnosis not present

## 2023-07-08 DIAGNOSIS — E7800 Pure hypercholesterolemia, unspecified: Secondary | ICD-10-CM | POA: Diagnosis not present

## 2023-07-08 DIAGNOSIS — E1169 Type 2 diabetes mellitus with other specified complication: Secondary | ICD-10-CM | POA: Diagnosis not present

## 2023-07-08 DIAGNOSIS — I10 Essential (primary) hypertension: Secondary | ICD-10-CM | POA: Diagnosis not present

## 2023-07-16 ENCOUNTER — Other Ambulatory Visit (HOSPITAL_COMMUNITY)
Admission: RE | Admit: 2023-07-16 | Discharge: 2023-07-16 | Disposition: A | Discharge status: Home / Self Care | Payer: Self-pay | Source: Ambulatory Visit | Attending: Oncology | Admitting: Oncology

## 2023-07-16 ENCOUNTER — Other Ambulatory Visit (HOSPITAL_COMMUNITY): Payer: Self-pay

## 2023-07-16 ENCOUNTER — Ambulatory Visit
Admission: RE | Admit: 2023-07-16 | Discharge: 2023-07-16 | Disposition: A | Discharge status: Home / Self Care | Payer: Medicare Other | Source: Ambulatory Visit | Attending: Family Medicine | Admitting: Family Medicine

## 2023-07-16 DIAGNOSIS — Z006 Encounter for examination for normal comparison and control in clinical research program: Secondary | ICD-10-CM | POA: Insufficient documentation

## 2023-07-16 DIAGNOSIS — Z1231 Encounter for screening mammogram for malignant neoplasm of breast: Secondary | ICD-10-CM | POA: Diagnosis not present

## 2023-07-20 DIAGNOSIS — Z853 Personal history of malignant neoplasm of breast: Secondary | ICD-10-CM | POA: Diagnosis not present

## 2023-07-20 DIAGNOSIS — E1169 Type 2 diabetes mellitus with other specified complication: Secondary | ICD-10-CM | POA: Diagnosis not present

## 2023-07-20 DIAGNOSIS — J4520 Mild intermittent asthma, uncomplicated: Secondary | ICD-10-CM | POA: Diagnosis not present

## 2023-07-20 DIAGNOSIS — E7800 Pure hypercholesterolemia, unspecified: Secondary | ICD-10-CM | POA: Diagnosis not present

## 2023-07-20 DIAGNOSIS — I10 Essential (primary) hypertension: Secondary | ICD-10-CM | POA: Diagnosis not present

## 2023-07-20 DIAGNOSIS — I700 Atherosclerosis of aorta: Secondary | ICD-10-CM | POA: Diagnosis not present

## 2023-07-25 DIAGNOSIS — I10 Essential (primary) hypertension: Secondary | ICD-10-CM | POA: Diagnosis not present

## 2023-07-25 DIAGNOSIS — E1169 Type 2 diabetes mellitus with other specified complication: Secondary | ICD-10-CM | POA: Diagnosis not present

## 2023-07-25 DIAGNOSIS — I700 Atherosclerosis of aorta: Secondary | ICD-10-CM | POA: Diagnosis not present

## 2023-07-28 LAB — GENECONNECT MOLECULAR SCREEN: Genetic Analysis Overall Interpretation: NEGATIVE

## 2023-08-19 DIAGNOSIS — I700 Atherosclerosis of aorta: Secondary | ICD-10-CM | POA: Diagnosis not present

## 2023-08-19 DIAGNOSIS — J4520 Mild intermittent asthma, uncomplicated: Secondary | ICD-10-CM | POA: Diagnosis not present

## 2023-08-19 DIAGNOSIS — B0089 Other herpesviral infection: Secondary | ICD-10-CM | POA: Diagnosis not present

## 2023-08-19 DIAGNOSIS — Z853 Personal history of malignant neoplasm of breast: Secondary | ICD-10-CM | POA: Diagnosis not present

## 2023-08-19 DIAGNOSIS — I10 Essential (primary) hypertension: Secondary | ICD-10-CM | POA: Diagnosis not present

## 2023-08-19 DIAGNOSIS — E1169 Type 2 diabetes mellitus with other specified complication: Secondary | ICD-10-CM | POA: Diagnosis not present

## 2023-08-19 DIAGNOSIS — E7800 Pure hypercholesterolemia, unspecified: Secondary | ICD-10-CM | POA: Diagnosis not present

## 2023-08-19 DIAGNOSIS — L568 Other specified acute skin changes due to ultraviolet radiation: Secondary | ICD-10-CM | POA: Diagnosis not present

## 2023-08-24 DIAGNOSIS — I10 Essential (primary) hypertension: Secondary | ICD-10-CM | POA: Diagnosis not present

## 2023-08-24 DIAGNOSIS — I700 Atherosclerosis of aorta: Secondary | ICD-10-CM | POA: Diagnosis not present

## 2023-08-24 DIAGNOSIS — E1169 Type 2 diabetes mellitus with other specified complication: Secondary | ICD-10-CM | POA: Diagnosis not present

## 2023-09-04 DIAGNOSIS — E119 Type 2 diabetes mellitus without complications: Secondary | ICD-10-CM | POA: Diagnosis not present

## 2023-09-04 DIAGNOSIS — H18513 Endothelial corneal dystrophy, bilateral: Secondary | ICD-10-CM | POA: Diagnosis not present

## 2023-09-04 DIAGNOSIS — H35033 Hypertensive retinopathy, bilateral: Secondary | ICD-10-CM | POA: Diagnosis not present

## 2023-09-04 DIAGNOSIS — Z961 Presence of intraocular lens: Secondary | ICD-10-CM | POA: Diagnosis not present

## 2023-09-04 DIAGNOSIS — H353131 Nonexudative age-related macular degeneration, bilateral, early dry stage: Secondary | ICD-10-CM | POA: Diagnosis not present

## 2023-09-19 DIAGNOSIS — J4520 Mild intermittent asthma, uncomplicated: Secondary | ICD-10-CM | POA: Diagnosis not present

## 2023-09-19 DIAGNOSIS — I700 Atherosclerosis of aorta: Secondary | ICD-10-CM | POA: Diagnosis not present

## 2023-09-19 DIAGNOSIS — E7800 Pure hypercholesterolemia, unspecified: Secondary | ICD-10-CM | POA: Diagnosis not present

## 2023-09-19 DIAGNOSIS — E1169 Type 2 diabetes mellitus with other specified complication: Secondary | ICD-10-CM | POA: Diagnosis not present

## 2023-09-19 DIAGNOSIS — Z853 Personal history of malignant neoplasm of breast: Secondary | ICD-10-CM | POA: Diagnosis not present

## 2023-09-19 DIAGNOSIS — I10 Essential (primary) hypertension: Secondary | ICD-10-CM | POA: Diagnosis not present

## 2023-09-23 DIAGNOSIS — I700 Atherosclerosis of aorta: Secondary | ICD-10-CM | POA: Diagnosis not present

## 2023-09-23 DIAGNOSIS — I10 Essential (primary) hypertension: Secondary | ICD-10-CM | POA: Diagnosis not present

## 2023-09-23 DIAGNOSIS — E1169 Type 2 diabetes mellitus with other specified complication: Secondary | ICD-10-CM | POA: Diagnosis not present

## 2023-09-30 DIAGNOSIS — L568 Other specified acute skin changes due to ultraviolet radiation: Secondary | ICD-10-CM | POA: Diagnosis not present

## 2023-10-19 DIAGNOSIS — E1169 Type 2 diabetes mellitus with other specified complication: Secondary | ICD-10-CM | POA: Diagnosis not present

## 2023-10-19 DIAGNOSIS — I700 Atherosclerosis of aorta: Secondary | ICD-10-CM | POA: Diagnosis not present

## 2023-10-19 DIAGNOSIS — J4520 Mild intermittent asthma, uncomplicated: Secondary | ICD-10-CM | POA: Diagnosis not present

## 2023-10-19 DIAGNOSIS — E7800 Pure hypercholesterolemia, unspecified: Secondary | ICD-10-CM | POA: Diagnosis not present

## 2023-10-19 DIAGNOSIS — M25551 Pain in right hip: Secondary | ICD-10-CM | POA: Diagnosis not present

## 2023-10-19 DIAGNOSIS — I10 Essential (primary) hypertension: Secondary | ICD-10-CM | POA: Diagnosis not present

## 2023-10-19 DIAGNOSIS — Z853 Personal history of malignant neoplasm of breast: Secondary | ICD-10-CM | POA: Diagnosis not present

## 2023-10-23 DIAGNOSIS — I700 Atherosclerosis of aorta: Secondary | ICD-10-CM | POA: Diagnosis not present

## 2023-10-23 DIAGNOSIS — E1169 Type 2 diabetes mellitus with other specified complication: Secondary | ICD-10-CM | POA: Diagnosis not present

## 2023-10-23 DIAGNOSIS — I10 Essential (primary) hypertension: Secondary | ICD-10-CM | POA: Diagnosis not present

## 2023-10-30 DIAGNOSIS — M25551 Pain in right hip: Secondary | ICD-10-CM | POA: Diagnosis not present

## 2023-11-02 DIAGNOSIS — M25551 Pain in right hip: Secondary | ICD-10-CM | POA: Diagnosis not present

## 2023-11-04 DIAGNOSIS — M25551 Pain in right hip: Secondary | ICD-10-CM | POA: Diagnosis not present

## 2023-11-09 DIAGNOSIS — M25551 Pain in right hip: Secondary | ICD-10-CM | POA: Diagnosis not present

## 2023-11-11 DIAGNOSIS — M25551 Pain in right hip: Secondary | ICD-10-CM | POA: Diagnosis not present

## 2023-11-17 DIAGNOSIS — M25551 Pain in right hip: Secondary | ICD-10-CM | POA: Diagnosis not present

## 2023-11-19 DIAGNOSIS — Z853 Personal history of malignant neoplasm of breast: Secondary | ICD-10-CM | POA: Diagnosis not present

## 2023-11-19 DIAGNOSIS — J4520 Mild intermittent asthma, uncomplicated: Secondary | ICD-10-CM | POA: Diagnosis not present

## 2023-11-19 DIAGNOSIS — I700 Atherosclerosis of aorta: Secondary | ICD-10-CM | POA: Diagnosis not present

## 2023-11-19 DIAGNOSIS — E1169 Type 2 diabetes mellitus with other specified complication: Secondary | ICD-10-CM | POA: Diagnosis not present

## 2023-11-19 DIAGNOSIS — E7800 Pure hypercholesterolemia, unspecified: Secondary | ICD-10-CM | POA: Diagnosis not present

## 2023-11-19 DIAGNOSIS — I10 Essential (primary) hypertension: Secondary | ICD-10-CM | POA: Diagnosis not present

## 2023-11-20 DIAGNOSIS — M25551 Pain in right hip: Secondary | ICD-10-CM | POA: Diagnosis not present

## 2023-11-22 DIAGNOSIS — I700 Atherosclerosis of aorta: Secondary | ICD-10-CM | POA: Diagnosis not present

## 2023-11-22 DIAGNOSIS — E1169 Type 2 diabetes mellitus with other specified complication: Secondary | ICD-10-CM | POA: Diagnosis not present

## 2023-11-22 DIAGNOSIS — I10 Essential (primary) hypertension: Secondary | ICD-10-CM | POA: Diagnosis not present

## 2023-11-23 DIAGNOSIS — M25551 Pain in right hip: Secondary | ICD-10-CM | POA: Diagnosis not present

## 2023-11-25 DIAGNOSIS — M25551 Pain in right hip: Secondary | ICD-10-CM | POA: Diagnosis not present

## 2023-12-02 DIAGNOSIS — M25551 Pain in right hip: Secondary | ICD-10-CM | POA: Diagnosis not present

## 2023-12-03 DIAGNOSIS — M25551 Pain in right hip: Secondary | ICD-10-CM | POA: Diagnosis not present

## 2023-12-04 DIAGNOSIS — M25551 Pain in right hip: Secondary | ICD-10-CM | POA: Diagnosis not present

## 2023-12-07 DIAGNOSIS — M25551 Pain in right hip: Secondary | ICD-10-CM | POA: Diagnosis not present

## 2023-12-09 DIAGNOSIS — M25551 Pain in right hip: Secondary | ICD-10-CM | POA: Diagnosis not present

## 2023-12-14 DIAGNOSIS — M25551 Pain in right hip: Secondary | ICD-10-CM | POA: Diagnosis not present

## 2023-12-17 DIAGNOSIS — M25551 Pain in right hip: Secondary | ICD-10-CM | POA: Diagnosis not present

## 2023-12-20 DIAGNOSIS — I700 Atherosclerosis of aorta: Secondary | ICD-10-CM | POA: Diagnosis not present

## 2023-12-20 DIAGNOSIS — E1169 Type 2 diabetes mellitus with other specified complication: Secondary | ICD-10-CM | POA: Diagnosis not present

## 2023-12-20 DIAGNOSIS — Z853 Personal history of malignant neoplasm of breast: Secondary | ICD-10-CM | POA: Diagnosis not present

## 2023-12-20 DIAGNOSIS — J4520 Mild intermittent asthma, uncomplicated: Secondary | ICD-10-CM | POA: Diagnosis not present

## 2023-12-20 DIAGNOSIS — I10 Essential (primary) hypertension: Secondary | ICD-10-CM | POA: Diagnosis not present

## 2023-12-20 DIAGNOSIS — E7800 Pure hypercholesterolemia, unspecified: Secondary | ICD-10-CM | POA: Diagnosis not present

## 2023-12-22 DIAGNOSIS — I700 Atherosclerosis of aorta: Secondary | ICD-10-CM | POA: Diagnosis not present

## 2023-12-22 DIAGNOSIS — I10 Essential (primary) hypertension: Secondary | ICD-10-CM | POA: Diagnosis not present

## 2023-12-22 DIAGNOSIS — E1169 Type 2 diabetes mellitus with other specified complication: Secondary | ICD-10-CM | POA: Diagnosis not present

## 2023-12-25 DIAGNOSIS — M25551 Pain in right hip: Secondary | ICD-10-CM | POA: Diagnosis not present

## 2023-12-28 DIAGNOSIS — M25551 Pain in right hip: Secondary | ICD-10-CM | POA: Diagnosis not present

## 2023-12-31 DIAGNOSIS — M25511 Pain in right shoulder: Secondary | ICD-10-CM | POA: Diagnosis not present

## 2024-01-11 DIAGNOSIS — M25551 Pain in right hip: Secondary | ICD-10-CM | POA: Diagnosis not present

## 2024-01-14 DIAGNOSIS — M25551 Pain in right hip: Secondary | ICD-10-CM | POA: Diagnosis not present

## 2024-01-15 DIAGNOSIS — M25551 Pain in right hip: Secondary | ICD-10-CM | POA: Diagnosis not present

## 2024-01-18 DIAGNOSIS — M25551 Pain in right hip: Secondary | ICD-10-CM | POA: Diagnosis not present

## 2024-01-19 DIAGNOSIS — I10 Essential (primary) hypertension: Secondary | ICD-10-CM | POA: Diagnosis not present

## 2024-01-19 DIAGNOSIS — J4520 Mild intermittent asthma, uncomplicated: Secondary | ICD-10-CM | POA: Diagnosis not present

## 2024-01-19 DIAGNOSIS — I700 Atherosclerosis of aorta: Secondary | ICD-10-CM | POA: Diagnosis not present

## 2024-01-19 DIAGNOSIS — E1169 Type 2 diabetes mellitus with other specified complication: Secondary | ICD-10-CM | POA: Diagnosis not present

## 2024-01-19 DIAGNOSIS — Z853 Personal history of malignant neoplasm of breast: Secondary | ICD-10-CM | POA: Diagnosis not present

## 2024-01-19 DIAGNOSIS — E7800 Pure hypercholesterolemia, unspecified: Secondary | ICD-10-CM | POA: Diagnosis not present

## 2024-01-20 DIAGNOSIS — E1169 Type 2 diabetes mellitus with other specified complication: Secondary | ICD-10-CM | POA: Diagnosis not present

## 2024-01-20 DIAGNOSIS — I10 Essential (primary) hypertension: Secondary | ICD-10-CM | POA: Diagnosis not present

## 2024-01-20 DIAGNOSIS — Z23 Encounter for immunization: Secondary | ICD-10-CM | POA: Diagnosis not present

## 2024-01-20 DIAGNOSIS — K219 Gastro-esophageal reflux disease without esophagitis: Secondary | ICD-10-CM | POA: Diagnosis not present

## 2024-01-20 DIAGNOSIS — E7800 Pure hypercholesterolemia, unspecified: Secondary | ICD-10-CM | POA: Diagnosis not present

## 2024-01-20 DIAGNOSIS — M85851 Other specified disorders of bone density and structure, right thigh: Secondary | ICD-10-CM | POA: Diagnosis not present

## 2024-01-20 DIAGNOSIS — M62838 Other muscle spasm: Secondary | ICD-10-CM | POA: Diagnosis not present

## 2024-01-20 DIAGNOSIS — Z853 Personal history of malignant neoplasm of breast: Secondary | ICD-10-CM | POA: Diagnosis not present

## 2024-01-20 DIAGNOSIS — J4520 Mild intermittent asthma, uncomplicated: Secondary | ICD-10-CM | POA: Diagnosis not present

## 2024-01-20 DIAGNOSIS — Z0000 Encounter for general adult medical examination without abnormal findings: Secondary | ICD-10-CM | POA: Diagnosis not present

## 2024-01-21 DIAGNOSIS — I10 Essential (primary) hypertension: Secondary | ICD-10-CM | POA: Diagnosis not present

## 2024-01-21 DIAGNOSIS — E1169 Type 2 diabetes mellitus with other specified complication: Secondary | ICD-10-CM | POA: Diagnosis not present

## 2024-01-21 DIAGNOSIS — I700 Atherosclerosis of aorta: Secondary | ICD-10-CM | POA: Diagnosis not present

## 2024-01-24 ENCOUNTER — Other Ambulatory Visit (HOSPITAL_BASED_OUTPATIENT_CLINIC_OR_DEPARTMENT_OTHER): Payer: Self-pay | Admitting: Family Medicine

## 2024-01-24 DIAGNOSIS — M85851 Other specified disorders of bone density and structure, right thigh: Secondary | ICD-10-CM

## 2024-01-25 DIAGNOSIS — M25551 Pain in right hip: Secondary | ICD-10-CM | POA: Diagnosis not present

## 2024-02-02 DIAGNOSIS — M25551 Pain in right hip: Secondary | ICD-10-CM | POA: Diagnosis not present

## 2024-02-05 DIAGNOSIS — L568 Other specified acute skin changes due to ultraviolet radiation: Secondary | ICD-10-CM | POA: Diagnosis not present

## 2024-02-09 DIAGNOSIS — M25551 Pain in right hip: Secondary | ICD-10-CM | POA: Diagnosis not present

## 2024-02-16 DIAGNOSIS — M25551 Pain in right hip: Secondary | ICD-10-CM | POA: Diagnosis not present

## 2024-02-19 DIAGNOSIS — I700 Atherosclerosis of aorta: Secondary | ICD-10-CM | POA: Diagnosis not present

## 2024-02-19 DIAGNOSIS — Z853 Personal history of malignant neoplasm of breast: Secondary | ICD-10-CM | POA: Diagnosis not present

## 2024-02-19 DIAGNOSIS — I10 Essential (primary) hypertension: Secondary | ICD-10-CM | POA: Diagnosis not present

## 2024-02-19 DIAGNOSIS — J4520 Mild intermittent asthma, uncomplicated: Secondary | ICD-10-CM | POA: Diagnosis not present

## 2024-02-19 DIAGNOSIS — E7800 Pure hypercholesterolemia, unspecified: Secondary | ICD-10-CM | POA: Diagnosis not present

## 2024-02-19 DIAGNOSIS — E1169 Type 2 diabetes mellitus with other specified complication: Secondary | ICD-10-CM | POA: Diagnosis not present

## 2024-02-20 DIAGNOSIS — E1169 Type 2 diabetes mellitus with other specified complication: Secondary | ICD-10-CM | POA: Diagnosis not present

## 2024-02-20 DIAGNOSIS — I10 Essential (primary) hypertension: Secondary | ICD-10-CM | POA: Diagnosis not present

## 2024-02-20 DIAGNOSIS — I700 Atherosclerosis of aorta: Secondary | ICD-10-CM | POA: Diagnosis not present

## 2024-02-23 DIAGNOSIS — M25551 Pain in right hip: Secondary | ICD-10-CM | POA: Diagnosis not present

## 2024-03-01 DIAGNOSIS — M25551 Pain in right hip: Secondary | ICD-10-CM | POA: Diagnosis not present

## 2024-03-08 DIAGNOSIS — M25551 Pain in right hip: Secondary | ICD-10-CM | POA: Diagnosis not present

## 2024-03-15 ENCOUNTER — Other Ambulatory Visit (HOSPITAL_COMMUNITY): Payer: Self-pay | Admitting: Sports Medicine

## 2024-03-15 DIAGNOSIS — M25551 Pain in right hip: Secondary | ICD-10-CM

## 2024-03-20 DIAGNOSIS — E7800 Pure hypercholesterolemia, unspecified: Secondary | ICD-10-CM | POA: Diagnosis not present

## 2024-03-20 DIAGNOSIS — J4520 Mild intermittent asthma, uncomplicated: Secondary | ICD-10-CM | POA: Diagnosis not present

## 2024-03-20 DIAGNOSIS — I700 Atherosclerosis of aorta: Secondary | ICD-10-CM | POA: Diagnosis not present

## 2024-03-20 DIAGNOSIS — E1169 Type 2 diabetes mellitus with other specified complication: Secondary | ICD-10-CM | POA: Diagnosis not present

## 2024-03-20 DIAGNOSIS — I10 Essential (primary) hypertension: Secondary | ICD-10-CM | POA: Diagnosis not present

## 2024-03-20 DIAGNOSIS — Z853 Personal history of malignant neoplasm of breast: Secondary | ICD-10-CM | POA: Diagnosis not present

## 2024-03-22 ENCOUNTER — Ambulatory Visit (HOSPITAL_COMMUNITY)
Admission: RE | Admit: 2024-03-22 | Discharge: 2024-03-22 | Disposition: A | Discharge status: Home / Self Care | Source: Ambulatory Visit | Attending: Sports Medicine | Admitting: Sports Medicine

## 2024-03-22 DIAGNOSIS — M25551 Pain in right hip: Secondary | ICD-10-CM | POA: Diagnosis not present

## 2024-03-22 DIAGNOSIS — M7601 Gluteal tendinitis, right hip: Secondary | ICD-10-CM | POA: Diagnosis not present

## 2024-03-22 DIAGNOSIS — S73191A Other sprain of right hip, initial encounter: Secondary | ICD-10-CM | POA: Diagnosis not present

## 2024-03-22 DIAGNOSIS — Z853 Personal history of malignant neoplasm of breast: Secondary | ICD-10-CM | POA: Diagnosis not present

## 2024-03-22 DIAGNOSIS — M47816 Spondylosis without myelopathy or radiculopathy, lumbar region: Secondary | ICD-10-CM | POA: Diagnosis not present

## 2024-03-29 DIAGNOSIS — M1611 Unilateral primary osteoarthritis, right hip: Secondary | ICD-10-CM | POA: Diagnosis not present

## 2024-08-11 ENCOUNTER — Other Ambulatory Visit (HOSPITAL_BASED_OUTPATIENT_CLINIC_OR_DEPARTMENT_OTHER)
